# Patient Record
Sex: Female | Born: 1960 | Race: Black or African American | Hispanic: No | Marital: Married | Smoking: Never smoker
Health system: Southern US, Community
[De-identification: ages and names within clinical notes are randomized; demographics above are authoritative.]

## PROBLEM LIST (undated history)

## (undated) DIAGNOSIS — J309 Allergic rhinitis, unspecified: Secondary | ICD-10-CM

## (undated) DIAGNOSIS — Z8632 Personal history of gestational diabetes: Secondary | ICD-10-CM

## (undated) DIAGNOSIS — N841 Polyp of cervix uteri: Secondary | ICD-10-CM

## (undated) DIAGNOSIS — Z973 Presence of spectacles and contact lenses: Secondary | ICD-10-CM

## (undated) DIAGNOSIS — E119 Type 2 diabetes mellitus without complications: Secondary | ICD-10-CM

## (undated) DIAGNOSIS — D649 Anemia, unspecified: Secondary | ICD-10-CM

## (undated) DIAGNOSIS — D259 Leiomyoma of uterus, unspecified: Secondary | ICD-10-CM

## (undated) DIAGNOSIS — I10 Essential (primary) hypertension: Secondary | ICD-10-CM

## (undated) DIAGNOSIS — T7840XA Allergy, unspecified, initial encounter: Secondary | ICD-10-CM

## (undated) DIAGNOSIS — E785 Hyperlipidemia, unspecified: Secondary | ICD-10-CM

## (undated) DIAGNOSIS — O24419 Gestational diabetes mellitus in pregnancy, unspecified control: Secondary | ICD-10-CM

## (undated) HISTORY — DX: Hyperlipidemia, unspecified: E78.5

## (undated) HISTORY — DX: Leiomyoma of uterus, unspecified: D25.9

## (undated) HISTORY — DX: Allergy, unspecified, initial encounter: T78.40XA

## (undated) HISTORY — DX: Essential (primary) hypertension: I10

## (undated) HISTORY — DX: Type 2 diabetes mellitus without complications: E11.9

## (undated) HISTORY — DX: Anemia, unspecified: D64.9

## (undated) HISTORY — PX: LUMBAR SPINE SURGERY: SHX701

## (undated) HISTORY — DX: Gestational diabetes mellitus in pregnancy, unspecified control: O24.419

## (undated) HISTORY — PX: LIPOMA EXCISION: SHX5283

## (undated) HISTORY — PX: BREAST SURGERY: SHX581

## (undated) HISTORY — PX: SPINE SURGERY: SHX786

---
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
## 1988-01-15 HISTORY — PX: LIPOMA EXCISION: SHX5283

## 1990-01-14 HISTORY — PX: LUMBAR SPINE SURGERY: SHX701

## 1998-11-27 ENCOUNTER — Other Ambulatory Visit: Admission: RE | Admit: 1998-11-27 | Discharge: 1998-11-27 | Payer: Self-pay | Admitting: Obstetrics and Gynecology

## 2000-09-19 ENCOUNTER — Encounter (INDEPENDENT_AMBULATORY_CARE_PROVIDER_SITE_OTHER): Payer: Self-pay | Admitting: *Deleted

## 2000-09-19 ENCOUNTER — Ambulatory Visit (HOSPITAL_BASED_OUTPATIENT_CLINIC_OR_DEPARTMENT_OTHER): Admission: RE | Admit: 2000-09-19 | Discharge: 2000-09-19 | Payer: Self-pay | Admitting: General Surgery

## 2001-08-07 ENCOUNTER — Other Ambulatory Visit: Admission: RE | Admit: 2001-08-07 | Discharge: 2001-08-07 | Payer: Self-pay | Admitting: Obstetrics and Gynecology

## 2003-01-18 ENCOUNTER — Other Ambulatory Visit: Admission: RE | Admit: 2003-01-18 | Discharge: 2003-01-18 | Payer: Self-pay | Admitting: Obstetrics and Gynecology

## 2003-05-16 ENCOUNTER — Encounter: Admission: RE | Admit: 2003-05-16 | Discharge: 2003-05-16 | Payer: Self-pay | Admitting: Obstetrics and Gynecology

## 2004-05-17 ENCOUNTER — Other Ambulatory Visit: Admission: RE | Admit: 2004-05-17 | Discharge: 2004-05-17 | Payer: Self-pay | Admitting: Obstetrics and Gynecology

## 2005-01-25 ENCOUNTER — Emergency Department (HOSPITAL_COMMUNITY): Admission: EM | Admit: 2005-01-25 | Discharge: 2005-01-25 | Payer: Self-pay

## 2006-09-19 ENCOUNTER — Encounter: Admission: RE | Admit: 2006-09-19 | Discharge: 2006-09-19 | Payer: Self-pay | Admitting: Internal Medicine

## 2011-02-01 ENCOUNTER — Encounter: Payer: Self-pay | Admitting: Physician Assistant

## 2011-02-01 DIAGNOSIS — D259 Leiomyoma of uterus, unspecified: Secondary | ICD-10-CM | POA: Insufficient documentation

## 2011-02-01 DIAGNOSIS — E669 Obesity, unspecified: Secondary | ICD-10-CM | POA: Insufficient documentation

## 2011-02-01 DIAGNOSIS — E785 Hyperlipidemia, unspecified: Secondary | ICD-10-CM | POA: Insufficient documentation

## 2011-02-01 DIAGNOSIS — E119 Type 2 diabetes mellitus without complications: Secondary | ICD-10-CM | POA: Insufficient documentation

## 2011-02-01 DIAGNOSIS — I10 Essential (primary) hypertension: Secondary | ICD-10-CM

## 2011-02-14 ENCOUNTER — Other Ambulatory Visit: Payer: Self-pay | Admitting: Physician Assistant

## 2011-02-25 ENCOUNTER — Other Ambulatory Visit: Payer: Self-pay | Admitting: Physician Assistant

## 2011-03-19 ENCOUNTER — Other Ambulatory Visit: Payer: Self-pay | Admitting: Physician Assistant

## 2011-04-25 ENCOUNTER — Ambulatory Visit: Payer: Self-pay | Admitting: Physician Assistant

## 2011-04-26 ENCOUNTER — Telehealth: Payer: Self-pay

## 2011-04-26 DIAGNOSIS — N63 Unspecified lump in unspecified breast: Secondary | ICD-10-CM

## 2011-04-26 NOTE — Telephone Encounter (Signed)
Chelle, do you want to change this to a referral for Dx mammogram/US? Pt's chart is in your box

## 2011-04-26 NOTE — Telephone Encounter (Signed)
Revonda Standard from [facility] states that pt is stating that about a month ago she discovered a lump in her breast, they now would like for [place] to change the current order for screening into a order for a diagnostic with ultrasound. [phone]  [phone]

## 2011-04-29 NOTE — Telephone Encounter (Signed)
Yes, ok to change to diagnostic mammo with [place].

## 2011-04-29 NOTE — Telephone Encounter (Signed)
Spoke with Stephanie Nunez-told her it was ok to change the order per Chelle.

## 2011-05-11 ENCOUNTER — Other Ambulatory Visit: Payer: Self-pay | Admitting: Physician Assistant

## 2011-05-18 ENCOUNTER — Other Ambulatory Visit: Payer: Self-pay | Admitting: Physician Assistant

## 2011-05-24 ENCOUNTER — Other Ambulatory Visit: Payer: Self-pay | Admitting: Physician Assistant

## 2011-05-25 ENCOUNTER — Other Ambulatory Visit: Payer: Self-pay | Admitting: Physician Assistant

## 2011-05-30 ENCOUNTER — Ambulatory Visit: Payer: Self-pay | Admitting: Physician Assistant

## 2011-06-13 ENCOUNTER — Ambulatory Visit: Payer: Self-pay | Admitting: Physician Assistant

## 2011-06-29 ENCOUNTER — Other Ambulatory Visit: Payer: Self-pay | Admitting: Physician Assistant

## 2011-07-11 ENCOUNTER — Encounter: Payer: Self-pay | Admitting: Physician Assistant

## 2011-07-11 ENCOUNTER — Ambulatory Visit (INDEPENDENT_AMBULATORY_CARE_PROVIDER_SITE_OTHER): Payer: 59 | Admitting: Physician Assistant

## 2011-07-11 VITALS — BP 133/89 | HR 80 | Temp 97.9°F | Resp 18 | Ht 63.0 in | Wt 186.6 lb

## 2011-07-11 DIAGNOSIS — J309 Allergic rhinitis, unspecified: Secondary | ICD-10-CM

## 2011-07-11 DIAGNOSIS — E785 Hyperlipidemia, unspecified: Secondary | ICD-10-CM

## 2011-07-11 DIAGNOSIS — E669 Obesity, unspecified: Secondary | ICD-10-CM

## 2011-07-11 DIAGNOSIS — I10 Essential (primary) hypertension: Secondary | ICD-10-CM

## 2011-07-11 DIAGNOSIS — Z79899 Other long term (current) drug therapy: Secondary | ICD-10-CM

## 2011-07-11 DIAGNOSIS — E119 Type 2 diabetes mellitus without complications: Secondary | ICD-10-CM

## 2011-07-11 DIAGNOSIS — E782 Mixed hyperlipidemia: Secondary | ICD-10-CM

## 2011-07-11 LAB — LIPID PANEL
Cholesterol: 166 mg/dL (ref 0–200)
LDL Cholesterol: 99 mg/dL (ref 0–99)
VLDL: 20 mg/dL (ref 0–40)

## 2011-07-11 LAB — COMPREHENSIVE METABOLIC PANEL
Chloride: 102 mEq/L (ref 96–112)
Creat: 0.56 mg/dL (ref 0.50–1.10)
Glucose, Bld: 75 mg/dL (ref 70–99)
Sodium: 138 mEq/L (ref 135–145)

## 2011-07-11 MED ORDER — LISINOPRIL-HYDROCHLOROTHIAZIDE 20-12.5 MG PO TABS: 1.0000 | ORAL_TABLET | Freq: Every day | ORAL | Status: DC

## 2011-07-11 MED ORDER — AZELASTINE HCL 0.1 % NA SOLN: 2.0000 | Freq: Two times a day (BID) | NASAL | Status: DC

## 2011-07-11 MED ORDER — FLUTICASONE PROPIONATE 50 MCG/ACT NA SUSP: 2.0000 | Freq: Every day | NASAL | Status: DC

## 2011-07-11 NOTE — Assessment & Plan Note (Signed)
Controlled. Continue current regimen. Reassess in 6 months.

## 2011-07-11 NOTE — Patient Instructions (Addendum)
I will contact you with your lab results as soon as they are available.  If you have not heard from me in 2 weeks, please contact me. Keep up the great work, and return in the next 6 months for a complete physical and fasting labs.  Keeping You Healthy  Get These Tests  Blood Pressure- Have your blood pressure checked by your healthcare provider at least once a year.  Normal blood pressure is 120/80.  Weight- Have your body mass index (BMI) calculated to screen for obesity.  BMI is a measure of body fat based on height and weight.  You can calculate your own BMI at https://www.west-esparza.com/  Cholesterol- Have your cholesterol checked every year.  Diabetes- Have your blood sugar checked every year if you have high blood pressure, high cholesterol, a family history of diabetes or if you are overweight.  Pap Smear- Have a pap smear every 1 to 3 years if you have been sexually active.  If you are older than 65 and recent pap smears have been normal you may not need additional pap smears.  In addition, if you have had a hysterectomy  For benign disease additional pap smears are not necessary.  Mammogram-Yearly mammograms are essential for early detection of breast cancer  Screening for Colon Cancer- Colonoscopy starting at age 44. Screening may begin sooner depending on your family history and other health conditions.  Follow up colonoscopy as directed by your Gastroenterologist.  Screening for Osteoporosis- Screening begins at age 48 with bone density scanning, sooner if you are at higher risk for developing Osteoporosis.  Get these medicines  Calcium with Vitamin D- Your body requires 1200-1500 mg of Calcium a day and [phone] IU of Vitamin D a day.  You can only absorb 500 mg of Calcium at a time therefore Calcium must be taken in 2 or 3 separate doses throughout the day.  Hormones- Hormone therapy has been associated with increased risk for certain cancers and heart disease.  Talk to your  healthcare provider about if you need relief from menopausal symptoms.  Aspirin- Ask your healthcare provider about taking Aspirin to prevent Heart Disease and Stroke.  Get these Immuniztions  Flu shot- Every fall  Pneumonia shot- Once after the age of 76; if you are younger ask your healthcare provider if you need a pneumonia shot.  Tetanus- Every ten years.  Zostavax- Once after the age of 40 to prevent shingles.  Take these steps  Don't smoke- Your healthcare provider can help you quit. For tips on how to quit, ask your healthcare provider or go to www.smokefree.gov or call 1-800 QUIT-NOW.  Be physically active- Exercise 5 days a week for a minimum of 30 minutes.  If you are not already physically active, start slow and gradually work up to 30 minutes of moderate physical activity.  Try walking, dancing, bike riding, swimming, etc.  Eat a healthy diet- Eat a variety of healthy foods such as fruits, vegetables, whole grains, low fat milk, low fat cheeses, yogurt, lean meats, chicken, fish, eggs, dried beans, tofu, etc.  For more information go to www.thenutritionsource.org  Dental visit- Brush and floss teeth twice daily; visit your dentist twice a year.  Eye exam- Visit your Optometrist or Ophthalmologist yearly.  Drink alcohol in moderation- Limit alcohol intake to one drink or less a day.  Never drink and drive.  Depression- Your emotional health is as important as your physical health.  If you're feeling down or losing interest in things  you normally enjoy, please talk to your healthcare provider.  Seat Belts- can save your life; always wear one  Smoke/Carbon Monoxide detectors- These detectors need to be installed on the appropriate level of your home.  Replace batteries at least once a year.  Violence- If anyone is threatening or hurting you, please tell your healthcare provider.  Living Will/ Health care power of attorney- Discuss with your healthcare provider and  family.

## 2011-07-11 NOTE — Assessment & Plan Note (Signed)
Non-fasting today. Continue current regimen.  Reassess fasting labs in 6 months.

## 2011-07-11 NOTE — Assessment & Plan Note (Signed)
Continue efforts for weight loss through healthy lifestyle modification.

## 2011-07-11 NOTE — Assessment & Plan Note (Signed)
Controlled. Continue current treatment and lifestyle modification.

## 2011-07-11 NOTE — Progress Notes (Signed)
  Subjective:    Patient ID: Stephanie Nunez, female    DOB: [birth year], 51 y.o.   MRN: [id number]  HPI Presents for re-evaluation of HTN, hyperlipidemia and DM type 2.  Allergies are stable.  We'd planned a CPE today, but she had to reschedule, and in doing so the purpose of the visit was lost.  Doesn't routinely check her blood sugar.  Last eye exam was in March.  Dental exam coming up.   Review of Systems No chest pain, SOB, HA, dizziness, vision change, N/V, diarrhea, constipation, dysuria, urinary urgency or frequency, myalgias, arthralgias or rash.     Objective:   Physical Exam Vital signs noted. Well-developed, well nourished BF who is awake, alert and oriented, in NAD. HEENT: Tripp/AT, sclera and conjunctiva are clear.   Neck: supple, non-tender, no lymphadenopathy, thyromegaly. Heart: RRR, no murmur Lungs: CTA Extremities: no cyanosis, clubbing or edema. Skin: warm and dry without rash.        Assessment & Plan:

## 2011-07-13 ENCOUNTER — Encounter: Payer: Self-pay | Admitting: Physician Assistant

## 2011-08-12 ENCOUNTER — Other Ambulatory Visit: Payer: Self-pay | Admitting: Physician Assistant

## 2011-08-14 ENCOUNTER — Other Ambulatory Visit: Payer: Self-pay | Admitting: Physician Assistant

## 2011-10-31 ENCOUNTER — Ambulatory Visit (INDEPENDENT_AMBULATORY_CARE_PROVIDER_SITE_OTHER): Payer: 59

## 2011-10-31 DIAGNOSIS — Z23 Encounter for immunization: Secondary | ICD-10-CM

## 2011-11-06 ENCOUNTER — Telehealth: Payer: Self-pay

## 2011-11-06 MED ORDER — NIACIN 250 MG PO TABS: 500.0000 mg | ORAL_TABLET | Freq: Two times a day (BID) | ORAL | Status: DC

## 2011-11-06 NOTE — Telephone Encounter (Signed)
Generic niacin 250mg  has been sent to her pharmacy.  She will need to take 2 pills twice per day because of the dosing of the generic.  Per her last OV 07/11/11, she is due for labs in December, can discuss medication with her provider at that time.

## 2011-11-06 NOTE — Telephone Encounter (Signed)
I have advised patient 

## 2011-11-06 NOTE — Telephone Encounter (Signed)
The patient called to request that her rx for SLO-Niacin be changed at her pharmacy to the generic Niacin that is now available.  Please call the patient at [phone].

## 2011-11-16 ENCOUNTER — Other Ambulatory Visit: Payer: Self-pay | Admitting: Physician Assistant

## 2011-12-30 ENCOUNTER — Other Ambulatory Visit: Payer: Self-pay | Admitting: Physician Assistant

## 2011-12-30 NOTE — Telephone Encounter (Signed)
Needs ov/labs 

## 2012-01-05 ENCOUNTER — Telehealth: Payer: Self-pay | Admitting: *Deleted

## 2012-01-05 NOTE — Telephone Encounter (Signed)
Pharmacy requesting #180 generic form Niaspan 750mg  2 tabs at bedtime.  She was previously taking over the counter Slo-Niacin 750mg .  Niaspan is now available generically and is covered by her ins.  They would like a new rx.

## 2012-01-06 MED ORDER — NIACIN ER (ANTIHYPERLIPIDEMIC) 750 MG PO TBCR: 1500.0000 mg | EXTENDED_RELEASE_TABLET | Freq: Every day | ORAL | Status: DC

## 2012-01-06 NOTE — Telephone Encounter (Signed)
Rx sent 

## 2012-01-08 ENCOUNTER — Other Ambulatory Visit: Payer: Self-pay | Admitting: Physician Assistant

## 2012-01-16 ENCOUNTER — Encounter: Payer: 59 | Admitting: Physician Assistant

## 2012-02-09 ENCOUNTER — Other Ambulatory Visit: Payer: Self-pay | Admitting: Physician Assistant

## 2012-02-14 ENCOUNTER — Other Ambulatory Visit: Payer: Self-pay | Admitting: Physician Assistant

## 2012-02-27 ENCOUNTER — Encounter: Payer: 59 | Admitting: Physician Assistant

## 2012-04-05 ENCOUNTER — Other Ambulatory Visit: Payer: Self-pay | Admitting: Physician Assistant

## 2012-04-15 ENCOUNTER — Telehealth: Payer: Self-pay

## 2012-04-15 MED ORDER — LEVONORGESTREL 0.75 MG PO TABS: 0.7500 mg | ORAL_TABLET | Freq: Two times a day (BID) | ORAL | Status: DC

## 2012-04-15 NOTE — Telephone Encounter (Signed)
Patient advised.

## 2012-04-15 NOTE — Telephone Encounter (Signed)
This does not need to be called in, advised her to walk in to the pharmacy . She states she wants her insurance to pay for this so she wants it sent to pharmacy, please advise. Pended

## 2012-04-15 NOTE — Telephone Encounter (Signed)
Sent!

## 2012-04-15 NOTE — Telephone Encounter (Signed)
Pt would like for plan b to be called into [facility] in [place]. Best# [phone]

## 2012-04-23 ENCOUNTER — Encounter: Payer: Self-pay | Admitting: Physician Assistant

## 2012-04-23 ENCOUNTER — Ambulatory Visit (INDEPENDENT_AMBULATORY_CARE_PROVIDER_SITE_OTHER): Payer: BC Managed Care – PPO | Admitting: Physician Assistant

## 2012-04-23 VITALS — BP 110/72 | HR 81 | Temp 99.2°F | Resp 16 | Ht 62.5 in | Wt 184.8 lb

## 2012-04-23 DIAGNOSIS — I10 Essential (primary) hypertension: Secondary | ICD-10-CM

## 2012-04-23 DIAGNOSIS — Z23 Encounter for immunization: Secondary | ICD-10-CM

## 2012-04-23 DIAGNOSIS — Z0000 Encounter for general adult medical examination without abnormal findings: Secondary | ICD-10-CM

## 2012-04-23 DIAGNOSIS — E669 Obesity, unspecified: Secondary | ICD-10-CM

## 2012-04-23 DIAGNOSIS — Z1239 Encounter for other screening for malignant neoplasm of breast: Secondary | ICD-10-CM

## 2012-04-23 DIAGNOSIS — E119 Type 2 diabetes mellitus without complications: Secondary | ICD-10-CM

## 2012-04-23 DIAGNOSIS — Z1159 Encounter for screening for other viral diseases: Secondary | ICD-10-CM

## 2012-04-23 DIAGNOSIS — E785 Hyperlipidemia, unspecified: Secondary | ICD-10-CM

## 2012-04-23 DIAGNOSIS — Z1211 Encounter for screening for malignant neoplasm of colon: Secondary | ICD-10-CM

## 2012-04-23 LAB — CBC WITH DIFFERENTIAL/PLATELET
Eosinophils Absolute: 0.1 10*3/uL (ref 0.0–0.7)
Eosinophils Relative: 3 % (ref 0–5)
HCT: 34.4 % — ABNORMAL LOW (ref 36.0–46.0)
Hemoglobin: 11.6 g/dL — ABNORMAL LOW (ref 12.0–15.0)
Lymphocytes Relative: 47 % — ABNORMAL HIGH (ref 12–46)
Lymphs Abs: 2.3 10*3/uL (ref 0.7–4.0)
MCH: 28 pg (ref 26.0–34.0)
MCV: 82.9 fL (ref 78.0–100.0)
Monocytes Absolute: 0.5 10*3/uL (ref 0.1–1.0)
Monocytes Relative: 10 % (ref 3–12)
RBC: 4.15 MIL/uL (ref 3.87–5.11)
WBC: 4.9 10*3/uL (ref 4.0–10.5)

## 2012-04-23 LAB — POCT URINALYSIS DIPSTICK
Bilirubin, UA: NEGATIVE
Blood, UA: NEGATIVE
Glucose, UA: NEGATIVE
Ketones, UA: NEGATIVE
Spec Grav, UA: 1.015
Urobilinogen, UA: 0.2

## 2012-04-23 LAB — TSH: TSH: 0.858 u[IU]/mL (ref 0.350–4.500)

## 2012-04-23 LAB — COMPREHENSIVE METABOLIC PANEL
ALT: 21 U/L (ref 0–35)
CO2: 26 mEq/L (ref 19–32)
Calcium: 9.1 mg/dL (ref 8.4–10.5)
Chloride: 95 mEq/L — ABNORMAL LOW (ref 96–112)
Creat: 0.57 mg/dL (ref 0.50–1.10)
Glucose, Bld: 87 mg/dL (ref 70–99)
Total Bilirubin: 0.5 mg/dL (ref 0.3–1.2)

## 2012-04-23 LAB — LIPID PANEL
Cholesterol: 180 mg/dL (ref 0–200)
HDL: 37 mg/dL — ABNORMAL LOW (ref >39)
Total CHOL/HDL Ratio: 4.9 Ratio

## 2012-04-23 LAB — HEPATITIS C ANTIBODY: HCV Ab: NEGATIVE

## 2012-04-23 LAB — POCT UA - MICROSCOPIC ONLY
Bacteria, U Microscopic: NEGATIVE
Casts, Ur, LPF, POC: NEGATIVE
Mucus, UA: NEGATIVE

## 2012-04-23 LAB — GLUCOSE, POCT (MANUAL RESULT ENTRY): POC Glucose: 103 mg/dl — AB (ref 70–99)

## 2012-04-23 MED ORDER — ZOSTER VACCINE LIVE 19400 UNT/0.65ML ~~LOC~~ SOLR: 0.6500 mL | Freq: Once | SUBCUTANEOUS | Status: DC

## 2012-04-23 NOTE — Patient Instructions (Addendum)

## 2012-04-23 NOTE — Progress Notes (Signed)
Subjective:    Patient ID: Stephanie Nunez, female    DOB: [birth year], 52 y.o.   MRN: [id number]  HPI This 52 y.o. female presents for CPE.  Patient Active Problem List  Diagnosis  . HTN (hypertension)  . Hyperlipemia  . Obesity (BMI 30-39.9)  . DM type 2 (diabetes mellitus, type 2)  . DM, gestational, diet controlled   Normal pap and negative HPV in 2010.  Last mammogram 2012.  Has not yet had a colonoscopy, shingles vaccine.  Past Medical History  Diagnosis Date  . Gestational diabetes mellitus   . Uterine fibroid   . Diabetes type 2, controlled   . Hyperlipidemia   . Hypertension   . Allergy     Past Surgical History  Procedure Laterality Date  . Lumbar spine surgery      L4-5  . Lipoma excision      Left chest wall    Prior to Admission medications   Medication Sig Start Date End Date Taking? Authorizing Provider  aspirin 81 MG tablet Take 81 mg by mouth daily.   Yes Historical Provider, MD  azelastine (ASTELIN) 137 MCG/SPRAY nasal spray Place 2 sprays into the nose 2 (two) times daily. Use in each nostril as directed 07/11/11  Yes Lexis Potenza S Enma Maeda, PA-C  fluticasone (FLONASE) 50 MCG/ACT nasal spray Place 2 sprays into the nose daily. 07/11/11  Yes Lawanna Cecere S Veralyn Lopp, PA-C  lisinopril-hydrochlorothiazide (PRINZIDE,ZESTORETIC) 20-12.5 MG per tablet Take 1 tablet by mouth daily. DUE FOR OFFICE VISIT, LABS 12/30/11  Yes Godfrey Pick, PA-C  lisinopril-hydrochlorothiazide (PRINZIDE,ZESTORETIC) 20-12.5 MG per tablet TAKE 1 TABLET BY MOUTH EVERY DAY 04/05/12  Yes Morrell Riddle, PA-C  metFORMIN (GLUCOPHAGE) 500 MG tablet Take 1 tablet (500 mg total) by mouth 2 (two) times daily with a meal. NEEDS OFFICE VISIT/LABS FOR MORE 05/24/11  Yes Anum Palecek S Beatriz Settles, PA-C  metFORMIN (GLUCOPHAGE) 500 MG tablet TAKE 1 TABLET BY MOUTH TWICE DAILY 02/09/12  Yes Heather M Marte, PA-C  niacin (NIASPAN) 750 MG CR tablet Take 2 tablets (1,500 mg total) by mouth at bedtime. 01/06/12  Yes Maili Shutters Tessa Lerner, PA-C    Allergies  Allergen Reactions  . Simvastatin     myalgia    History   Social History  . Marital Status: Married    Spouse Name: Thayer Ohm    Number of Children: 1  . Years of Education: 16   Occupational History  . homemaker    Social History Main Topics  . Smoking status: Never Smoker   . Smokeless tobacco: Never Used  . Alcohol Use: No  . Drug Use: No  . Sexually Active: Yes -- Female partner(s)    Birth Control/ Protection: Condom   Other Topics Concern  . Not on file   Social History Narrative   Lives with her husband and their daughter.  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    Family History  Problem Relation Age of Onset  . Stroke Maternal Grandmother   . Stroke Paternal Grandmother   . Cancer Father     pancreatic  . Diabetes Sister   . Hypertension Sister   . Hypertension Sister   . Hypertension Brother       Review of Systems  Constitutional: Negative.   HENT: Negative.   Eyes: Negative.   Respiratory: Negative.   Cardiovascular: Negative.   Gastrointestinal: Negative.   Genitourinary: Negative.   Musculoskeletal: Negative.   Skin: Negative.   Neurological: Negative.   Psychiatric/Behavioral:  Negative.        Objective:   Physical Exam  Vitals reviewed. Constitutional: She is oriented to person, place, and time. Vital signs are normal. She appears well-developed and well-nourished. She is active and cooperative. No distress.  HENT:  Head: Normocephalic and atraumatic.  Right Ear: Hearing, tympanic membrane, external ear and ear canal normal. No foreign bodies.  Left Ear: Hearing, tympanic membrane, external ear and ear canal normal. No foreign bodies.  Nose: Nose normal.  Mouth/Throat: Uvula is midline, oropharynx is clear and moist and mucous membranes are normal. No oral lesions. Normal dentition. No dental abscesses or edematous. No oropharyngeal exudate.  Eyes: Conjunctivae, EOM and lids are normal. Pupils  are equal, round, and reactive to light. Right eye exhibits no discharge. Left eye exhibits no discharge. No scleral icterus.  Fundoscopic exam:      The right eye shows no arteriolar narrowing, no AV nicking, no exudate, no hemorrhage and no papilledema.       The left eye shows no arteriolar narrowing, no AV nicking, no exudate, no hemorrhage and no papilledema.  Neck: Trachea normal, normal range of motion and full passive range of motion without pain. Neck supple. No spinous process tenderness and no muscular tenderness present. No mass and no thyromegaly present.  Cardiovascular: Normal rate, regular rhythm, normal heart sounds, intact distal pulses and normal pulses.   Pulmonary/Chest: Effort normal and breath sounds normal. She exhibits no tenderness and no retraction. Right breast exhibits no inverted nipple, no mass, no nipple discharge, no skin change and no tenderness. Left breast exhibits no inverted nipple, no mass, no nipple discharge, no skin change and no tenderness. Breasts are symmetrical.  Abdominal: Soft. Normal appearance and bowel sounds are normal. She exhibits no distension and no mass. There is no hepatosplenomegaly. There is no tenderness. There is no rigidity, no rebound, no guarding, no CVA tenderness, no tenderness at McBurney's point and negative Murphy's sign. No hernia.  Genitourinary: Vagina normal. No breast swelling, tenderness, discharge or bleeding.  Musculoskeletal: She exhibits no edema and no tenderness.       Cervical back: Normal.       Thoracic back: Normal.       Lumbar back: Normal.  Lymphadenopathy:       Head (right side): No tonsillar, no preauricular, no posterior auricular and no occipital adenopathy present.       Head (left side): No tonsillar, no preauricular, no posterior auricular and no occipital adenopathy present.    She has no cervical adenopathy.    She has no axillary adenopathy.       Right: No supraclavicular adenopathy present.        Left: No supraclavicular adenopathy present.  Neurological: She is alert and oriented to person, place, and time. She has normal strength and normal reflexes. No cranial nerve deficit. She exhibits normal muscle tone. Coordination and gait normal.  Skin: Skin is warm, dry and intact. No rash noted. She is not diaphoretic. No cyanosis or erythema. Nails show no clubbing.  Psychiatric: She has a normal mood and affect. Her speech is normal and behavior is normal. Judgment and thought content normal.      Results for orders placed in visit on 04/23/12  GLUCOSE, POCT (MANUAL RESULT ENTRY)      Result Value Range   POC Glucose 103 (*) 70 - 99 mg/dl  POCT GLYCOSYLATED HEMOGLOBIN (HGB A1C)      Result Value Range   Hemoglobin A1C 6.4  POCT UA - MICROSCOPIC ONLY      Result Value Range   WBC, Ur, HPF, POC neg     RBC, urine, microscopic 0-1     Bacteria, U Microscopic neg     Mucus, UA neg     Epithelial cells, urine per micros 0-3     Crystals, Ur, HPF, POC neg     Casts, Ur, LPF, POC neg     Yeast, UA neg    POCT URINALYSIS DIPSTICK      Result Value Range   Color, UA yellow     Clarity, UA clear     Glucose, UA neg     Bilirubin, UA neg     Ketones, UA neg     Spec Grav, UA 1.015     Blood, UA neg     pH, UA 8.0     Protein, UA neg     Urobilinogen, UA 0.2     Nitrite, UA neg     Leukocytes, UA Negative         Assessment & Plan:  Routine general medical examination at a health care facility - Plan: POCT UA - Microscopic Only, POCT urinalysis dipstick, TSH; Age appropriate anticipatory guidance provided.  DM type 2 (diabetes mellitus, type 2) - controlled. Plan: POCT glucose (manual entry), POCT glycosylated hemoglobin (Hb A1C); continue current treatment.  HTN (hypertension) - controlled. Plan: CBC with Differential, Comprehensive metabolic panel; continue current treatment.  Hyperlipemia - Plan: Lipid panel  Obesity (BMI 30-39.9) - Plan: continue efforts for weight  loss through healthy eating and regular exercise.  Need for hepatitis C screening test - Plan: Hepatitis C antibody  Screening for colon cancer - Plan: Ambulatory referral to Gastroenterology  Screening for breast cancer - Plan: MM Digital Screening  Need for shingles vaccine - Plan: zoster vaccine live, PF, (ZOSTAVAX) 45409 UNT/0.65ML injection  Fernande Bras, PA-C Physician Assistant-Certified [facility]

## 2012-04-27 ENCOUNTER — Encounter: Payer: Self-pay | Admitting: Physician Assistant

## 2012-05-08 ENCOUNTER — Telehealth: Payer: Self-pay

## 2012-05-08 MED ORDER — METFORMIN HCL 500 MG PO TABS: 500.0000 mg | ORAL_TABLET | Freq: Two times a day (BID) | ORAL | Status: DC

## 2012-05-08 NOTE — Telephone Encounter (Signed)
Sent in

## 2012-05-08 NOTE — Telephone Encounter (Signed)
Patient would like a refill on her metphormin but needs it to go to [facility] [place]

## 2012-05-11 ENCOUNTER — Other Ambulatory Visit: Payer: Self-pay | Admitting: Physician Assistant

## 2012-05-18 ENCOUNTER — Encounter: Payer: Self-pay | Admitting: Physician Assistant

## 2012-07-06 ENCOUNTER — Other Ambulatory Visit: Payer: Self-pay | Admitting: Physician Assistant

## 2012-08-12 ENCOUNTER — Other Ambulatory Visit: Payer: Self-pay | Admitting: Physician Assistant

## 2012-09-04 ENCOUNTER — Other Ambulatory Visit: Payer: Self-pay | Admitting: Physician Assistant

## 2012-10-04 ENCOUNTER — Other Ambulatory Visit: Payer: Self-pay | Admitting: Physician Assistant

## 2012-10-18 ENCOUNTER — Ambulatory Visit (INDEPENDENT_AMBULATORY_CARE_PROVIDER_SITE_OTHER): Payer: BC Managed Care – PPO | Admitting: *Deleted

## 2012-10-18 DIAGNOSIS — Z23 Encounter for immunization: Secondary | ICD-10-CM

## 2012-12-21 ENCOUNTER — Encounter: Payer: Self-pay | Admitting: Physician Assistant

## 2012-12-31 ENCOUNTER — Other Ambulatory Visit: Payer: Self-pay | Admitting: Physician Assistant

## 2013-01-09 ENCOUNTER — Other Ambulatory Visit: Payer: Self-pay | Admitting: Physician Assistant

## 2013-01-12 ENCOUNTER — Other Ambulatory Visit: Payer: Self-pay | Admitting: Physician Assistant

## 2013-01-13 ENCOUNTER — Other Ambulatory Visit: Payer: Self-pay | Admitting: Physician Assistant

## 2013-01-28 ENCOUNTER — Ambulatory Visit: Payer: BC Managed Care – PPO | Admitting: Physician Assistant

## 2013-02-09 ENCOUNTER — Other Ambulatory Visit: Payer: Self-pay | Admitting: Physician Assistant

## 2013-02-11 ENCOUNTER — Ambulatory Visit (INDEPENDENT_AMBULATORY_CARE_PROVIDER_SITE_OTHER): Payer: BC Managed Care – PPO | Admitting: Physician Assistant

## 2013-02-11 ENCOUNTER — Other Ambulatory Visit: Payer: Self-pay | Admitting: Physician Assistant

## 2013-02-11 VITALS — BP 116/74 | HR 83 | Temp 98.7°F | Resp 16 | Ht 63.0 in | Wt 192.0 lb

## 2013-02-11 DIAGNOSIS — E669 Obesity, unspecified: Secondary | ICD-10-CM

## 2013-02-11 DIAGNOSIS — E119 Type 2 diabetes mellitus without complications: Secondary | ICD-10-CM

## 2013-02-11 DIAGNOSIS — I10 Essential (primary) hypertension: Secondary | ICD-10-CM

## 2013-02-11 DIAGNOSIS — E785 Hyperlipidemia, unspecified: Secondary | ICD-10-CM

## 2013-02-11 LAB — POCT GLYCOSYLATED HEMOGLOBIN (HGB A1C): Hemoglobin A1C: 7.1

## 2013-02-11 LAB — GLUCOSE, POCT (MANUAL RESULT ENTRY): POC Glucose: 119 mg/dl — AB (ref 70–99)

## 2013-02-11 MED ORDER — LISINOPRIL-HYDROCHLOROTHIAZIDE 20-12.5 MG PO TABS: 1.0000 | ORAL_TABLET | Freq: Every day | ORAL | Status: DC

## 2013-02-11 NOTE — Progress Notes (Signed)
   Subjective:    Patient ID: Stephanie Nunez, female    DOB: [birth year], 53 y.o.   MRN: [id number]  HPI  Patient is here for follow up of DM, HTN and hyperlipidemia. She has no complaints today. 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   Pt reports BP is well controlled and is tolerating medication well. Does not check BP at home. Denies visual changes, HA and lightheadedness.   Pt reports DM is well controlled and is still tolerating medication well. Does not check glucose at home. No episodes of hypoglycemia. Is trying to get diet back on track. Walks a lot during the day at school but has not been regularly exercising like she had been previously.   Continues medication for hyperlipidemia and is tolerating it well.    Review of Systems  Constitutional: Negative for fever and chills.  Respiratory: Negative for shortness of breath.   Cardiovascular: Negative for chest pain and palpitations.  Gastrointestinal: Negative for nausea, vomiting, diarrhea, constipation and blood in stool.  Genitourinary: Negative for dysuria and hematuria.  Musculoskeletal: Negative for arthralgias, back pain and myalgias.       Objective:   Physical Exam  Constitutional: She is oriented to person, place, and time. She appears well-developed and well-nourished.  HENT:  Head: Normocephalic and atraumatic.  Eyes: Conjunctivae are normal. Pupils are equal, round, and reactive to light.  Neck: Normal range of motion. Neck supple.  Cardiovascular: Normal rate, regular rhythm, normal heart sounds and intact distal pulses.   Pulmonary/Chest: Effort normal and breath sounds normal.  Lymphadenopathy:    She has no cervical adenopathy.       Right: No supraclavicular adenopathy present.       Left: No supraclavicular adenopathy present.  Neurological: She is alert and oriented to person, place, and time.  Skin: Skin is warm and dry.  Psychiatric: She has a normal mood and  affect. Her behavior is normal. Judgment and thought content normal.     See diabetic foot exam.      Assessment & Plan:     1. DM type 2 (diabetes mellitus, type 2) Continue medications as prescribed. Encouraged healthy diet and exercise.  - HM Diabetes Eye Exam - POCT glucose (manual entry) - POCT glycosylated hemoglobin (Hb A1C) - Comprehensive metabolic panel - Microalbumin, urine  2. HTN (hypertension) Continue medications as prescribed. Encouraged diet and exercise.  - lisinopril-hydrochlorothiazide (PRINZIDE,ZESTORETIC) 20-12.5 MG per tablet; Take 1 tablet by mouth daily.  Dispense: 30 tablet; Refill: 3  3. Hyperlipemia Continue medications as prescribed. Encouraged diet and exercise.  - Lipid panel  4. Obesity (BMI 30-39.9)

## 2013-02-11 NOTE — Patient Instructions (Signed)
Keep up the good work-make healthy eating choices and get regular exercise.

## 2013-02-12 ENCOUNTER — Encounter: Payer: Self-pay | Admitting: Physician Assistant

## 2013-02-12 LAB — LIPID PANEL
Cholesterol: 243 mg/dL — ABNORMAL HIGH (ref 0–200)
HDL: 65 mg/dL (ref >39)
LDL CALC: 153 mg/dL — AB (ref 0–99)
Total CHOL/HDL Ratio: 3.7 Ratio
Triglycerides: 124 mg/dL (ref <150)
VLDL: 25 mg/dL (ref 0–40)

## 2013-02-12 LAB — COMPREHENSIVE METABOLIC PANEL
ALBUMIN: 4.5 g/dL (ref 3.5–5.2)
ALK PHOS: 69 U/L (ref 39–117)
ALT: 13 U/L (ref 0–35)
AST: 20 U/L (ref 0–37)
BUN: 8 mg/dL (ref 6–23)
CO2: 28 mEq/L (ref 19–32)
Calcium: 9.3 mg/dL (ref 8.4–10.5)
Chloride: 95 mEq/L — ABNORMAL LOW (ref 96–112)
Creat: 0.67 mg/dL (ref 0.50–1.10)
Glucose, Bld: 116 mg/dL — ABNORMAL HIGH (ref 70–99)
POTASSIUM: 3.6 meq/L (ref 3.5–5.3)
SODIUM: 132 meq/L — AB (ref 135–145)
TOTAL PROTEIN: 7.5 g/dL (ref 6.0–8.3)
Total Bilirubin: 0.5 mg/dL (ref 0.2–1.2)

## 2013-02-12 LAB — MICROALBUMIN, URINE: Microalb, Ur: 0.5 mg/dL (ref 0.00–1.89)

## 2013-02-12 NOTE — Progress Notes (Signed)
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  

## 2013-03-08 ENCOUNTER — Other Ambulatory Visit: Payer: Self-pay | Admitting: Physician Assistant

## 2013-03-10 NOTE — Telephone Encounter (Signed)
Note on lipid results states that [place] wants to know if pt is taking her Chol med every day? If so, she wanted to increase dose since chol has worsened. LMOM for pt to CB.

## 2013-03-11 NOTE — Telephone Encounter (Signed)
LMOM for CB

## 2013-03-12 NOTE — Telephone Encounter (Signed)
Finally reached pt and she reported that she didn't think she had missed that many days, but she did have a period of time that she missed some doses. Pt would like to try one more time to take the med consistently and see if chol is controlled at current dose. I advised her to RTC in 3 mos to recheck chol. Pt agreed. Chelle, FYI and review. Pended Rx for 3 mos.

## 2013-03-13 ENCOUNTER — Other Ambulatory Visit: Payer: Self-pay | Admitting: Physician Assistant

## 2013-03-27 ENCOUNTER — Other Ambulatory Visit: Payer: Self-pay | Admitting: Physician Assistant

## 2013-03-29 NOTE — Telephone Encounter (Signed)
Chelle saw pt for chronic conds and med RFs in Jan but don't see allergies addressed. Can we RF for pt?

## 2013-03-31 ENCOUNTER — Other Ambulatory Visit: Payer: Self-pay | Admitting: Physician Assistant

## 2013-06-11 ENCOUNTER — Other Ambulatory Visit: Payer: Self-pay | Admitting: Physician Assistant

## 2013-06-12 ENCOUNTER — Other Ambulatory Visit: Payer: Self-pay | Admitting: Physician Assistant

## 2013-06-13 ENCOUNTER — Other Ambulatory Visit: Payer: Self-pay | Admitting: Physician Assistant

## 2013-07-12 ENCOUNTER — Other Ambulatory Visit: Payer: Self-pay | Admitting: Physician Assistant

## 2013-07-12 NOTE — Telephone Encounter (Signed)
Spoke to pt- she was transferred to make an appt for follow up labs. Sent in a 30 day supply.

## 2013-07-28 ENCOUNTER — Ambulatory Visit: Payer: BC Managed Care – PPO | Admitting: Family Medicine

## 2013-08-06 ENCOUNTER — Encounter: Payer: Self-pay | Admitting: Family Medicine

## 2013-08-06 ENCOUNTER — Ambulatory Visit (INDEPENDENT_AMBULATORY_CARE_PROVIDER_SITE_OTHER): Payer: BC Managed Care – PPO | Admitting: Family Medicine

## 2013-08-06 VITALS — BP 120/80 | HR 99 | Temp 99.3°F | Resp 18 | Ht 63.0 in | Wt 192.0 lb

## 2013-08-06 DIAGNOSIS — E119 Type 2 diabetes mellitus without complications: Secondary | ICD-10-CM

## 2013-08-06 DIAGNOSIS — I10 Essential (primary) hypertension: Secondary | ICD-10-CM

## 2013-08-06 DIAGNOSIS — E1165 Type 2 diabetes mellitus with hyperglycemia: Principal | ICD-10-CM

## 2013-08-06 DIAGNOSIS — IMO0001 Reserved for inherently not codable concepts without codable children: Secondary | ICD-10-CM

## 2013-08-06 LAB — POCT GLYCOSYLATED HEMOGLOBIN (HGB A1C): Hemoglobin A1C: 7.6

## 2013-08-06 MED ORDER — AZELASTINE HCL 0.1 % NA SOLN: NASAL | Status: DC

## 2013-08-06 MED ORDER — FLUTICASONE PROPIONATE 50 MCG/ACT NA SUSP: NASAL | Status: DC

## 2013-08-06 MED ORDER — METFORMIN HCL 500 MG PO TABS: ORAL_TABLET | ORAL | Status: DC

## 2013-08-06 MED ORDER — LISINOPRIL-HYDROCHLOROTHIAZIDE 20-12.5 MG PO TABS: ORAL_TABLET | ORAL | Status: DC

## 2013-08-06 NOTE — Patient Instructions (Addendum)
Lipid disorder- because you cannot tolerate statins for lowering cholesterol, I recommend you try Fish Oil supplement. Read the label and look for DHA + EPA content of at least 1000 mg. (Not all fish oil supplements are create equal!)  I have also given you some information about nutrition modifications for overall improved health.   Complementary and Alternative Medical Therapies for Diabetes Complementary and alternative medicines are health care practices or products that are not always accepted as part of routine medicine. Complementary medicine is used along with routine medicine (medical therapy). Alternative medicine can sometimes be used instead of routine medicine. Some people use these methods to treat diabetes. While some of these therapies may be effective, others may not be. Some may even be harmful. Patients using these methods need to tell their caregiver. It is important to let your caregivers know what you are doing. Some of these therapies are discussed below. For more information, talk with your caregiver.   THERAPIES  Biofeedback Biofeedback helps a person become more aware of the body's response to pain. It also helps you learn to deal with the pain. This alternative therapy focuses on relaxation and stress-reduction techniques. Thinking of peaceful mental images (guided imagery) is one technique. Some people believe these images can ease their condition. MEDICATIONS Magnesium Experts have studied the relationship between magnesium and diabetes for many years. But it is not yet fully understood. Studies suggest that a low amount of magnesium may make blood glucose control worse in type 2 diabetes. Research also shows that a low amount may contribute to certain diabetes complications. One study showed that people who consume more magnesium had less risk of type 2 diabetes. Eating whole grains, nuts, and green leafy vegetables raises the magnesium level. Cinnamon There have been  a couple of studies that seem to indicate cinnamon decreases insulin resistance and increases insulin production. By doing so, it may lower blood glucose. Exact doses are unknown, but it may work best when used in combination with other diabetes medicines. Document Released: 10/28/2006 Document Revised: 03/25/2011 Document Reviewed: 11/10/2008 [facility] Patient Information 2015 [place], [place]. This information is not intended to replace advice given to you by your health care provider. Make sure you discuss any questions you have with your health care provider.

## 2013-08-06 NOTE — Progress Notes (Signed)
S:  This 53 y.o. AA female is here for DM follow-up.  Last A1c = 7.1% in Jan 2015. She has recently started a fitness plan with her teenage daughter in addition to modifying nutrition for whole family. Change in fitness habits not solidified yet. Pt compliant w/ medications but stopped statin due to severe musculoskeletal aches and pains. No FSBS to report.  Vision evaluation w/ Dr. Roderic Palau earlier this year.   Patient Active Problem List   Diagnosis Date Noted  . HTN (hypertension) 02/01/2011  . Hyperlipemia 02/01/2011  . Obesity (BMI 30-39.9) 02/01/2011  . DM type 2 (diabetes mellitus, type 2) 02/01/2011    Outpatient Encounter Prescriptions as of 08/06/2013  Medication Sig  . aspirin 81 MG tablet Take 81 mg by mouth daily.  Marland Kitchen azelastine (ASTELIN) 0.1 % nasal spray PLACE 2 SPRAYS IN EACH NOSTRIL TWICE DAILY AS DIRECTED  . fluticasone (FLONASE) 50 MCG/ACT nasal spray PLACE 2 SPRAYS INTO THE NOSE DAILY  . lisinopril-hydrochlorothiazide (PRINZIDE,ZESTORETIC) 20-12.5 MG per tablet TAKE 1 TABLET BY MOUTH DAILY  . metFORMIN (GLUCOPHAGE) 500 MG tablet TAKE 1 TABLET BY MOUTH TWICE DAILY  . niacin (NIASPAN) 750 MG CR tablet TAKE 2 TABLETS BY MOUTH EVERY NIGHT AT BEDTIME    Allergies  Allergen Reactions  . Simvastatin     myalgia  . Statins     Severe musculoskeletal pains.    PMHx, Surg Hx, Soc and Fam Hx reviewed.   ROS: As per HPI; negative for abnormal weight change, fatigue, diaphoresis, vision disturbances, CP or tightness, palpitations, edema, SOB or DOE, cough, GI problems, HA, dizziness, numbness, weakness or syncope. No psych problems.   O: Filed Vitals:   08/06/13 1549  BP: 120/80  Pulse: 99  Temp: 99.3 F (37.4 C)  Resp: 18   GEN: In NAD; WN,WD.  Weight is stable. HENT: [place]/AT; EOMI w/ clear conj/sclerae. Ext ears/nose/oroph unremarkable. COR: RRR. No edema. LUNGS: Normal resp rate and effort. SKIN: W&D; intact w/o diaphoresis, erythema or jaundice. MS: MAEs;  no deformities or weakness. Gait normal. NEURO: A&O x 3; CNs intact. Nonfocal.   Results for orders placed in visit on 08/06/13  POCT GLYCOSYLATED HEMOGLOBIN (HGB A1C)      Result Value Ref Range   Hemoglobin A1C 7.6      A/P: Type II or unspecified type diabetes mellitus without mention of complication, uncontrolled - Increased A1c; pt prefers to remain on current medication with intent to commit to regular fitness program at least 3 days/week. She will be consistent about mindful nutrition and healthier lifestyle.  Continue same medications. Plan: POCT glycosylated hemoglobin (Hb A1C)  Essential hypertension- Stable and well controlled; no medication change at this time.   Meds ordered this encounter  Medications  . lisinopril-hydrochlorothiazide (PRINZIDE,ZESTORETIC) 20-12.5 MG per tablet    Sig: TAKE 1 TABLET BY MOUTH DAILY    Dispense:  90 tablet    Refill:  3  . metFORMIN (GLUCOPHAGE) 500 MG tablet    Sig: TAKE 1 TABLET BY MOUTH TWICE DAILY    Dispense:  60 tablet    Refill:  5  . azelastine (ASTELIN) 0.1 % nasal spray    Sig: PLACE 2 SPRAYS IN EACH NOSTRIL TWICE DAILY AS DIRECTED    Dispense:  90 mL    Refill:  5  . fluticasone (FLONASE) 50 MCG/ACT nasal spray    Sig: PLACE 2 SPRAYS INTO THE NOSE DAILY    Dispense:  48 g    Refill:  5  Fasting labs at next visit.

## 2013-08-21 ENCOUNTER — Other Ambulatory Visit: Payer: Self-pay | Admitting: Physician Assistant

## 2013-11-30 ENCOUNTER — Ambulatory Visit: Payer: BC Managed Care – PPO | Admitting: Family Medicine

## 2013-12-08 ENCOUNTER — Encounter: Payer: Self-pay | Admitting: Family Medicine

## 2013-12-08 ENCOUNTER — Ambulatory Visit (INDEPENDENT_AMBULATORY_CARE_PROVIDER_SITE_OTHER): Payer: BC Managed Care – PPO | Admitting: Family Medicine

## 2013-12-08 VITALS — BP 118/77 | HR 78 | Temp 98.2°F | Resp 16 | Ht 63.5 in | Wt 186.6 lb

## 2013-12-08 DIAGNOSIS — R0789 Other chest pain: Secondary | ICD-10-CM

## 2013-12-08 DIAGNOSIS — E785 Hyperlipidemia, unspecified: Secondary | ICD-10-CM

## 2013-12-08 DIAGNOSIS — I10 Essential (primary) hypertension: Secondary | ICD-10-CM

## 2013-12-08 DIAGNOSIS — E1165 Type 2 diabetes mellitus with hyperglycemia: Secondary | ICD-10-CM

## 2013-12-08 DIAGNOSIS — E669 Obesity, unspecified: Secondary | ICD-10-CM

## 2013-12-08 LAB — COMPREHENSIVE METABOLIC PANEL
ALT: 11 U/L (ref 0–35)
AST: 18 U/L (ref 0–37)
Albumin: 4.4 g/dL (ref 3.5–5.2)
Alkaline Phosphatase: 67 U/L (ref 39–117)
BILIRUBIN TOTAL: 0.8 mg/dL (ref 0.2–1.2)
BUN: 8 mg/dL (ref 6–23)
CO2: 27 meq/L (ref 19–32)
CREATININE: 0.73 mg/dL (ref 0.50–1.10)
Calcium: 9.1 mg/dL (ref 8.4–10.5)
Chloride: 99 mEq/L (ref 96–112)
GLUCOSE: 99 mg/dL (ref 70–99)
Potassium: 3.7 mEq/L (ref 3.5–5.3)
Sodium: 137 mEq/L (ref 135–145)
TOTAL PROTEIN: 7.4 g/dL (ref 6.0–8.3)

## 2013-12-08 LAB — CBC
HEMATOCRIT: 35.1 % — AB (ref 36.0–46.0)
HEMOGLOBIN: 12.3 g/dL (ref 12.0–15.0)
MCH: 27.9 pg (ref 26.0–34.0)
MCHC: 35 g/dL (ref 30.0–36.0)
MCV: 79.6 fL (ref 78.0–100.0)
MPV: 8.4 fL — ABNORMAL LOW (ref 9.4–12.4)
Platelets: 488 10*3/uL — ABNORMAL HIGH (ref 150–400)
RBC: 4.41 MIL/uL (ref 3.87–5.11)
RDW: 14.7 % (ref 11.5–15.5)
WBC: 5.8 10*3/uL (ref 4.0–10.5)

## 2013-12-08 LAB — LIPID PANEL
CHOL/HDL RATIO: 5.3 ratio
CHOLESTEROL: 261 mg/dL — AB (ref 0–200)
HDL: 49 mg/dL (ref >39)
LDL Cholesterol: 192 mg/dL — ABNORMAL HIGH (ref 0–99)
TRIGLYCERIDES: 101 mg/dL (ref <150)
VLDL: 20 mg/dL (ref 0–40)

## 2013-12-08 LAB — POCT GLYCOSYLATED HEMOGLOBIN (HGB A1C): Hemoglobin A1C: 7.3

## 2013-12-08 NOTE — Patient Instructions (Signed)
Increase metformin to 1000 mg twice a day Keep up the good work with diet and exercise! Follow up in 3 months for recheck of HgbA1c  If your chest pain get worse, is accompanied by nausea, vomiting, sweating, shortness of breath or occurs with exertion.      Why follow it? Research shows. . Those who follow the Mediterranean diet have a reduced risk of heart disease  . The diet is associated with a reduced incidence of Parkinson's and Alzheimer's diseases . People following the diet may have longer life expectancies and lower rates of chronic diseases  . The Dietary Guidelines for Americans recommends the Mediterranean diet as an eating plan to promote health and prevent disease  What Is the Mediterranean Diet?  . Healthy eating plan based on typical foods and recipes of Mediterranean-style cooking . The diet is primarily a plant based diet; these foods should make up a majority of meals   Starches - Plant based foods should make up a majority of meals - They are an important sources of vitamins, minerals, energy, antioxidants, and fiber - Choose whole grains, foods high in fiber and minimally processed items  - Typical grain sources include wheat, oats, barley, corn, brown rice, bulgar, farro, millet, polenta, couscous  - Various types of beans include chickpeas, lentils, fava beans, black beans, white beans   Fruits  Veggies - Large quantities of antioxidant rich fruits & veggies; 6 or more servings  - Vegetables can be eaten raw or lightly drizzled with oil and cooked  - Vegetables common to the traditional Mediterranean Diet include: artichokes, arugula, beets, broccoli, brussel sprouts, cabbage, carrots, celery, collard greens, cucumbers, eggplant, kale, leeks, lemons, lettuce, mushrooms, okra, onions, peas, peppers, potatoes, pumpkin, radishes, rutabaga, shallots, spinach, sweet potatoes, turnips, zucchini - Fruits common to the Mediterranean Diet include: apples, apricots, avocados,  cherries, clementines, dates, figs, grapefruits, grapes, melons, nectarines, oranges, peaches, pears, pomegranates, strawberries, tangerines  Fats - Replace butter and margarine with healthy oils, such as olive oil, canola oil, and tahini  - Limit nuts to no more than a handful a day  - Nuts include walnuts, almonds, pecans, pistachios, pine nuts  - Limit or avoid candied, honey roasted or heavily salted nuts - Olives are central to the [employer] - can be eaten whole or used in a variety of dishes   Meats Protein - Limiting red meat: no more than a few times a month - When eating red meat: choose lean cuts and keep the portion to the size of deck of cards - Eggs: approx. 0 to 4 times a week  - Fish and lean poultry: at least 2 a week  - Healthy protein sources include, chicken, [place], lean beef, lamb - Increase intake of seafood such as tuna, salmon, trout, mackerel, shrimp, scallops - Avoid or limit high fat processed meats such as sausage and bacon  Dairy - Include moderate amounts of low fat dairy products  - Focus on healthy dairy such as fat free yogurt, skim milk, low or reduced fat cheese - Limit dairy products higher in fat such as whole or 2% milk, cheese, ice cream  Alcohol - Moderate amounts of red wine is ok  - No more than 5 oz daily for women (all ages) and men older than age 67  - No more than 10 oz of wine daily for men younger than 13  Other - Limit sweets and other desserts  - Use herbs and spices instead of salt  to flavor foods  - Herbs and spices common to the traditional Mediterranean Diet include: basil, bay leaves, chives, cloves, cumin, fennel, garlic, lavender, marjoram, mint, oregano, parsley, pepper, rosemary, sage, savory, sumac, tarragon, thyme   It's not just a diet, it's a lifestyle:  . The Mediterranean diet includes lifestyle factors typical of those in the region  . Foods, drinks and meals are best eaten with others and savored . Daily physical  activity is important for overall good health . This could be strenuous exercise like running and aerobics . This could also be more leisurely activities such as walking, housework, yard-work, or taking the stairs . Moderation is the key; a balanced and healthy diet accommodates most foods and drinks . Consider portion sizes and frequency of consumption of certain foods   Meal Ideas & Options:  . Breakfast:  o Whole wheat toast or whole wheat English muffins with peanut butter & hard boiled egg o Steel cut oats topped with apples & cinnamon and skim milk  o Fresh fruit: banana, strawberries, melon, berries, peaches  o Smoothies: strawberries, bananas, greek yogurt, peanut butter o Low fat greek yogurt with blueberries and granola  o Egg white omelet with spinach and mushrooms o Breakfast couscous: whole wheat couscous, apricots, skim milk, cranberries  . Sandwiches:  o Hummus and grilled vegetables (peppers, zucchini, squash) on whole wheat bread   o Grilled chicken on whole wheat pita with lettuce, tomatoes, cucumbers or tzatziki  o Tuna salad on whole wheat bread: tuna salad made with greek yogurt, olives, red peppers, capers, green onions o Garlic rosemary lamb pita: lamb sauted with garlic, rosemary, salt & pepper; add lettuce, cucumber, greek yogurt to pita - flavor with lemon juice and black pepper  . Seafood:  o Mediterranean grilled salmon, seasoned with garlic, basil, parsley, lemon juice and black pepper o Shrimp, lemon, and spinach whole-grain pasta salad made with low fat greek yogurt  o Seared scallops with lemon orzo  o Seared tuna steaks seasoned salt, pepper, coriander topped with tomato mixture of olives, tomatoes, olive oil, minced garlic, parsley, green onions and cappers  . Meats:  o Herbed greek chicken salad with kalamata olives, cucumber, feta  o Red bell peppers stuffed with spinach, bulgur, lean ground beef (or lentils) & topped with feta   o Kebabs: skewers of  chicken, tomatoes, onions, zucchini, squash  o [place] burgers: made with red onions, mint, dill, lemon juice, feta cheese topped with roasted red peppers . Vegetarian o Cucumber salad: cucumbers, artichoke hearts, celery, red onion, feta cheese, tossed in olive oil & lemon juice  o Hummus and whole grain pita points with a greek salad (lettuce, tomato, feta, olives, cucumbers, red onion) o Lentil soup with celery, carrots made with vegetable broth, garlic, salt and pepper  o Tabouli salad: parsley, bulgur, mint, scallions, cucumbers, tomato, radishes, lemon juice, olive oil, salt and pepper.

## 2013-12-08 NOTE — Progress Notes (Signed)
Subjective:    Patient ID: Stephanie Nunez, female    DOB: 04/26/1960, 53 y.o.   MRN: [id number]  HPI Patient presents today for follow up of DM type 2. She has lost 6 pounds and has been watching her food intake and exercising more. She does not check her blood sugar at home. She had a glucometer and checked her blood sugars when she had gestational diabetes, but no longer has a glucometer. She does not think she would check it at home if she had a machine.   For the last 7-10 days the patient has had intermittent tightness in her upper left chest. It sometimes radiates to her back and down her left arm. She has some numbness down her left arm. She is never diaphoretic nor nauseous. No jaw pain. She gets relief with counter pressure to area. It always occurs when she is sitting still, never with aerobic exercise, never when lying down. She has gotten relief with ibuprofen 400 mg. She carries a heavy shoulder bag for work. She has noticed decreased episodes since switching bag to right shoulder. No family history of CAD- mother died from multiple myeloma, father from pancreatic cancer. Grandmothers both died from strokes- not sure of their ages. Siblings without heart disease.  Review of Systems  Constitutional: Negative for fever and fatigue.  Respiratory: Positive for chest tightness. Negative for apnea, cough and shortness of breath.   Cardiovascular: Negative for palpitations and leg swelling.  Gastrointestinal: Negative for nausea, vomiting and abdominal pain.  Neurological: Positive for numbness. Negative for dizziness and headaches.       Objective:   Physical Exam  Constitutional: She appears well-developed and well-nourished.  HENT:  Head: Atraumatic.  Nose: Nose normal.  Mouth/Throat: Oropharynx is clear and moist.  Eyes: Conjunctivae are normal.  Neck: Normal range of motion. Neck supple.  Cardiovascular: Normal rate, regular rhythm and normal heart sounds.   Pulmonary/Chest:  Effort normal and breath sounds normal.  Musculoskeletal: Normal range of motion. She exhibits tenderness. She exhibits no edema.       Cervical back: She exhibits tenderness (some tenderness over cervical neck and left trapezius.).  Lymphadenopathy:    She has no cervical adenopathy.  Vitals reviewed. BP 118/77 mmHg  Pulse 78  Temp(Src) 98.2 F (36.8 C) (Oral)  Resp 16  Ht 5' 3.5" (1.613 m)  Wt 186 lb 9.6 oz (84.641 kg)  BMI 32.53 kg/m2  SpO2 97%  LMP 12/04/2013 Wt Readings from Last 3 Encounters:  12/08/13 186 lb 9.6 oz (84.641 kg)  08/06/13 192 lb (87.091 kg)  02/11/13 192 lb (87.091 kg)   HgbA1c- 7/24- 7.6     11/24- 7.3  EKG- reviewed by Dr. Laney Pastor- normal sinus rhythm    Assessment & Plan:  1. Other chest pain - This does not seem cardiac in nature, patient will try a couple of days of OTC nsaids, discussed need to RTC/ go to ER if she has worsening pain, SOB, diaphoresis/nausea - EKG 12-Lead - CBC - Comprehensive metabolic panel - Lipid panel  2. Essential hypertension - well controlled - CBC - Comprehensive metabolic panel - Lipid panel  3. Obesity (BMI 30-39.9) - Encouraged continued weight loss with healthy food choices and increased exercise - CBC - Comprehensive metabolic panel - Lipid panel - POCT glycosylated hemoglobin (Hb A1C)  4. Hyperlipemia - CBC - Comprehensive metabolic panel - Lipid panel  5. Type 2 diabetes mellitus with hyperglycemia - Hgb A1c slightly improved, but will increase  metformin to 1000 mg bid - CBC - Comprehensive metabolic panel - Lipid panel - POCT glycosylated hemoglobin (Hb A1C) - follow up in 3 months  Elby Beck, FNP-BC  [facility], [facility]  12/10/2013 7:04 AM

## 2013-12-10 ENCOUNTER — Other Ambulatory Visit: Payer: Self-pay | Admitting: Family Medicine

## 2013-12-10 DIAGNOSIS — E669 Obesity, unspecified: Secondary | ICD-10-CM

## 2013-12-10 MED ORDER — METFORMIN HCL 1000 MG PO TABS: 1000.0000 mg | ORAL_TABLET | Freq: Two times a day (BID) | ORAL | Status: DC

## 2013-12-10 NOTE — Addendum Note (Signed)
Addended by: Clarene Reamer B on: 12/10/2013 02:37 PM   Modules accepted: Orders

## 2014-01-04 LAB — HM MAMMOGRAPHY: HM Mammogram: NEGATIVE

## 2014-01-05 ENCOUNTER — Encounter: Payer: Self-pay | Admitting: Physician Assistant

## 2014-02-06 ENCOUNTER — Other Ambulatory Visit: Payer: Self-pay | Admitting: Family Medicine

## 2014-03-15 ENCOUNTER — Ambulatory Visit: Payer: BC Managed Care – PPO | Admitting: Family Medicine

## 2014-04-12 ENCOUNTER — Ambulatory Visit (INDEPENDENT_AMBULATORY_CARE_PROVIDER_SITE_OTHER): Payer: BC Managed Care – PPO | Admitting: Family Medicine

## 2014-04-12 ENCOUNTER — Encounter: Payer: Self-pay | Admitting: Family Medicine

## 2014-04-12 VITALS — BP 123/84 | HR 90 | Temp 98.0°F | Resp 16 | Ht 63.0 in | Wt 185.2 lb

## 2014-04-12 DIAGNOSIS — E119 Type 2 diabetes mellitus without complications: Secondary | ICD-10-CM | POA: Diagnosis not present

## 2014-04-12 DIAGNOSIS — E785 Hyperlipidemia, unspecified: Secondary | ICD-10-CM

## 2014-04-12 DIAGNOSIS — E669 Obesity, unspecified: Secondary | ICD-10-CM

## 2014-04-12 DIAGNOSIS — Z1211 Encounter for screening for malignant neoplasm of colon: Secondary | ICD-10-CM | POA: Diagnosis not present

## 2014-04-12 DIAGNOSIS — I10 Essential (primary) hypertension: Secondary | ICD-10-CM | POA: Diagnosis not present

## 2014-04-12 LAB — HEMOGLOBIN A1C
HEMOGLOBIN A1C: 7.1 % — AB (ref <5.7)
Mean Plasma Glucose: 157 mg/dL — ABNORMAL HIGH (ref <117)

## 2014-04-12 NOTE — Patient Instructions (Signed)
Keep up the good work with your diet and add more exercise

## 2014-04-12 NOTE — Progress Notes (Signed)
   Subjective:    Patient ID: Stephanie Nunez, female    DOB: [birth year], 54 y.o.   MRN: [id number]  HPI Patient presents today for follow up of diabetes/HTN/hyperlipidemia/obesity. She has been eliminating meat from her diet and is feeling better with less bloating and more energy. She is down 2 pounds. She has not been exercising as much as she normally does due to a change in her work duties. She anticipates walking more and doing more outside work with the warmer weather.   Her husband works out of town and the patient spend a great deal of time with her 61 yo daughter and her activities. She will be sad to see her daughter go to college in the fall [facility]), but is looking forward to working out more frequently and reading more.   Her left shoulder pain is considerably improved. She will occasionally have a brief pain at her upper back or left upper chest that is relieved with counter pressure with in a few minutes.   Has never had screening colonoscopy and is interested.   Review of Systems No SOB, no PND, no nausea/vomiting/abdominal pain/diarrhea, no pedal edema    Objective:   Physical Exam  Constitutional: She is oriented to person, place, and time. She appears well-developed and well-nourished.  Obese   HENT:  Head: Normocephalic and atraumatic.  Eyes: Conjunctivae are normal.  Neck: Normal range of motion. Neck supple.  Cardiovascular: Normal rate, regular rhythm and normal heart sounds.   Pulmonary/Chest: Effort normal and breath sounds normal.  Musculoskeletal: Normal range of motion.  Neurological: She is alert and oriented to person, place, and time.  Skin: Skin is warm and dry.  Psychiatric: She has a normal mood and affect. Her behavior is normal. Judgment and thought content normal.  Vitals reviewed.  BP 123/84 mmHg  Pulse 90  Temp(Src) 98 F (36.7 C) (Oral)  Resp 16  Ht 5\' 3"  (1.6 m)  Wt 185 lb 3.2 oz (84.006 kg)  BMI 32.81 kg/m2  SpO2 98%  LMP 03/15/2014     Assessment & Plan:  1. Type 2 diabetes mellitus without complication - HM Diabetes Foot Exam - Hemoglobin A1c - discussed importance of continued, gradual weight loss and regular exercise to decrease glucose  2. Essential hypertension - bp stable on prinzide  3. Obesity (BMI 30-39.9) - see #1  4. Hyperlipemia - continue niacin and recheck with next visit  5. Screening for colon cancer - Ambulatory referral to Gastroenterology  - follow up in 6 months for [place], FNP-BC  [facility], [place] [facility]  04/12/2014 9:24 PM

## 2014-05-16 ENCOUNTER — Other Ambulatory Visit: Payer: Self-pay | Admitting: Family Medicine

## 2014-06-18 ENCOUNTER — Other Ambulatory Visit: Payer: Self-pay | Admitting: Physician Assistant

## 2014-08-02 ENCOUNTER — Other Ambulatory Visit: Payer: Self-pay | Admitting: Physician Assistant

## 2014-08-09 ENCOUNTER — Other Ambulatory Visit: Payer: Self-pay | Admitting: Family Medicine

## 2014-09-01 ENCOUNTER — Other Ambulatory Visit: Payer: Self-pay | Admitting: Physician Assistant

## 2014-09-01 ENCOUNTER — Other Ambulatory Visit: Payer: Self-pay | Admitting: Family Medicine

## 2014-10-11 ENCOUNTER — Encounter: Payer: Self-pay | Admitting: Family Medicine

## 2014-10-11 ENCOUNTER — Ambulatory Visit (INDEPENDENT_AMBULATORY_CARE_PROVIDER_SITE_OTHER): Payer: BC Managed Care – PPO | Admitting: Family Medicine

## 2014-10-11 VITALS — BP 118/80 | HR 97 | Temp 98.7°F | Resp 16 | Ht 62.75 in | Wt 185.6 lb

## 2014-10-11 DIAGNOSIS — E785 Hyperlipidemia, unspecified: Secondary | ICD-10-CM | POA: Diagnosis not present

## 2014-10-11 DIAGNOSIS — Z124 Encounter for screening for malignant neoplasm of cervix: Secondary | ICD-10-CM

## 2014-10-11 DIAGNOSIS — J302 Other seasonal allergic rhinitis: Secondary | ICD-10-CM

## 2014-10-11 DIAGNOSIS — E119 Type 2 diabetes mellitus without complications: Secondary | ICD-10-CM | POA: Diagnosis not present

## 2014-10-11 DIAGNOSIS — Z0000 Encounter for general adult medical examination without abnormal findings: Secondary | ICD-10-CM | POA: Diagnosis not present

## 2014-10-11 DIAGNOSIS — E669 Obesity, unspecified: Secondary | ICD-10-CM | POA: Diagnosis not present

## 2014-10-11 DIAGNOSIS — I10 Essential (primary) hypertension: Secondary | ICD-10-CM

## 2014-10-11 LAB — CBC
HEMATOCRIT: 33.6 % — AB (ref 36.0–46.0)
HEMOGLOBIN: 11.1 g/dL — AB (ref 12.0–15.0)
MCH: 26.6 pg (ref 26.0–34.0)
MCHC: 33 g/dL (ref 30.0–36.0)
MCV: 80.4 fL (ref 78.0–100.0)
MPV: 8.5 fL — ABNORMAL LOW (ref 8.6–12.4)
Platelets: 492 10*3/uL — ABNORMAL HIGH (ref 150–400)
RBC: 4.18 MIL/uL (ref 3.87–5.11)
RDW: 15.3 % (ref 11.5–15.5)
WBC: 3.8 10*3/uL — ABNORMAL LOW (ref 4.0–10.5)

## 2014-10-11 LAB — COMPREHENSIVE METABOLIC PANEL
ALT: 10 U/L (ref 6–29)
AST: 16 U/L (ref 10–35)
Albumin: 4.3 g/dL (ref 3.6–5.1)
Alkaline Phosphatase: 58 U/L (ref 33–130)
BUN: 6 mg/dL — ABNORMAL LOW (ref 7–25)
CALCIUM: 9.2 mg/dL (ref 8.6–10.4)
CHLORIDE: 99 mmol/L (ref 98–110)
CO2: 29 mmol/L (ref 20–31)
Creat: 0.58 mg/dL (ref 0.50–1.05)
Glucose, Bld: 105 mg/dL — ABNORMAL HIGH (ref 65–99)
Potassium: 4.1 mmol/L (ref 3.5–5.3)
SODIUM: 141 mmol/L (ref 135–146)
Total Bilirubin: 0.9 mg/dL (ref 0.2–1.2)
Total Protein: 7 g/dL (ref 6.1–8.1)

## 2014-10-11 LAB — LIPID PANEL
Cholesterol: 219 mg/dL — ABNORMAL HIGH (ref 125–200)
HDL: 46 mg/dL (ref >=46)
LDL CALC: 157 mg/dL — AB (ref <130)
Total CHOL/HDL Ratio: 4.8 Ratio (ref <=5.0)
Triglycerides: 82 mg/dL (ref <150)
VLDL: 16 mg/dL (ref <30)

## 2014-10-11 LAB — POCT GLYCOSYLATED HEMOGLOBIN (HGB A1C): Hemoglobin A1C: 6.6

## 2014-10-11 LAB — TSH: TSH: 1.079 u[IU]/mL (ref 0.350–4.500)

## 2014-10-11 MED ORDER — LISINOPRIL-HYDROCHLOROTHIAZIDE 20-12.5 MG PO TABS: 1.0000 | ORAL_TABLET | Freq: Every day | ORAL | Status: DC

## 2014-10-11 MED ORDER — FLUTICASONE PROPIONATE 50 MCG/ACT NA SUSP: NASAL | Status: DC

## 2014-10-11 MED ORDER — NIACIN ER (ANTIHYPERLIPIDEMIC) 750 MG PO TBCR: 1500.0000 mg | EXTENDED_RELEASE_TABLET | Freq: Every day | ORAL | Status: DC

## 2014-10-11 MED ORDER — METFORMIN HCL 1000 MG PO TABS: 1000.0000 mg | ORAL_TABLET | Freq: Two times a day (BID) | ORAL | Status: DC

## 2014-10-11 MED ORDER — AZELASTINE HCL 0.1 % NA SOLN: NASAL | Status: DC

## 2014-10-11 NOTE — Progress Notes (Signed)
Subjective:    Patient ID: Stephanie Nunez, female    DOB: 01/11/1961, 54 y.o.   MRN: [id number]  HPI This is a pleasant 54 yo female who presents today for CPE. She has been doing well, managing stress. Her youngest went away to college [employer] [facility]). 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 She actively works to keep her stress level low.   Last CPE- 2014 Mammo- 12/15 Pap-2010, she continues to have regular menses Colonoscopy- has not had, is considering Tdap- 10/16 Flu- 9/16 Eye- regular Dental- regular Exercise- walking regularly  Past Medical History  Diagnosis Date  . Gestational diabetes mellitus   . Uterine fibroid   . Diabetes type 2, controlled   . Hyperlipidemia   . Hypertension   . Allergy    Past Surgical History  Procedure Laterality Date  . Lumbar spine surgery      L4-5  . Lipoma excision      Left chest wall   Family History  Problem Relation Age of Onset  . Stroke Maternal Grandmother   . Stroke Paternal Grandmother   . Cancer Father     pancreatic  . Diabetes Sister   . Hypertension Sister   . Hypertension Sister   . Hypertension Brother    Social History  Substance Use Topics  . Smoking status: Never Smoker   . Smokeless tobacco: Never Used  . Alcohol Use: 2.4 oz/week    4 Glasses of wine per week    Review of Systems  Constitutional: Negative.   HENT: Negative.   Eyes: Negative.   Respiratory: Negative.   Cardiovascular: Negative.   Gastrointestinal: Negative.   Endocrine: Negative.   Genitourinary: Negative.   Musculoskeletal: Negative.   Skin: Negative.   Allergic/Immunologic: Negative.   Neurological: Negative.   Hematological: Negative.   Psychiatric/Behavioral: Negative.       Objective:   Physical Exam Physical Exam  Constitutional: She is oriented to person, place, and time. She appears well-developed and well-nourished. No distress.  HENT:  Head: Normocephalic and atraumatic.  Right Ear: External ear normal.   Left Ear: External ear normal.  Nose: Nose normal.  Mouth/Throat: Oropharynx is clear and moist. No oropharyngeal exudate.  Eyes: Conjunctivae are normal. Pupils are equal, round, and reactive to light.  Neck: Normal range of motion. Neck supple. No JVD present. No thyromegaly present.  Cardiovascular: Normal rate, regular rhythm, normal heart sounds and intact distal pulses.   Pulmonary/Chest: Effort normal and breath sounds normal. Right breast exhibits no inverted nipple, no mass, no nipple discharge, no skin change and no tenderness. Left breast exhibits no inverted nipple, no mass, no nipple discharge, no skin change and no tenderness. Breasts are symmetrical.  Abdominal: Soft. Bowel sounds are normal. She exhibits no distension and no mass. There is no tenderness. There is no rebound and no guarding.  Genitourinary: Vagina normal. Pelvic exam was performed with patient supine. There is no rash, tenderness, lesion or injury on the right labia. There is no rash, tenderness, lesion or injury on the left labia. Cervix exhibits no motion tenderness and no discharge. No vaginal discharge found.  Musculoskeletal: Normal range of motion. She exhibits no edema or tenderness.  Lymphadenopathy:    She has no cervical adenopathy.  Neurological: She is alert and oriented to person, place, and time. She has normal reflexes.  Skin: Skin is warm and dry. She is not diaphoretic.  Psychiatric: She has a normal mood and affect. Her behavior is normal. Judgment  and thought content normal.  Vitals reviewed.  BP 151/104 mmHg  Pulse 97  Temp(Src) 98.7 F (37.1 C) (Oral)  Resp 16  Ht 5' 2.75" (1.594 m)  Wt 185 lb 9.6 oz (84.188 kg)  BMI 33.13 kg/m2  LMP 09/29/2014 Wt Readings from Last 3 Encounters:  10/11/14 185 lb 9.6 oz (84.188 kg)  04/12/14 185 lb 3.2 oz (84.006 kg)  12/08/13 186 lb 9.6 oz (84.641 kg)   BP recheck- 118/80 Results for orders placed or performed in visit on 10/11/14  POCT  glycosylated hemoglobin (Hb A1C)  Result Value Ref Range   Hemoglobin A1C 6.6        Assessment & Plan:  1. Annual physical exam - discussed health maintenance- patient considering colonoscopy and we will revisit at next appointment  2. Type 2 diabetes mellitus without complication - Improved [id number] 7.1 to 6.6- discussed great progress with patient and encouraged continued dietary modifications and exercise - CBC - Comprehensive metabolic panel - Lipid panel - TSH - POCT glycosylated hemoglobin (Hb A1C) - metFORMIN (GLUCOPHAGE) 1000 MG tablet; Take 1 tablet (1,000 mg total) by mouth 2 (two) times daily with a meal.  Dispense: 180 tablet; Refill: 1  3. Essential hypertension - CBC - Comprehensive metabolic panel - Lipid panel - TSH - POCT glycosylated hemoglobin (Hb A1C) - lisinopril-hydrochlorothiazide (PRINZIDE,ZESTORETIC) 20-12.5 MG per tablet; Take 1 tablet by mouth daily.  Dispense: 90 tablet; Refill: 1  4. Obesity (BMI 30-39.9) - CBC - Comprehensive metabolic panel - Lipid panel - TSH - POCT glycosylated hemoglobin (Hb A1C)  5. Hyperlipemia - CBC - Comprehensive metabolic panel - Lipid panel - TSH - POCT glycosylated hemoglobin (Hb A1C) - niacin (NIASPAN) 750 MG CR tablet; Take 2 tablets (1,500 mg total) by mouth at bedtime.  Dispense: 180 tablet; Refill: 1  6. Screening for cervical cancer - Pap IG, CT/NG w/ reflex HPV when ASC-U  7. Other seasonal allergic rhinitis - azelastine (ASTELIN) 0.1 % nasal spray; PLACE 2 SPRAYS IN EACH NOSTRIL TWICE DAILY AS DIRECTED  Dispense: 90 mL; Refill: 5  - follow up in 6 months Clarene Reamer, FNP-BC  [facility] [place] [facility]  10/11/2014 9:19 AM

## 2014-10-11 NOTE — Patient Instructions (Addendum)
For your allergies try [place] nasal spray (for dry nasal passages) and OTC antihistamine eye drops (for itching and watery eyes).      Why follow it? Research shows. . Those who follow the Mediterranean diet have a reduced risk of heart disease  . The diet is associated with a reduced incidence of Parkinson's and Alzheimer's diseases . People following the diet may have longer life expectancies and lower rates of chronic diseases  . The Dietary Guidelines for Americans recommends the Mediterranean diet as an eating plan to promote health and prevent disease  What Is the Mediterranean Diet?  . Healthy eating plan based on typical foods and recipes of Mediterranean-style cooking . The diet is primarily a plant based diet; these foods should make up a majority of meals   Starches - Plant based foods should make up a majority of meals - They are an important sources of vitamins, minerals, energy, antioxidants, and fiber - Choose whole grains, foods high in fiber and minimally processed items  - Typical grain sources include wheat, oats, barley, corn, brown rice, bulgar, farro, millet, polenta, couscous  - Various types of beans include chickpeas, lentils, fava beans, black beans, white beans   Fruits  Veggies - Large quantities of antioxidant rich fruits & veggies; 6 or more servings  - Vegetables can be eaten raw or lightly drizzled with oil and cooked  - Vegetables common to the traditional Mediterranean Diet include: artichokes, arugula, beets, broccoli, brussel sprouts, cabbage, carrots, celery, collard greens, cucumbers, eggplant, kale, leeks, lemons, lettuce, mushrooms, okra, onions, peas, peppers, potatoes, pumpkin, radishes, rutabaga, shallots, spinach, sweet potatoes, turnips, zucchini - Fruits common to the Mediterranean Diet include: apples, apricots, avocados, cherries, clementines, dates, figs, grapefruits, grapes, melons, nectarines, oranges, peaches, pears, pomegranates, strawberries,  tangerines  Fats - Replace butter and margarine with healthy oils, such as olive oil, canola oil, and tahini  - Limit nuts to no more than a handful a day  - Nuts include walnuts, almonds, pecans, pistachios, pine nuts  - Limit or avoid candied, honey roasted or heavily salted nuts - Olives are central to the [employer] - can be eaten whole or used in a variety of dishes   Meats Protein - Limiting red meat: no more than a few times a month - When eating red meat: choose lean cuts and keep the portion to the size of deck of cards - Eggs: approx. 0 to 4 times a week  - Fish and lean poultry: at least 2 a week  - Healthy protein sources include, chicken, [place], lean beef, lamb - Increase intake of seafood such as tuna, salmon, trout, mackerel, shrimp, scallops - Avoid or limit high fat processed meats such as sausage and bacon  Dairy - Include moderate amounts of low fat dairy products  - Focus on healthy dairy such as fat free yogurt, skim milk, low or reduced fat cheese - Limit dairy products higher in fat such as whole or 2% milk, cheese, ice cream  Alcohol - Moderate amounts of red wine is ok  - No more than 5 oz daily for women (all ages) and men older than age 33  - No more than 10 oz of wine daily for men younger than 86  Other - Limit sweets and other desserts  - Use herbs and spices instead of salt to flavor foods  - Herbs and spices common to the traditional Mediterranean Diet include: basil, bay leaves, chives, cloves, cumin, fennel, garlic, lavender, marjoram,  mint, oregano, parsley, pepper, rosemary, sage, savory, sumac, tarragon, thyme   It's not just a diet, it's a lifestyle:  . The Mediterranean diet includes lifestyle factors typical of those in the region  . Foods, drinks and meals are best eaten with others and savored . Daily physical activity is important for overall good health . This could be strenuous exercise like running and aerobics . This could also be  more leisurely activities such as walking, housework, yard-work, or taking the stairs . Moderation is the key; a balanced and healthy diet accommodates most foods and drinks . Consider portion sizes and frequency of consumption of certain foods   Meal Ideas & Options:  . Breakfast:  o Whole wheat toast or whole wheat English muffins with peanut butter & hard boiled egg o Steel cut oats topped with apples & cinnamon and skim milk  o Fresh fruit: banana, strawberries, melon, berries, peaches  o Smoothies: strawberries, bananas, greek yogurt, peanut butter o Low fat greek yogurt with blueberries and granola  o Egg white omelet with spinach and mushrooms o Breakfast couscous: whole wheat couscous, apricots, skim milk, cranberries  . Sandwiches:  o Hummus and grilled vegetables (peppers, zucchini, squash) on whole wheat bread   o Grilled chicken on whole wheat pita with lettuce, tomatoes, cucumbers or tzatziki  o Tuna salad on whole wheat bread: tuna salad made with greek yogurt, olives, red peppers, capers, green onions o Garlic rosemary lamb pita: lamb sauted with garlic, rosemary, salt & pepper; add lettuce, cucumber, greek yogurt to pita - flavor with lemon juice and black pepper  . Seafood:  o Mediterranean grilled salmon, seasoned with garlic, basil, parsley, lemon juice and black pepper o Shrimp, lemon, and spinach whole-grain pasta salad made with low fat greek yogurt  o Seared scallops with lemon orzo  o Seared tuna steaks seasoned salt, pepper, coriander topped with tomato mixture of olives, tomatoes, olive oil, minced garlic, parsley, green onions and cappers  . Meats:  o Herbed greek chicken salad with kalamata olives, cucumber, feta  o Red bell peppers stuffed with spinach, bulgur, lean ground beef (or lentils) & topped with feta   o Kebabs: skewers of chicken, tomatoes, onions, zucchini, squash  o [place] burgers: made with red onions, mint, dill, lemon juice, feta cheese topped  with roasted red peppers . Vegetarian o Cucumber salad: cucumbers, artichoke hearts, celery, red onion, feta cheese, tossed in olive oil & lemon juice  o Hummus and whole grain pita points with a greek salad (lettuce, tomato, feta, olives, cucumbers, red onion) o Lentil soup with celery, carrots made with vegetable broth, garlic, salt and pepper  o Tabouli salad: parsley, bulgur, mint, scallions, cucumbers, tomato, radishes, lemon juice, olive oil, salt and pepper.

## 2014-10-12 LAB — PAP IG, CT-NG, RFX HPV ASCU
CHLAMYDIA PROBE AMP: NEGATIVE
GC PROBE AMP: NEGATIVE

## 2014-11-07 ENCOUNTER — Other Ambulatory Visit: Payer: Self-pay | Admitting: Physician Assistant

## 2014-11-20 ENCOUNTER — Ambulatory Visit (INDEPENDENT_AMBULATORY_CARE_PROVIDER_SITE_OTHER): Payer: BC Managed Care – PPO

## 2014-11-20 ENCOUNTER — Ambulatory Visit (INDEPENDENT_AMBULATORY_CARE_PROVIDER_SITE_OTHER): Payer: BC Managed Care – PPO | Admitting: Family Medicine

## 2014-11-20 VITALS — BP 138/90 | HR 80 | Temp 98.6°F | Resp 18 | Ht 63.0 in | Wt 186.0 lb

## 2014-11-20 DIAGNOSIS — R140 Abdominal distension (gaseous): Secondary | ICD-10-CM

## 2014-11-20 DIAGNOSIS — E119 Type 2 diabetes mellitus without complications: Secondary | ICD-10-CM | POA: Diagnosis not present

## 2014-11-20 DIAGNOSIS — R1011 Right upper quadrant pain: Secondary | ICD-10-CM

## 2014-11-20 DIAGNOSIS — E876 Hypokalemia: Secondary | ICD-10-CM

## 2014-11-20 DIAGNOSIS — K920 Hematemesis: Secondary | ICD-10-CM | POA: Diagnosis not present

## 2014-11-20 DIAGNOSIS — R110 Nausea: Secondary | ICD-10-CM | POA: Diagnosis not present

## 2014-11-20 DIAGNOSIS — R12 Heartburn: Secondary | ICD-10-CM

## 2014-11-20 DIAGNOSIS — E871 Hypo-osmolality and hyponatremia: Secondary | ICD-10-CM

## 2014-11-20 LAB — COMPLETE METABOLIC PANEL WITH GFR
ALBUMIN: 4 g/dL (ref 3.6–5.1)
ALK PHOS: 51 U/L (ref 33–130)
ALT: 10 U/L (ref 6–29)
AST: 16 U/L (ref 10–35)
BILIRUBIN TOTAL: 0.8 mg/dL (ref 0.2–1.2)
BUN: 6 mg/dL — AB (ref 7–25)
CO2: 29 mmol/L (ref 20–31)
CREATININE: 0.55 mg/dL (ref 0.50–1.05)
Calcium: 8.6 mg/dL (ref 8.6–10.4)
Chloride: 96 mmol/L — ABNORMAL LOW (ref 98–110)
GFR, Est African American: >89 mL/min (ref >=60)
GLUCOSE: 129 mg/dL — AB (ref 65–99)
Potassium: 3.2 mmol/L — ABNORMAL LOW (ref 3.5–5.3)
SODIUM: 130 mmol/L — AB (ref 135–146)
Total Protein: 6.7 g/dL (ref 6.1–8.1)

## 2014-11-20 LAB — POCT CBC
Granulocyte percent: 73.5 %G (ref 37–80)
HCT, POC: 32.6 % — AB (ref 37.7–47.9)
HEMOGLOBIN: 10.7 g/dL — AB (ref 12.2–16.2)
Lymph, poc: 1.7 (ref 0.6–3.4)
MCH: 26.2 pg — AB (ref 27–31.2)
MCHC: 32.9 g/dL (ref 31.8–35.4)
MCV: 79.6 fL — AB (ref 80–97)
MID (cbc): 0.2 (ref 0–0.9)
MPV: 6.5 fL (ref 0–99.8)
PLATELET COUNT, POC: 433 10*3/uL — AB (ref 142–424)
POC Granulocyte: 5.2 (ref 2–6.9)
POC LYMPH PERCENT: 23.4 %L (ref 10–50)
POC MID %: 3.1 %M (ref 0–12)
RBC: 4.1 M/uL (ref 4.04–5.48)
RDW, POC: 15.4 %
WBC: 7.1 10*3/uL (ref 4.6–10.2)

## 2014-11-20 LAB — LIPASE: Lipase: 34 U/L (ref 7–60)

## 2014-11-20 LAB — GLUCOSE, POCT (MANUAL RESULT ENTRY): POC GLUCOSE: 130 mg/dL — AB (ref 70–99)

## 2014-11-20 MED ORDER — OMEPRAZOLE 40 MG PO CPDR: 40.0000 mg | DELAYED_RELEASE_CAPSULE | Freq: Every day | ORAL | Status: DC

## 2014-11-20 MED ORDER — SUCRALFATE 1 G PO TABS: 1.0000 g | ORAL_TABLET | Freq: Three times a day (TID) | ORAL | Status: DC

## 2014-11-20 NOTE — Progress Notes (Signed)
Subjective:  This chart was scribed for Merri Ray, MD by Moises Blood, Medical Scribe. This patient was seen in room 12 and the patient's care was started 2:35 PM.   Patient ID: Stephanie Nunez, female    DOB: April 20, 1960, 54 y.o.   MRN: [id number]  HPI Stephanie Nunez is a 54 y.o. female H/o DM and HTN.  Pt complains of upper abd pain with distension that started 7:00PM yesterday. She had 2 pieces of toast with raspberry jam to eat last night. The pain started while she was eating. She had 4 episodes of violent vomiting. She drank some ginger ale and 5-10 ritz crackers this morning. She thought she was going to throw up. About 1:00AM, she threw up with a little blood in it. She denies any abd surgery. She denies colonoscopy done. She had indigestion heart burn for about a year now. She denies intestinal problems in the past. She denies fever, diarrhea, dysuria, hematuria, urinary symptoms, vaginal bleeding.   Attempted treatments with mild relief: total famotidine 60 mg, meclizine 25 mg, mylanta.   She takes ibuprofen occasionally, once a week. She had a sip of mimosa yesterday. She has glass of red wine 3-4 times a week.   Her last a1c was checked during her physical in Sept.  Lab Results  Component Value Date   HGBA1C 6.6 10/11/2014    Patient Active Problem List   Diagnosis Date Noted  . HTN (hypertension) 02/01/2011  . Hyperlipemia 02/01/2011  . Obesity (BMI 30-39.9) 02/01/2011  . DM type 2 (diabetes mellitus, type 2) ([place]) 02/01/2011   Past Medical History  Diagnosis Date  . Gestational diabetes mellitus   . Uterine fibroid   . Diabetes type 2, controlled ([place])   . Hyperlipidemia   . Hypertension   . Allergy    Past Surgical History  Procedure Laterality Date  . Lumbar spine surgery      L4-5  . Lipoma excision      Left chest wall   Allergies  Allergen Reactions  . Simvastatin     myalgia  . Statins     Severe musculoskeletal pains.   Prior to  Admission medications   Medication Sig Start Date End Date Taking? Authorizing Provider  aspirin 81 MG tablet Take 81 mg by mouth daily.   Yes Historical Provider, MD  azelastine (ASTELIN) 0.1 % nasal spray PLACE 2 SPRAYS IN EACH NOSTRIL TWICE DAILY AS DIRECTED 10/11/14  Yes Elby Beck, FNP  fluticasone [facility]) 50 MCG/ACT nasal spray PLACE 2 SPRAYS INTO THE NOSE DAILY 10/11/14  Yes Elby Beck, FNP  lisinopril-hydrochlorothiazide (PRINZIDE,ZESTORETIC) 20-12.5 MG per tablet Take 1 tablet by mouth daily. 10/11/14  Yes Elby Beck, FNP  metFORMIN (GLUCOPHAGE) 1000 MG tablet Take 1 tablet (1,000 mg total) by mouth 2 (two) times daily with a meal. 10/11/14  Yes Elby Beck, FNP  niacin (NIASPAN) 750 MG CR tablet Take 2 tablets (1,500 mg total) by mouth at bedtime. 10/11/14  Yes Elby Beck, FNP   Social History   Social History  . Marital Status: Married    Spouse Name: Gerald Stabs  . Number of Children: 1  . Years of Education: 16   Occupational History  . homemaker    Social History Main Topics  . Smoking status: Never Smoker   . Smokeless tobacco: Never Used  . Alcohol Use: 2.4 oz/week    4 Glasses of wine per week  . Drug Use: No  .  Sexual Activity:    Partners: Male    Birth Control/ Protection: Condom   Other Topics Concern  . Not on file   Social History Narrative   Lives with her husband and their daughter.  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    Review of Systems  Constitutional: Negative for fever.  Gastrointestinal: Positive for nausea, vomiting, abdominal pain and abdominal distention. Negative for diarrhea, constipation and blood in stool.  Genitourinary: Negative for dysuria, urgency, frequency, hematuria and vaginal bleeding.       Objective:   Physical Exam  Constitutional: She is oriented to person, place, and time. She appears well-developed and well-nourished. No distress.  HENT:  Head: Normocephalic and atraumatic.    Eyes: EOM are normal. Pupils are equal, round, and reactive to light.  Neck: Neck supple.  Cardiovascular: Normal rate, regular rhythm and normal heart sounds.  Exam reveals no gallop and no friction rub.   No murmur heard. Pulmonary/Chest: Effort normal and breath sounds normal. No respiratory distress. She has no wheezes.  Abdominal: She exhibits distension. There is tenderness in the right upper quadrant. There is negative Murphy's sign.  Most tender in RUQ more so than epigastric, slight distension, negative heel-jar  Musculoskeletal: Normal range of motion.  Neurological: She is alert and oriented to person, place, and time.  Skin: Skin is warm and dry.  Psychiatric: She has a normal mood and affect. Her behavior is normal.  Nursing note and vitals reviewed.   Filed Vitals:   11/20/14 1401  BP: 138/90  Pulse: 80  Temp: 98.6 F (37 C)  TempSrc: Oral  Resp: 18  Height: 5\' 3"  (1.6 m)  Weight: 186 lb (84.369 kg)  SpO2: 98%   Results for orders placed or performed in visit on 11/20/14  POCT CBC  Result Value Ref Range   WBC 7.1 4.6 - 10.2 K/uL   Lymph, poc 1.7 0.6 - 3.4   POC LYMPH PERCENT 23.4 10 - 50 %L   MID (cbc) 0.2 0 - 0.9   POC MID % 3.1 0 - 12 %M   POC Granulocyte 5.2 2 - 6.9   Granulocyte percent 73.5 37 - 80 %G   RBC 4.10 4.04 - 5.48 M/uL   Hemoglobin 10.7 (A) 12.2 - 16.2 g/dL   HCT, POC 32.6 (A) 37.7 - 47.9 %   MCV 79.6 (A) 80 - 97 fL   MCH, POC 26.2 (A) 27 - 31.2 pg   MCHC 32.9 31.8 - 35.4 g/dL   RDW, POC 15.4 %   Platelet Count, POC 433 (A) 142 - 424 K/uL   MPV 6.5 0 - 99.8 fL  POCT glucose (manual entry)  Result Value Ref Range   POC Glucose 130 (A) 70 - 99 mg/dl   [facility] reading (PRIMARY) by Dr. Carlota Raspberry : abdomen xray: no apparent air-fluid levels, non specific bowel gas findings     Assessment & Plan:   Stephanie Nunez is a 54 y.o. female RUQ abdominal pain - Plan: POCT CBC, COMPLETE METABOLIC PANEL WITH GFR, Lipase, US Abdomen Complete, CANCELED:  US Abdomen Complete  Hematemesis with nausea - Plan: COMPLETE METABOLIC PANEL WITH GFR, Lipase, US Abdomen Complete  Abdominal distension - Plan: DG Abd Acute W/Chest  Controlled type 2 diabetes mellitus without complication, without long-term current use of insulin (HCC) - Plan: POCT glucose (manual entry)  Heartburn - Plan: sucralfate (CARAFATE) 1 G tablet, omeprazole (PRILOSEC) 40 MG capsule  Based on history has had long-term  heartburn/GERD recurrent. Now with worsening of heartburn and epigastric pain along with right upper quadrant pain since last night, multiple episodes of emesis with one episode of hematemesis last night only. No recurrence of hematemesis today. No known dark or tarry stools, has not had bowel movement today. Hemoglobin notable for 0.4 g difference tonight versus most recent reading.  Hemodynamically stable. Differential diagnosis includes gastritis with esophagitis, versus cholelithiasis/cholecystitis with right upper quadrant pain. She is afebrile and normal white blood count today.   -Start Carafate 4 times a day, fluids only for by mouth at this time. Prilosec 40 mg daily. Avoid any NSAIDs for now.  -Check CMP, lipase. Abdominal ultrasound tomorrow morning.  -RTC/ER precautions discussed as below. Otherwise will follow-up with her Tuesday morning for repeat exam and CBC.   Meds ordered this encounter  Medications  . sucralfate (CARAFATE) 1 G tablet    Sig: Take 1 tablet (1 g total) by mouth 4 (four) times daily -  with meals and at bedtime.    Dispense:  40 tablet    Refill:  0  . omeprazole (PRILOSEC) 40 MG capsule    Sig: Take 1 capsule (40 mg total) by mouth daily.    Dispense:  30 capsule    Refill:  0   Patient Instructions  Start omeprazole once per day, carafate 4 times per day, fluids only for now. I sent off a CMP and lipase to look at your liver tests and pancreas. We will have those results back in the next few days.  Will check ultrasound  tomorrow and we'll call you with that time, then follow up with me Tuesday morning to repeat your blood count and repeat exam. You will likely need to follow-up with the gastroenterologist, but can discuss this further at follow-up.  If you have any increased abdominal pain overnight, return of any vomiting with blood in it, dark or tarry stools, lightheadedness, dizziness, or otherwise worsening, go to the emergency room.  Abdominal Pain, Adult Many things can cause abdominal pain. Usually, abdominal pain is not caused by a disease and will improve without treatment. It can often be observed and treated at home. Your health care provider will do a physical exam and possibly order blood tests and X-rays to help determine the seriousness of your pain. However, in many cases, more time must pass before a clear cause of the pain can be found. Before that point, your health care provider may not know if you need more testing or further treatment. HOME CARE INSTRUCTIONS Monitor your abdominal pain for any changes. The following actions may help to alleviate any discomfort you are experiencing:  Only take over-the-counter or prescription medicines as directed by your health care provider.  Do not take laxatives unless directed to do so by your health care provider.  Try a clear liquid diet (broth, tea, or water) as directed by your health care provider. Slowly move to a bland diet as tolerated. SEEK MEDICAL CARE IF:  You have unexplained abdominal pain.  You have abdominal pain associated with nausea or diarrhea.  You have pain when you urinate or have a bowel movement.  You experience abdominal pain that wakes you in the night.  You have abdominal pain that is worsened or improved by eating food.  You have abdominal pain that is worsened with eating fatty foods.  You have a fever. SEEK IMMEDIATE MEDICAL CARE IF:  Your pain does not go away within 2 hours.  You keep throwing  up  (vomiting).  Your pain is felt only in portions of the abdomen, such as the right side or the left lower portion of the abdomen.  You pass bloody or black tarry stools. MAKE SURE YOU:  Understand these instructions.  Will watch your condition.  Will get help right away if you are not doing well or get worse.   This information is not intended to replace advice given to you by your health care provider. Make sure you discuss any questions you have with your health care provider.   Document Released: 10/10/2004 Document Revised: 09/21/2014 Document Reviewed: 09/09/2012 Elsevier Interactive Patient Education 2016 [employer]  Nausea and Vomiting Nausea is a sick feeling that often comes before throwing up (vomiting). Vomiting is a reflex where stomach contents come out of your mouth. Vomiting can cause severe loss of body fluids (dehydration). Children and elderly adults can become dehydrated quickly, especially if they also have diarrhea. Nausea and vomiting are symptoms of a condition or disease. It is important to find the cause of your symptoms. CAUSES  Direct irritation of the stomach lining. This irritation can result from increased acid production (gastroesophageal reflux disease), infection, food poisoning, taking certain medicines (such as nonsteroidal anti-inflammatory drugs), alcohol use, or tobacco use. Signals from the brain.These signals could be caused by a headache, heat exposure, an inner ear disturbance, increased pressure in the brain from injury, infection, a tumor, or a concussion, pain, emotional stimulus, or metabolic problems. An obstruction in the gastrointestinal tract (bowel obstruction). Illnesses such as diabetes, hepatitis, gallbladder problems, appendicitis, kidney problems, cancer, sepsis, atypical symptoms of a heart attack, or eating disorders. Medical treatments such as chemotherapy and radiation. Receiving medicine that makes you sleep (general  anesthetic) during surgery. DIAGNOSIS Your caregiver may ask for tests to be done if the problems do not improve after a few days. Tests may also be done if symptoms are severe or if the reason for the nausea and vomiting is not clear. Tests may include: Urine tests. Blood tests. Stool tests. Cultures (to look for evidence of infection). X-rays or other imaging studies. Test results can help your caregiver make decisions about treatment or the need for additional tests. TREATMENT You need to stay well hydrated. Drink frequently but in small amounts.You may wish to drink water, sports drinks, clear broth, or eat frozen ice pops or gelatin dessert to help stay hydrated.When you eat, eating slowly may help prevent nausea.There are also some antinausea medicines that may help prevent nausea. HOME CARE INSTRUCTIONS  Take all medicine as directed by your caregiver. If you do not have an appetite, do not force yourself to eat. However, you must continue to drink fluids. If you have an appetite, eat a normal diet unless your caregiver tells you differently. Eat a variety of complex carbohydrates (rice, wheat, potatoes, bread), lean meats, yogurt, fruits, and vegetables. Avoid high-fat foods because they are more difficult to digest. Drink enough water and fluids to keep your urine clear or pale yellow. If you are dehydrated, ask your caregiver for specific rehydration instructions. Signs of dehydration may include: Severe thirst. Dry lips and mouth. Dizziness. Dark urine. Decreasing urine frequency and amount. Confusion. Rapid breathing or pulse. SEEK IMMEDIATE MEDICAL CARE IF:  You have blood or brown flecks (like coffee grounds) in your vomit. You have black or bloody stools. You have a severe headache or stiff neck. You are confused. You have severe abdominal pain. You have chest pain or trouble breathing. You do  not urinate at least once every 8 hours. You develop cold or clammy  skin. You continue to vomit for longer than 24 to 48 hours. You have a fever. MAKE SURE YOU:  Understand these instructions. Will watch your condition. Will get help right away if you are not doing well or get worse.   This information is not intended to replace advice given to you by your health care provider. Make sure you discuss any questions you have with your health care provider.   Document Released: 12/31/2004 Document Revised: 03/25/2011 Document Reviewed: 05/30/2010 Elsevier Interactive Patient Education [employer].         By signing my name below, I, Moises Blood, attest that this documentation has been prepared under the direction and in the presence of Merri Ray, MD. Electronically Signed: Moises Blood, [place]. 11/20/2014 , 2:35 PM .

## 2014-11-20 NOTE — Patient Instructions (Addendum)
Start omeprazole once per day, carafate 4 times per day, fluids only for now. I sent off a CMP and lipase to look at your liver tests and pancreas. We will have those results back in the next few days.  Will check ultrasound tomorrow and we'll call you with that time, then follow up with me Tuesday morning to repeat your blood count and repeat exam. You will likely need to follow-up with the gastroenterologist, but can discuss this further at follow-up.  If you have any increased abdominal pain overnight, return of any vomiting with blood in it, dark or tarry stools, lightheadedness, dizziness, or otherwise worsening, go to the emergency room.  Abdominal Pain, Adult Many things can cause abdominal pain. Usually, abdominal pain is not caused by a disease and will improve without treatment. It can often be observed and treated at home. Your health care provider will do a physical exam and possibly order blood tests and X-rays to help determine the seriousness of your pain. However, in many cases, more time must pass before a clear cause of the pain can be found. Before that point, your health care provider may not know if you need more testing or further treatment. HOME CARE INSTRUCTIONS Monitor your abdominal pain for any changes. The following actions may help to alleviate any discomfort you are experiencing:  Only take over-the-counter or prescription medicines as directed by your health care provider.  Do not take laxatives unless directed to do so by your health care provider.  Try a clear liquid diet (broth, tea, or water) as directed by your health care provider. Slowly move to a bland diet as tolerated. SEEK MEDICAL CARE IF:  You have unexplained abdominal pain.  You have abdominal pain associated with nausea or diarrhea.  You have pain when you urinate or have a bowel movement.  You experience abdominal pain that wakes you in the night.  You have abdominal pain that is worsened or  improved by eating food.  You have abdominal pain that is worsened with eating fatty foods.  You have a fever. SEEK IMMEDIATE MEDICAL CARE IF:  Your pain does not go away within 2 hours.  You keep throwing up (vomiting).  Your pain is felt only in portions of the abdomen, such as the right side or the left lower portion of the abdomen.  You pass bloody or black tarry stools. MAKE SURE YOU:  Understand these instructions.  Will watch your condition.  Will get help right away if you are not doing well or get worse.   This information is not intended to replace advice given to you by your health care provider. Make sure you discuss any questions you have with your health care provider.   Document Released: 10/10/2004 Document Revised: 09/21/2014 Document Reviewed: 09/09/2012 Elsevier Interactive Patient Education 2016 Elsevier Inc.  Nausea and Vomiting Nausea is a sick feeling that often comes before throwing up (vomiting). Vomiting is a reflex where stomach contents come out of your mouth. Vomiting can cause severe loss of body fluids (dehydration). Children and elderly adults can become dehydrated quickly, especially if they also have diarrhea. Nausea and vomiting are symptoms of a condition or disease. It is important to find the cause of your symptoms. CAUSES  Direct irritation of the stomach lining. This irritation can result from increased acid production (gastroesophageal reflux disease), infection, food poisoning, taking certain medicines (such as nonsteroidal anti-inflammatory drugs), alcohol use, or tobacco use. Signals from the brain.These signals could be caused  by a headache, heat exposure, an inner ear disturbance, increased pressure in the brain from injury, infection, a tumor, or a concussion, pain, emotional stimulus, or metabolic problems. An obstruction in the gastrointestinal tract (bowel obstruction). Illnesses such as diabetes, hepatitis, gallbladder problems,  appendicitis, kidney problems, cancer, sepsis, atypical symptoms of a heart attack, or eating disorders. Medical treatments such as chemotherapy and radiation. Receiving medicine that makes you sleep (general anesthetic) during surgery. DIAGNOSIS Your caregiver may ask for tests to be done if the problems do not improve after a few days. Tests may also be done if symptoms are severe or if the reason for the nausea and vomiting is not clear. Tests may include: Urine tests. Blood tests. Stool tests. Cultures (to look for evidence of infection). X-rays or other imaging studies. Test results can help your caregiver make decisions about treatment or the need for additional tests. TREATMENT You need to stay well hydrated. Drink frequently but in small amounts.You may wish to drink water, sports drinks, clear broth, or eat frozen ice pops or gelatin dessert to help stay hydrated.When you eat, eating slowly may help prevent nausea.There are also some antinausea medicines that may help prevent nausea. HOME CARE INSTRUCTIONS  Take all medicine as directed by your caregiver. If you do not have an appetite, do not force yourself to eat. However, you must continue to drink fluids. If you have an appetite, eat a normal diet unless your caregiver tells you differently. Eat a variety of complex carbohydrates (rice, wheat, potatoes, bread), lean meats, yogurt, fruits, and vegetables. Avoid high-fat foods because they are more difficult to digest. Drink enough water and fluids to keep your urine clear or pale yellow. If you are dehydrated, ask your caregiver for specific rehydration instructions. Signs of dehydration may include: Severe thirst. Dry lips and mouth. Dizziness. Dark urine. Decreasing urine frequency and amount. Confusion. Rapid breathing or pulse. SEEK IMMEDIATE MEDICAL CARE IF:  You have blood or brown flecks (like coffee grounds) in your vomit. You have black or bloody stools. You  have a severe headache or stiff neck. You are confused. You have severe abdominal pain. You have chest pain or trouble breathing. You do not urinate at least once every 8 hours. You develop cold or clammy skin. You continue to vomit for longer than 24 to 48 hours. You have a fever. MAKE SURE YOU:  Understand these instructions. Will watch your condition. Will get help right away if you are not doing well or get worse.   This information is not intended to replace advice given to you by your health care provider. Make sure you discuss any questions you have with your health care provider.   Document Released: 12/31/2004 Document Revised: 03/25/2011 Document Reviewed: 05/30/2010 Elsevier Interactive Patient Education [employer].

## 2014-11-21 ENCOUNTER — Other Ambulatory Visit: Payer: Self-pay

## 2014-11-21 NOTE — Addendum Note (Signed)
Addended by: Orion Crook on: 11/21/2014 10:24 AM   Modules accepted: Orders

## 2014-11-22 ENCOUNTER — Ambulatory Visit (INDEPENDENT_AMBULATORY_CARE_PROVIDER_SITE_OTHER): Payer: BC Managed Care – PPO

## 2014-11-22 ENCOUNTER — Telehealth: Payer: Self-pay

## 2014-11-22 ENCOUNTER — Ambulatory Visit
Admission: RE | Admit: 2014-11-22 | Discharge: 2014-11-22 | Disposition: A | Discharge status: Home / Self Care | Payer: BC Managed Care – PPO | Source: Ambulatory Visit | Attending: Family Medicine | Admitting: Family Medicine

## 2014-11-22 DIAGNOSIS — R140 Abdominal distension (gaseous): Secondary | ICD-10-CM

## 2014-11-22 DIAGNOSIS — K920 Hematemesis: Secondary | ICD-10-CM

## 2014-11-22 DIAGNOSIS — R1011 Right upper quadrant pain: Secondary | ICD-10-CM

## 2014-11-22 NOTE — Telephone Encounter (Signed)
PATIENT STATES SHE CAME INTO THE OFFICE AND SAW DR. Carlota Raspberry TODAY. SHE WAS SENT FOR AN ULTRASOUND AT [facility]. SHE WOULD LIKE TO GET THE RESULTS AND FIND OUT WHAT HER NEXT STEP WILL BE. BEST PHONE [phone] (CELL)  [facility] [address].  [place]

## 2014-11-23 NOTE — Telephone Encounter (Signed)
See cma/rad staff note.

## 2014-11-24 ENCOUNTER — Telehealth: Payer: Self-pay

## 2014-11-24 ENCOUNTER — Other Ambulatory Visit: Payer: Self-pay

## 2014-11-24 NOTE — Telephone Encounter (Signed)
Per instructions from Dr. Carlota Raspberry, pt was to recheck and have labs 11/22/14. Pt came in , requested labs be drawn and she would return for visit with Dr. Carlota Raspberry later in the day. Pt did not return for visit, labs were not run. Pt aware visit was needed. Dewitt Hoes

## 2014-11-25 ENCOUNTER — Ambulatory Visit (INDEPENDENT_AMBULATORY_CARE_PROVIDER_SITE_OTHER): Payer: BC Managed Care – PPO | Admitting: Family Medicine

## 2014-11-25 VITALS — BP 118/76 | HR 79 | Temp 98.8°F | Resp 16 | Ht 63.0 in | Wt 179.2 lb

## 2014-11-25 DIAGNOSIS — K8020 Calculus of gallbladder without cholecystitis without obstruction: Secondary | ICD-10-CM

## 2014-11-25 DIAGNOSIS — D649 Anemia, unspecified: Secondary | ICD-10-CM

## 2014-11-25 NOTE — Progress Notes (Signed)
@UMFCLOGO @  This chart was scribed for Robyn Haber, MD by Thea Alken, ED Scribe. This patient was seen in room 10 and the patient's care was started at 9:33 AM.  Patient ID: Stephanie Nunez MRN: [id number], DOB: [birth year], 54 y.o. Date of Encounter: 11/25/2014, 9:33 AM  Primary Physician: No PCP Per Patient  Chief Complaint:  Chief Complaint  Patient presents with   Follow-up    pt. here about lab results from 11/22/14    HPI: 54 y.o. year old female with history below presents for a follow up. Pt was seen here 3 days ago for abdominal pain, vomiting and constipation that started 1 day prior to that visit. XR at that visit was normal.  She's had improved symptoms since last visit and feels as if she is heading in the right direction. Reports having her first BM yesterday since Sunday and another BM today. She has improved abdominal pain but still has some areas of tenderness. She has been eating chicken noodle soup with only the noodles and broth. Has not had meat this week. She has not had colonoscopy. No measured fever.   Pt is a Pharmacist, hospital.  Past Medical History  Diagnosis Date   Gestational diabetes mellitus    Uterine fibroid    Diabetes type 2, controlled ([facility])    Hyperlipidemia    Hypertension    Allergy      Home Meds: Prior to Admission medications   Medication Sig Start Date End Date Taking? Authorizing Provider  aspirin 81 MG tablet Take 81 mg by mouth daily.   Yes Historical Provider, MD  azelastine (ASTELIN) 0.1 % nasal spray PLACE 2 SPRAYS IN EACH NOSTRIL TWICE DAILY AS DIRECTED 10/11/14  Yes Elby Beck, FNP  fluticasone [facility]) 50 MCG/ACT nasal spray PLACE 2 SPRAYS INTO THE NOSE DAILY 10/11/14  Yes Elby Beck, FNP  lisinopril-hydrochlorothiazide (PRINZIDE,ZESTORETIC) 20-12.5 MG per tablet Take 1 tablet by mouth daily. 10/11/14  Yes Elby Beck, FNP  metFORMIN (GLUCOPHAGE) 1000 MG tablet Take 1 tablet (1,000 mg total) by mouth 2 (two)  times daily with a meal. 10/11/14  Yes Elby Beck, FNP  niacin (NIASPAN) 750 MG CR tablet Take 2 tablets (1,500 mg total) by mouth at bedtime. 10/11/14  Yes Elby Beck, FNP  omeprazole (PRILOSEC) 40 MG capsule Take 1 capsule (40 mg total) by mouth daily. 11/20/14  Yes [name], MD  sucralfate (CARAFATE) 1 G tablet Take 1 tablet (1 g total) by mouth 4 (four) times daily -  with meals and at bedtime. 11/20/14  Yes [name], MD    Allergies:  Allergies  Allergen Reactions   Simvastatin     myalgia   Statins     Severe musculoskeletal pains.    Social History   Social History   Marital Status: Married    Spouse Name: Gerald Stabs   Number of Children: 1   Years of Education: 38   Occupational History   homemaker    Social History Main Topics   Smoking status: Never Smoker    Smokeless tobacco: Never Used   Alcohol Use: 2.4 oz/week    4 Glasses of wine per week   Drug Use: No   Sexual Activity:    Partners: Male    Patent examiner Protection: Condom   Other Topics Concern   Not on file   Social History Narrative   Lives with her husband and their daughter.  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     Review of Systems: Constitutional: negative for chills, fever, night sweats, weight changes, or fatigue  HEENT: negative for vision changes, hearing loss, congestion, rhinorrhea, ST, epistaxis, or sinus pressure Cardiovascular: negative for chest pain or palpitations Respiratory: negative for hemoptysis, wheezing, shortness of breath, or cough Abdominal: negative for abdominal pain, nausea, vomiting, diarrhea, or constipation Dermatological: negative for rash Neurologic: negative for headache, dizziness, or syncope All other systems reviewed and are otherwise negative with the exception to those above and in the HPI.   Physical Exam: Blood pressure 118/76, pulse 79, temperature 98.8 F (37.1 C), temperature source Oral, resp. rate  16, height 5\' 3"  (1.6 m), weight 179 lb 3.2 oz (81.285 kg), last menstrual period 11/06/2014, SpO2 95 %., Body mass index is 31.75 kg/(m^2). General: Well developed, well nourished, in no acute distress. Head: Normocephalic, atraumatic, eyes without discharge, sclera non-icteric, nares are without discharge. Bilateral auditory canals clear, TM's are without perforation, pearly grey and translucent with reflective cone of light bilaterally. Oral cavity moist, posterior pharynx without exudate, erythema, peritonsillar abscess, or post nasal drip.  Neck: Supple. No thyromegaly. Full ROM. No lymphadenopathy. Lungs: Clear bilaterally to auscultation without wheezes, rales, or rhonchi. Breathing is unlabored. Heart: RRR with S1 S2. No murmurs, rubs, or gallops appreciated. Abdomen: Soft, mild RUQ pain., non-distended with normoactive bowel sounds. No hepatomegaly. No rebound/guarding. No obvious abdominal masses. Msk:  Strength and tone normal for age. Extremities/Skin: Warm and dry. No clubbing or cyanosis. No edema. No rashes or suspicious lesions. Neuro: Alert and oriented X 3. Moves all extremities spontaneously. Gait is normal. CNII-XII grossly in tact. Psych:  Responds to questions appropriately with a normal affect.    ASSESSMENT AND PLAN:  54 y.o. year old female with     ICD-9-CM ICD-10-CM   1. Anemia, unspecified anemia type 285.9 D64.9   2. Gallstones 574.20 K80.20    -colonoscopy This chart was scribed in my presence and reviewed by me personally. Signed, Robyn Haber, MD 11/25/2014 9:33 AM

## 2014-11-25 NOTE — Patient Instructions (Signed)
Results for orders placed or performed in visit on 11/20/14  COMPLETE METABOLIC PANEL WITH GFR  Result Value Ref Range   Sodium 130 (L) 135 - 146 mmol/L   Potassium 3.2 (L) 3.5 - 5.3 mmol/L   Chloride 96 (L) 98 - 110 mmol/L   CO2 29 20 - 31 mmol/L   Glucose, Bld 129 (H) 65 - 99 mg/dL   BUN 6 (L) 7 - 25 mg/dL   Creat 0.55 0.50 - 1.05 mg/dL   Total Bilirubin 0.8 0.2 - 1.2 mg/dL   Alkaline Phosphatase 51 33 - 130 U/L   AST 16 10 - 35 U/L   ALT 10 6 - 29 U/L   Total Protein 6.7 6.1 - 8.1 g/dL   Albumin 4.0 3.6 - 5.1 g/dL   Calcium 8.6 8.6 - 10.4 mg/dL   GFR, Est African American >89 >=60 mL/min   GFR, Est Non African American >89 >=60 mL/min  Lipase  Result Value Ref Range   Lipase 34 7 - 60 U/L  POCT CBC  Result Value Ref Range   WBC 7.1 4.6 - 10.2 K/uL   Lymph, poc 1.7 0.6 - 3.4   POC LYMPH PERCENT 23.4 10 - 50 %L   MID (cbc) 0.2 0 - 0.9   POC MID % 3.1 0 - 12 %M   POC Granulocyte 5.2 2 - 6.9   Granulocyte percent 73.5 37 - 80 %G   RBC 4.10 4.04 - 5.48 M/uL   Hemoglobin 10.7 (A) 12.2 - 16.2 g/dL   HCT, POC 32.6 (A) 37.7 - 47.9 %   MCV 79.6 (A) 80 - 97 fL   MCH, POC 26.2 (A) 27 - 31.2 pg   MCHC 32.9 31.8 - 35.4 g/dL   RDW, POC 15.4 %   Platelet Count, POC 433 (A) 142 - 424 K/uL   MPV 6.5 0 - 99.8 fL  POCT glucose (manual entry)  Result Value Ref Range   POC Glucose 130 (A) 70 - 99 mg/dl   Study Result     CLINICAL DATA: Right upper quadrant epigastric pain with nausea and vomiting for the past 4 days, history of diabetes.  EXAM: ULTRASOUND ABDOMEN COMPLETE  COMPARISON: None in PACs  FINDINGS: Gallbladder: The gallbladder is adequately distended. There are multiple echogenic mobile shadowing stones. There is no gallbladder wall thickening, pericholecystic fluid, or positive sonographic Murphy's sign.  Common bile duct: Diameter: 4.1 mm  Liver: No focal lesion identified. Within normal limits in parenchymal echogenicity.  IVC: No abnormality  visualized.  Pancreas: Visualized portion unremarkable.  Spleen: Size and appearance within normal limits.  Right Kidney: Length: 12.4 cm. Echogenicity within normal limits. No mass or hydronephrosis visualized.  Left Kidney: Length: 11.6 cm. Echogenicity within normal limits. No mass or hydronephrosis visualized.  Abdominal aorta: No aneurysm visualized.  Other findings: There is no ascites.  IMPRESSION: 1. Gallstones without sonographic evidence of acute cholecystitis. 2. No acute abnormality is observed elsewhere within the abdomen.   Electronically Signed  By: David Martinique M.D.  On: 11/22/2014 10:28

## 2014-11-28 ENCOUNTER — Encounter: Payer: Self-pay | Admitting: Gastroenterology

## 2014-12-07 NOTE — Addendum Note (Signed)
Addended by: Carlota Raspberry, Harvis Mabus R on: 12/07/2014 01:00 AM   Modules accepted: Orders

## 2014-12-10 ENCOUNTER — Other Ambulatory Visit (INDEPENDENT_AMBULATORY_CARE_PROVIDER_SITE_OTHER): Payer: BC Managed Care – PPO | Admitting: *Deleted

## 2014-12-10 DIAGNOSIS — E876 Hypokalemia: Secondary | ICD-10-CM

## 2014-12-10 DIAGNOSIS — E871 Hypo-osmolality and hyponatremia: Secondary | ICD-10-CM | POA: Diagnosis not present

## 2014-12-10 LAB — BASIC METABOLIC PANEL
BUN: 6 mg/dL — AB (ref 7–25)
CALCIUM: 9.1 mg/dL (ref 8.6–10.4)
CO2: 30 mmol/L (ref 20–31)
CREATININE: 0.58 mg/dL (ref 0.50–1.05)
Chloride: 99 mmol/L (ref 98–110)
Glucose, Bld: 114 mg/dL — ABNORMAL HIGH (ref 65–99)
POTASSIUM: 4.3 mmol/L (ref 3.5–5.3)
Sodium: 136 mmol/L (ref 135–146)

## 2014-12-10 NOTE — Progress Notes (Signed)
Patient here today for lab only. BMP drawn from left forearm patient tolerated well.

## 2014-12-22 ENCOUNTER — Ambulatory Visit (AMBULATORY_SURGERY_CENTER): Payer: Self-pay | Admitting: *Deleted

## 2014-12-22 VITALS — Ht 62.75 in | Wt 186.0 lb

## 2014-12-22 DIAGNOSIS — Z1211 Encounter for screening for malignant neoplasm of colon: Secondary | ICD-10-CM

## 2014-12-22 MED ORDER — NA SULFATE-K SULFATE-MG SULF 17.5-3.13-1.6 GM/177ML PO SOLN: 1.0000 | Freq: Once | ORAL | Status: DC

## 2014-12-22 NOTE — Progress Notes (Signed)
No egg or soy allergy known to patient  No issues with past sedation with any surgeries  or procedures, no intubation problems  No diet pills No home 02 use per patient   emmi declined   

## 2014-12-23 ENCOUNTER — Encounter: Payer: Self-pay | Admitting: Gastroenterology

## 2015-01-06 ENCOUNTER — Encounter: Payer: BC Managed Care – PPO | Admitting: Gastroenterology

## 2015-04-11 ENCOUNTER — Ambulatory Visit: Payer: BC Managed Care – PPO | Admitting: Family Medicine

## 2015-05-17 ENCOUNTER — Other Ambulatory Visit: Payer: Self-pay | Admitting: Family Medicine

## 2015-07-30 ENCOUNTER — Other Ambulatory Visit: Payer: Self-pay | Admitting: Family Medicine

## 2015-08-31 ENCOUNTER — Other Ambulatory Visit: Payer: Self-pay | Admitting: Physician Assistant

## 2015-09-03 ENCOUNTER — Other Ambulatory Visit: Payer: Self-pay | Admitting: Physician Assistant

## 2015-09-09 ENCOUNTER — Ambulatory Visit (INDEPENDENT_AMBULATORY_CARE_PROVIDER_SITE_OTHER): Payer: BC Managed Care – PPO | Admitting: Physician Assistant

## 2015-09-09 VITALS — BP 132/80 | HR 86 | Temp 98.5°F | Resp 16 | Ht 62.75 in | Wt 191.4 lb

## 2015-09-09 DIAGNOSIS — Z23 Encounter for immunization: Secondary | ICD-10-CM

## 2015-09-09 DIAGNOSIS — E119 Type 2 diabetes mellitus without complications: Secondary | ICD-10-CM

## 2015-09-09 DIAGNOSIS — R609 Edema, unspecified: Secondary | ICD-10-CM

## 2015-09-09 DIAGNOSIS — Z114 Encounter for screening for human immunodeficiency virus [HIV]: Secondary | ICD-10-CM

## 2015-09-09 DIAGNOSIS — E785 Hyperlipidemia, unspecified: Secondary | ICD-10-CM

## 2015-09-09 DIAGNOSIS — Z1211 Encounter for screening for malignant neoplasm of colon: Secondary | ICD-10-CM

## 2015-09-09 DIAGNOSIS — I10 Essential (primary) hypertension: Secondary | ICD-10-CM | POA: Diagnosis not present

## 2015-09-09 DIAGNOSIS — J302 Other seasonal allergic rhinitis: Secondary | ICD-10-CM

## 2015-09-09 LAB — COMPLETE METABOLIC PANEL WITH GFR
ALT: 11 U/L (ref 6–29)
AST: 17 U/L (ref 10–35)
Albumin: 4.4 g/dL (ref 3.6–5.1)
Alkaline Phosphatase: 46 U/L (ref 33–130)
BUN: 6 mg/dL — AB (ref 7–25)
CALCIUM: 8.7 mg/dL (ref 8.6–10.4)
CHLORIDE: 102 mmol/L (ref 98–110)
CO2: 26 mmol/L (ref 20–31)
Creat: 0.64 mg/dL (ref 0.50–1.05)
GFR, Est Non African American: >89 mL/min (ref >=60)
Glucose, Bld: 104 mg/dL — ABNORMAL HIGH (ref 65–99)
POTASSIUM: 3.9 mmol/L (ref 3.5–5.3)
Sodium: 139 mmol/L (ref 135–146)
Total Bilirubin: 0.8 mg/dL (ref 0.2–1.2)
Total Protein: 7 g/dL (ref 6.1–8.1)

## 2015-09-09 LAB — CBC WITH DIFFERENTIAL/PLATELET
BASOS ABS: 49 {cells}/uL (ref 0–200)
Basophils Relative: 1 %
EOS ABS: 98 {cells}/uL (ref 15–500)
Eosinophils Relative: 2 %
HCT: 33.3 % — ABNORMAL LOW (ref 35.0–45.0)
Hemoglobin: 10.8 g/dL — ABNORMAL LOW (ref 11.7–15.5)
LYMPHS PCT: 42 %
Lymphs Abs: 2058 cells/uL (ref 850–3900)
MCH: 26 pg — AB (ref 27.0–33.0)
MCHC: 32.4 g/dL (ref 32.0–36.0)
MCV: 80 fL (ref 80.0–100.0)
MONOS PCT: 8 %
MPV: 9 fL (ref 7.5–12.5)
Monocytes Absolute: 392 cells/uL (ref 200–950)
Neutro Abs: 2303 cells/uL (ref 1500–7800)
Neutrophils Relative %: 47 %
PLATELETS: 521 10*3/uL — AB (ref 140–400)
RBC: 4.16 MIL/uL (ref 3.80–5.10)
RDW: 16.2 % — ABNORMAL HIGH (ref 11.0–15.0)
WBC: 4.9 10*3/uL (ref 3.8–10.8)

## 2015-09-09 LAB — LIPID PANEL
CHOL/HDL RATIO: 3.6 ratio (ref <=5.0)
CHOLESTEROL: 237 mg/dL — AB (ref 125–200)
HDL: 66 mg/dL (ref >=46)
LDL Cholesterol: 161 mg/dL — ABNORMAL HIGH (ref <130)
TRIGLYCERIDES: 49 mg/dL (ref <150)
VLDL: 10 mg/dL (ref <30)

## 2015-09-09 LAB — MICROALBUMIN, URINE: Microalb, Ur: 1 mg/dL

## 2015-09-09 LAB — HIV ANTIBODY (ROUTINE TESTING W REFLEX): HIV 1&2 Ab, 4th Generation: NONREACTIVE

## 2015-09-09 MED ORDER — HYDROCHLOROTHIAZIDE 25 MG PO TABS: 25.0000 mg | ORAL_TABLET | Freq: Every day | ORAL | 1 refills | Status: DC

## 2015-09-09 MED ORDER — AZELASTINE HCL 0.1 % NA SOLN: NASAL | 3 refills | Status: DC

## 2015-09-09 MED ORDER — NIACIN ER (ANTIHYPERLIPIDEMIC) 750 MG PO TBCR: 1500.0000 mg | EXTENDED_RELEASE_TABLET | Freq: Every day | ORAL | 1 refills | Status: DC

## 2015-09-09 MED ORDER — METFORMIN HCL 1000 MG PO TABS: 1000.0000 mg | ORAL_TABLET | Freq: Two times a day (BID) | ORAL | 1 refills | Status: DC

## 2015-09-09 MED ORDER — FLUTICASONE PROPIONATE 50 MCG/ACT NA SUSP: NASAL | 3 refills | Status: DC

## 2015-09-09 NOTE — Progress Notes (Signed)
AILED HANAS  MRN: [id number] DOB: 08/24/1960  Subjective:  Pt presents to clinic for medication refill.  She has been out of her BP medications for a week and she has felt fine expect she feels like her feet or swelling more towards the end of the day and her face is a little puffy.    She has been doing really good.  She exercises several times during the week.  She tries to eat healthy and drinks only water and coffee.  She does not check her BS or her BP at home.  Left foot burning intermittent has not changed.  Review of Systems  Constitutional: Negative for chills and fever.  HENT: Negative.   Respiratory: Negative for shortness of breath.   Cardiovascular: Negative for chest pain, palpitations and leg swelling.  Skin: Negative for wound.  Neurological: Negative for numbness and headaches.    Patient Active Problem List   Diagnosis Date Noted  . HTN (hypertension) 02/01/2011  . Hyperlipemia 02/01/2011  . Obesity (BMI 30-39.9) 02/01/2011  . DM type 2 (diabetes mellitus, type 2) ([place]) 02/01/2011    Current Outpatient Prescriptions on File Prior to Visit  Medication Sig Dispense Refill  . aspirin 81 MG tablet Take 81 mg by mouth daily.     No current facility-administered medications on file prior to visit.     Allergies  Allergen Reactions  . Simvastatin     myalgia  . Statins     Severe musculoskeletal pains.    Pt patients past, family and social history were reviewed and updated.  Objective:  BP 132/80 (BP Location: Left Arm, Patient Position: Sitting, Cuff Size: Normal)   Pulse 86   Temp 98.5 F (36.9 C) (Oral)   Resp 16   Ht 5' 2.75" (1.594 m)   Wt 191 lb 6.4 oz (86.8 kg)   SpO2 98%   BMI 34.18 kg/m   Physical Exam  Constitutional: She is oriented to person, place, and time and well-developed, well-nourished, and in no distress.  HENT:  Head: Normocephalic and atraumatic.  Right Ear: Hearing and external ear normal.  Left Ear: Hearing and  external ear normal.  Eyes: Conjunctivae are normal.  Neck: Normal range of motion.  Cardiovascular: Normal rate, regular rhythm and normal heart sounds.   No murmur heard. Pulmonary/Chest: Effort normal and breath sounds normal.  Musculoskeletal:       Right lower leg: She exhibits no edema.       Left lower leg: She exhibits no edema.  Neurological: She is alert and oriented to person, place, and time. Gait normal.  Skin: Skin is warm and dry.  Diabetic Foot Exam - Simple   Simple Foot Form Diabetic Foot exam was performed with the following findings:  Yes  09/09/2015  9:22 AM  Visual Inspection No deformities, no ulcerations, no other skin breakdown bilaterally:  Yes Sensation Testing Intact to touch and monofilament testing bilaterally:  Yes Pulse Check Posterior Tibialis and Dorsalis pulse intact bilaterally:  Yes Comments  Psychiatric: Mood, memory, affect and judgment normal.  Vitals reviewed.   Assessment and Plan :  Type 2 diabetes mellitus without complication, without long-term current use of insulin (HCC) - Plan: Hemoglobin A1c, COMPLETE METABOLIC PANEL WITH GFR, metFORMIN (GLUCOPHAGE) 1000 MG tablet, Microalbumin, urine  Hyperlipemia - Plan: Lipid panel, niacin (NIASPAN) 750 MG CR tablet  Other seasonal allergic rhinitis - Plan: fluticasone (FLONASE) 50 MCG/ACT nasal spray, azelastine (ASTELIN) 0.1 % nasal spray  Essential hypertension -  Plan: hydrochlorothiazide (HYDRODIURIL) 25 MG tablet  Swelling - Plan: CBC with Differential/Platelet  Encounter for screening for HIV - Plan: HIV antibody  Need for prophylactic vaccination against Streptococcus pneumoniae (pneumococcus) - Plan: Pneumococcal polysaccharide vaccine 23-valent greater than or equal to 2yo subcutaneous/IM  Need for Tdap vaccination - Plan: Tdap vaccine greater than or equal to 7yo IM  Need for influenza vaccination - Plan: Flu Vaccine QUAD 36+ mos IM  Screen for colon cancer - Plan: Ambulatory  referral to Gastroenterology   Health maintenance completed.  Medications refilled.  Continue healthy eating and exercising.  Windell Hummingbird PA-C  [facility] 09/09/2015 9:22 AM

## 2015-09-09 NOTE — Patient Instructions (Addendum)
  Monitor your BP in the am and at night because the HCTZ does not last a full 24 hours and we are using it for your swelling - if you BP seems to be running high (>135/85) we will need to place you back on the lisinopril portion of your BP medication that we stopped today.   IF you received an x-ray today, you will receive an invoice from [facility]. Please contact [facility] at [phone] with questions or concerns regarding your invoice.   IF you received labwork today, you will receive an invoice from [employer]. Please contact [facility] at [phone] with questions or concerns regarding your invoice.   Our billing staff will not be able to assist you with questions regarding bills from these companies.  You will be contacted with the lab results as soon as they are available. The fastest way to get your results is to activate your My Chart account. Instructions are located on the last page of this paperwork. If you have not heard from [place] regarding the results in 2 weeks, please contact this office.

## 2015-09-10 LAB — HEMOGLOBIN A1C
HEMOGLOBIN A1C: 6.4 % — AB (ref <5.7)
Mean Plasma Glucose: 137 mg/dL

## 2015-09-26 ENCOUNTER — Encounter: Payer: Self-pay | Admitting: Gastroenterology

## 2015-10-14 ENCOUNTER — Other Ambulatory Visit: Payer: Self-pay | Admitting: Family Medicine

## 2015-10-14 DIAGNOSIS — E785 Hyperlipidemia, unspecified: Secondary | ICD-10-CM

## 2015-11-06 ENCOUNTER — Other Ambulatory Visit: Payer: Self-pay | Admitting: Family Medicine

## 2015-11-22 ENCOUNTER — Ambulatory Visit: Payer: BC Managed Care – PPO | Admitting: *Deleted

## 2015-11-22 VITALS — Ht 62.0 in | Wt 186.0 lb

## 2015-11-22 DIAGNOSIS — Z1211 Encounter for screening for malignant neoplasm of colon: Secondary | ICD-10-CM

## 2015-11-22 MED ORDER — SUPREP BOWEL PREP KIT 17.5-3.13-1.6 GM/177ML PO SOLN: 1.0000 | Freq: Once | ORAL | 0 refills | Status: AC

## 2015-11-22 NOTE — Progress Notes (Signed)
Patient denies any allergies to egg or soy products. Patient denies complications with anesthesia/sedation.  Patient denies oxygen use at home and denies diet medications. Emmi instructions for colonoscopy explained and pamphlet given.

## 2015-11-24 ENCOUNTER — Encounter: Payer: Self-pay | Admitting: Gastroenterology

## 2015-12-05 ENCOUNTER — Other Ambulatory Visit: Payer: Self-pay | Admitting: Physician Assistant

## 2015-12-06 ENCOUNTER — Encounter: Payer: Self-pay | Admitting: Gastroenterology

## 2015-12-06 ENCOUNTER — Ambulatory Visit (AMBULATORY_SURGERY_CENTER): Payer: BC Managed Care – PPO | Admitting: Gastroenterology

## 2015-12-06 VITALS — BP 99/65 | HR 78 | Temp 98.4°F | Resp 23 | Ht 62.0 in | Wt 186.0 lb

## 2015-12-06 DIAGNOSIS — Z1212 Encounter for screening for malignant neoplasm of rectum: Secondary | ICD-10-CM | POA: Diagnosis not present

## 2015-12-06 DIAGNOSIS — Z1211 Encounter for screening for malignant neoplasm of colon: Secondary | ICD-10-CM

## 2015-12-06 HISTORY — PX: COLONOSCOPY: SHX174

## 2015-12-06 LAB — GLUCOSE, CAPILLARY
Glucose-Capillary: 142 mg/dL — ABNORMAL HIGH (ref 65–99)
Glucose-Capillary: 113 mg/dL — ABNORMAL HIGH (ref 65–99)

## 2015-12-06 MED ORDER — SODIUM CHLORIDE 0.9 % IV SOLN: 500.0000 mL | INTRAVENOUS | Status: DC

## 2015-12-06 NOTE — Progress Notes (Signed)
Report to PACU, RN, vss, BBS= Clear.  

## 2015-12-06 NOTE — Op Note (Signed)
[facility] Patient Name: Stephanie Nunez Procedure Date: 12/06/2015 9:10 AM MRN: [id number] Endoscopist: Ladene Artist , MD Age: 55 Referring MD:  Date of Birth: 03-31-1960 Gender: Female Account #: [id number] Procedure:                Colonoscopy Indications:              Screening for colorectal malignant neoplasm Medicines:                Monitored Anesthesia Care Procedure:                Pre-Anesthesia Assessment:                           - Prior to the procedure, a History and Physical                            was performed, and patient medications and                            allergies were reviewed. The patient's tolerance of                            previous anesthesia was also reviewed. The risks                            and benefits of the procedure and the sedation                            options and risks were discussed with the patient.                            All questions were answered, and informed consent                            was obtained. Prior Anticoagulants: The patient has                            taken no previous anticoagulant or antiplatelet                            agents. ASA Grade Assessment: II - A patient with                            mild systemic disease. After reviewing the risks                            and benefits, the patient was deemed in                            satisfactory condition to undergo the procedure.                           After obtaining informed consent, the colonoscope  was passed under direct vision. Throughout the                            procedure, the patient's blood pressure, pulse, and                            oxygen saturations were monitored continuously. The                            Model PCF-H190L [id number]) scope was introduced                            through the anus and advanced to the the cecum,                            identified by  appendiceal orifice and ileocecal                            valve. The ileocecal valve, appendiceal orifice,                            and rectum were photographed. The quality of the                            bowel preparation was excellent. The patient                            tolerated the procedure well. The colonoscopy was                            somewhat difficult due to a tortuous colon.                            Successful completion of the procedure was aided by                            withdrawing and reinserting the scope. Scope In: 9:14:46 AM Scope Out: 9:30:08 AM Scope Withdrawal Time: 0 hours 11 minutes 18 seconds  Total Procedure Duration: 0 hours 15 minutes 22 seconds  Findings:                 The perianal and digital rectal examinations were                            normal.                           Internal hemorrhoids were found during                            retroflexion. The hemorrhoids were small and Grade                            I (internal hemorrhoids that do not prolapse).  Scattered medium-mouthed diverticula were found in                            the ascending colon.                           The exam was otherwise without abnormality on                            direct and retroflexion views. Complications:            No immediate complications. Estimated blood loss:                            None. Estimated Blood Loss:     Estimated blood loss: none. Impression:               - Internal hemorrhoids.                           - Diverticulosis in the ascending colon.                           - The examination was otherwise normal on direct                            and retroflexion views.                           - No specimens collected. Recommendation:           - Repeat colonoscopy in 10 years for screening                            purposes.                           - Patient has a contact number  available for                            emergencies. The signs and symptoms of potential                            delayed complications were discussed with the                            patient. Return to normal activities tomorrow.                            Written discharge instructions were provided to the                            patient.                           - Resume previous diet.                           -  Continue present medications. Ladene Artist, MD 12/06/2015 9:33:11 AM This report has been signed electronically.

## 2015-12-06 NOTE — Patient Instructions (Signed)
  HANDOUTS GIVEN:DIVERTICULOSIS, HIGH FIBER AND HEMMORROIDS.  YOU HAD AN ENDOSCOPIC PROCEDURE TODAY AT [facility]:   Refer to the procedure report that was given to you for any specific questions about what was found during the examination.  If the procedure report does not answer your questions, please call your gastroenterologist to clarify.  If you requested that your care partner not be given the details of your procedure findings, then the procedure report has been included in a sealed envelope for you to review at your convenience later.  YOU SHOULD EXPECT: Some feelings of bloating in the abdomen. Passage of more gas than usual.  Walking can help get rid of the air that was put into your GI tract during the procedure and reduce the bloating. If you had a lower endoscopy (such as a colonoscopy or flexible sigmoidoscopy) you may notice spotting of blood in your stool or on the toilet paper. If you underwent a bowel prep for your procedure, you may not have a normal bowel movement for a few days.  Please Note:  You might notice some irritation and congestion in your nose or some drainage.  This is from the oxygen used during your procedure.  There is no need for concern and it should clear up in a day or so.  SYMPTOMS TO REPORT IMMEDIATELY:   Following lower endoscopy (colonoscopy or flexible sigmoidoscopy):  Excessive amounts of blood in the stool  Significant tenderness or worsening of abdominal pains  Swelling of the abdomen that is new, acute  Fever of 100F or higher   For urgent or emergent issues, a gastroenterologist can be reached at any hour by calling [phone].   DIET:  We do recommend a small meal at first, but then you may proceed to your regular diet.  Drink plenty of fluids but you should avoid alcoholic beverages for 24 hours.  ACTIVITY:  You should plan to take it easy for the rest of today and you should NOT DRIVE or use heavy machinery until  tomorrow (because of the sedation medicines used during the test).    FOLLOW UP: Our staff will call the number listed on your records the next business day following your procedure to check on you and address any questions or concerns that you may have regarding the information given to you following your procedure. If we do not reach you, we will leave a message.  However, if you are feeling well and you are not experiencing any problems, there is no need to return our call.  We will assume that you have returned to your regular daily activities without incident.  If any biopsies were taken you will be contacted by phone or by letter within the next 1-3 weeks.  Please call us at [phone] if you have not heard about the biopsies in 3 weeks.    SIGNATURES/CONFIDENTIALITY: You and/or your care partner have signed paperwork which will be entered into your electronic medical record.  These signatures attest to the fact that that the information above on your After Visit Summary has been reviewed and is understood.  Full responsibility of the confidentiality of this discharge information lies with you and/or your care-partner.

## 2015-12-09 NOTE — Telephone Encounter (Signed)
09/09/2015 last ov and labs

## 2015-12-11 ENCOUNTER — Telehealth: Payer: Self-pay

## 2015-12-11 NOTE — Telephone Encounter (Signed)
  Follow up Call-  Call back number 12/06/2015  Post procedure Call Back phone  # [phone]  Permission to leave phone message Yes  Some recent data might be hidden    Patient was called for follow up after her procedure on 12/06/2015. No answer at the number given for follow up phone call. A message was left on the answering machine.

## 2015-12-11 NOTE — Telephone Encounter (Signed)
  Follow up Call-  Call back number 12/06/2015  Post procedure Call Back phone  # [phone]  Permission to leave phone message Yes  Some recent data might be hidden    Patient was called for follow up after her procedure on 12/06/2015. No answer at the number given for follow up phone call. A message was left on the answering machine.

## 2016-01-05 ENCOUNTER — Encounter: Payer: Self-pay | Admitting: Physician Assistant

## 2016-01-05 DIAGNOSIS — H04209 Unspecified epiphora, unspecified lacrimal gland: Secondary | ICD-10-CM | POA: Insufficient documentation

## 2016-02-02 ENCOUNTER — Encounter: Payer: Self-pay | Admitting: Physician Assistant

## 2016-03-08 ENCOUNTER — Other Ambulatory Visit: Payer: Self-pay | Admitting: Physician Assistant

## 2016-03-08 DIAGNOSIS — I10 Essential (primary) hypertension: Secondary | ICD-10-CM

## 2016-03-29 LAB — HM DIABETES EYE EXAM

## 2016-09-15 ENCOUNTER — Other Ambulatory Visit: Payer: Self-pay | Admitting: Physician Assistant

## 2016-09-15 DIAGNOSIS — E785 Hyperlipidemia, unspecified: Secondary | ICD-10-CM

## 2016-09-21 ENCOUNTER — Ambulatory Visit (INDEPENDENT_AMBULATORY_CARE_PROVIDER_SITE_OTHER): Payer: BC Managed Care – PPO | Admitting: Family Medicine

## 2016-09-21 ENCOUNTER — Encounter: Payer: Self-pay | Admitting: Family Medicine

## 2016-09-21 VITALS — BP 146/91 | HR 70 | Temp 98.0°F | Resp 18 | Ht 62.6 in | Wt 183.0 lb

## 2016-09-21 DIAGNOSIS — J302 Other seasonal allergic rhinitis: Secondary | ICD-10-CM

## 2016-09-21 DIAGNOSIS — E669 Obesity, unspecified: Secondary | ICD-10-CM | POA: Diagnosis not present

## 2016-09-21 DIAGNOSIS — E7800 Pure hypercholesterolemia, unspecified: Secondary | ICD-10-CM

## 2016-09-21 DIAGNOSIS — Z0000 Encounter for general adult medical examination without abnormal findings: Secondary | ICD-10-CM

## 2016-09-21 DIAGNOSIS — D649 Anemia, unspecified: Secondary | ICD-10-CM

## 2016-09-21 DIAGNOSIS — E119 Type 2 diabetes mellitus without complications: Secondary | ICD-10-CM | POA: Diagnosis not present

## 2016-09-21 DIAGNOSIS — R208 Other disturbances of skin sensation: Secondary | ICD-10-CM | POA: Diagnosis not present

## 2016-09-21 DIAGNOSIS — Z1329 Encounter for screening for other suspected endocrine disorder: Secondary | ICD-10-CM

## 2016-09-21 DIAGNOSIS — I10 Essential (primary) hypertension: Secondary | ICD-10-CM | POA: Diagnosis not present

## 2016-09-21 DIAGNOSIS — Z6832 Body mass index (BMI) 32.0-32.9, adult: Secondary | ICD-10-CM | POA: Diagnosis not present

## 2016-09-21 DIAGNOSIS — Z23 Encounter for immunization: Secondary | ICD-10-CM

## 2016-09-21 DIAGNOSIS — E785 Hyperlipidemia, unspecified: Secondary | ICD-10-CM

## 2016-09-21 MED ORDER — AZELASTINE HCL 0.1 % NA SOLN: NASAL | 3 refills | Status: DC

## 2016-09-21 MED ORDER — FLUTICASONE PROPIONATE 50 MCG/ACT NA SUSP: NASAL | 3 refills | Status: DC

## 2016-09-21 MED ORDER — HYDROCHLOROTHIAZIDE 25 MG PO TABS: ORAL_TABLET | ORAL | 1 refills | Status: DC

## 2016-09-21 MED ORDER — NIACIN ER (ANTIHYPERLIPIDEMIC) 750 MG PO TBCR: 1500.0000 mg | EXTENDED_RELEASE_TABLET | Freq: Every day | ORAL | 1 refills | Status: DC

## 2016-09-21 MED ORDER — METFORMIN HCL 1000 MG PO TABS: 1000.0000 mg | ORAL_TABLET | Freq: Two times a day (BID) | ORAL | 1 refills | Status: DC

## 2016-09-21 NOTE — Patient Instructions (Addendum)
If tingling in legs continues - please return to discuss these symptoms further. We will start with labs first, including diabetes testing and thyroid to see if that may be a cause.   Will repeat blood counts - if still anemic, then return to discuss other cause/workup.  Recheck for diabetes at least every 6 months if controlled. See recommendations below.   Thanks for coming in today!   Diabetes Mellitus and Standards of Medical Care Managing diabetes (diabetes mellitus) can be complicated. Your diabetes treatment may be managed by a team of health care providers, including:  A diet and nutrition specialist (registered dietitian).  A nurse.  A certified diabetes educator (CDE).  A diabetes specialist (endocrinologist).  An eye doctor.  A primary care provider.  A dentist.  Your health care providers follow a schedule in order to help you get the best quality of care. The following schedule is a general guideline for your diabetes management plan. Your health care providers may also give you more specific instructions. HbA1c ( hemoglobin A1c) test This test provides information about blood sugar (glucose) control over the previous 2-3 months. It is used to check whether your diabetes management plan needs to be adjusted.  If you are meeting your treatment goals, this test is done at least 2 times a year.  If you are not meeting treatment goals or if your treatment goals have changed, this test is done 4 times a year.  Blood pressure test  This test is done at every routine medical visit. For most people, the goal is less than 130/80. Ask your health care provider what your goal blood pressure should be. Dental and eye exams  Visit your dentist two times a year.  If you have type 1 diabetes, get an eye exam 3-5 years after you are diagnosed, and then once a year after your first exam. ? If you were diagnosed with type 1 diabetes as a child, get an eye exam when you are age  47 or older and have had diabetes for 3-5 years. After the first exam, you should get an eye exam once a year.  If you have type 2 diabetes, have an eye exam as soon as you are diagnosed, and then once a year after your first exam. Foot care exam  Visual foot exams are done at every routine medical visit. The exams check for cuts, bruises, redness, blisters, sores, or other problems with the feet.  A complete foot exam is done by your health care provider once a year. This exam includes an inspection of the structure and skin of your feet, and a check of the pulses and sensation in your feet. ? Type 1 diabetes: Get your first exam 3-5 years after diagnosis. ? Type 2 diabetes: Get your first exam as soon as you are diagnosed.  Check your feet every day for cuts, bruises, redness, blisters, or sores. If you have any of these or other problems that are not healing, contact your health care provider. Kidney function test ( urine microalbumin)  This test is done once a year. ? Type 1 diabetes: Get your first test 5 years after diagnosis. ? Type 2 diabetes: Get your first test as soon as you are diagnosed.  If you have chronic kidney disease (CKD), get a serum creatinine and estimated glomerular filtration rate (eGFR) test once a year. Lipid profile (cholesterol, HDL, LDL, triglycerides)  This test should be done when you are diagnosed with diabetes, and every 5  years after the first test. If you are on medicines to lower your cholesterol, you may need to get this test done every year. ? The goal for LDL is less than 100 mg/dL (5.5 mmol/L). If you are at high risk, the goal is less than 70 mg/dL (3.9 mmol/L). ? The goal for HDL is 40 mg/dL (2.2 mmol/L) for men and 50 mg/dL(2.8 mmol/L) for women. An HDL cholesterol of 60 mg/dL (3.3 mmol/L) or higher gives some protection against heart disease. ? The goal for triglycerides is less than 150 mg/dL (8.3 mmol/L). Immunizations  The yearly flu  (influenza) vaccine is recommended for everyone 6 months or older who has diabetes.  The pneumonia (pneumococcal) vaccine is recommended for everyone 2 years or older who has diabetes. If you are 20 or older, you may get the pneumonia vaccine as a series of two separate shots.  The hepatitis B vaccine is recommended for adults shortly after they have been diagnosed with diabetes.  The Tdap (tetanus, diphtheria, and pertussis) vaccine should be given: ? According to normal childhood vaccination schedules, for children. ? Every 10 years, for adults who have diabetes.  The shingles vaccine is recommended for people who have had chicken pox and are 50 years or older. Mental and emotional health  Screening for symptoms of eating disorders, anxiety, and depression is recommended at the time of diagnosis and afterward as needed. If your screening shows that you have symptoms (you have a positive screening result), you may need further evaluation and be referred to a mental health care provider. Diabetes self-management education  Education about how to manage your diabetes is recommended at diagnosis and ongoing as needed. Treatment plan  Your treatment plan will be reviewed at every medical visit. Summary  Managing diabetes (diabetes mellitus) can be complicated. Your diabetes treatment may be managed by a team of health care providers.  Your health care providers follow a schedule in order to help you get the best quality of care.  Standards of care including having regular physical exams, blood tests, blood pressure monitoring, immunizations, screening tests, and education about how to manage your diabetes.  Your health care providers may also give you more specific instructions based on your individual health. This information is not intended to replace advice given to you by your health care provider. Make sure you discuss any questions you have with your health care provider. Document  Released: 10/28/2008 Document Revised: 09/29/2015 Document Reviewed: 09/29/2015 Elsevier Interactive Patient Education  2018 [place]  Get These Tests  Blood Pressure- Have your blood pressure checked by your healthcare provider at least once a year.  Normal blood pressure is 120/80.  Weight- Have your body mass index (BMI) calculated to screen for obesity.  BMI is a measure of body fat based on height and weight.  You can calculate your own BMI at [employer]  Cholesterol- Have your cholesterol checked every year.  Diabetes- Have your blood sugar checked every year if you have high blood pressure, high cholesterol, a family history of diabetes or if you are overweight.  Pap Test - Have a pap test every 1 to 5 years if you have been sexually active.  If you are older than 65 and recent pap tests have been normal you may not need additional pap tests.  In addition, if you have had a hysterectomy  for benign disease additional pap tests are not necessary.  Mammogram-Yearly mammograms are essential for early detection  of breast cancer  Screening for Colon Cancer- Colonoscopy starting at age 69. Screening may begin sooner depending on your family history and other health conditions.  Follow up colonoscopy as directed by your Gastroenterologist.  Screening for Osteoporosis- Screening begins at age 61 with bone density scanning, sooner if you are at higher risk for developing Osteoporosis.  Get these medicines  Calcium with Vitamin D- Your body requires 1200-1500 mg of Calcium a day and [phone] IU of Vitamin D a day.  You can only absorb 500 mg of Calcium at a time therefore Calcium must be taken in 2 or 3 separate doses throughout the day.  Hormones- Hormone therapy has been associated with increased risk for certain cancers and heart disease.  Talk to your healthcare provider about if you need relief from menopausal symptoms.  Aspirin- Ask your  healthcare provider about taking Aspirin to prevent Heart Disease and Stroke.  Get these Immuniztions  Flu shot- Every fall  Pneumonia shot- Once after the age of 42; if you are younger ask your healthcare provider if you need a pneumonia shot.  Tetanus- Every ten years.  Zostavax- Once after the age of 65 to prevent shingles.  Take these steps  Don't smoke- Your healthcare provider can help you quit. For tips on how to quit, ask your healthcare provider or go to www.smokefree.gov or call 1-800 QUIT-NOW.  Be physically active- Exercise 5 days a week for a minimum of 30 minutes.  If you are not already physically active, start slow and gradually work up to 30 minutes of moderate physical activity.  Try walking, dancing, bike riding, swimming, etc.  Eat a healthy diet- Eat a variety of healthy foods such as fruits, vegetables, whole grains, low fat milk, low fat cheeses, yogurt, lean meats, chicken, fish, eggs, dried beans, tofu, etc.  For more information go to www.thenutritionsource.org  Dental visit- Brush and floss teeth twice daily; visit your dentist twice a year.  Eye exam- Visit your Optometrist or Ophthalmologist yearly.  Drink alcohol in moderation- Limit alcohol intake to one drink or less a day.  Never drink and drive.  Depression- Your emotional health is as important as your physical health.  If you're feeling down or losing interest in things you normally enjoy, please talk to your healthcare provider.  Seat Belts- can save your life; always wear one  Smoke/Carbon Monoxide detectors- These detectors need to be installed on the appropriate level of your home.  Replace batteries at least once a year.  Violence- If anyone is threatening or hurting you, please tell your healthcare provider.  Living Will/ Health care power of attorney- Discuss with your healthcare provider and family.   IF you received an x-ray today, you will receive an invoice from [facility].  Please contact [facility] at [phone] with questions or concerns regarding your invoice.   IF you received labwork today, you will receive an invoice from [facility]. Please contact [facility] at [phone] with questions or concerns regarding your invoice.   Our billing staff will not be able to assist you with questions regarding bills from these companies.  You will be contacted with the lab results as soon as they are available. The fastest way to get your results is to activate your My Chart account. Instructions are located on the last page of this paperwork. If you have not heard from [place] regarding the results in 2 weeks, please contact this office.

## 2016-09-21 NOTE — Progress Notes (Signed)
Subjective:  By signing my name below, I, Moises Blood, attest that this documentation has been prepared under the direction and in the presence of Merri Ray, MD. Electronically Signed: Moises Blood, [place]. 09/21/2016 , 9:08 AM .  Patient was seen in Room 11 .   Patient ID: Stephanie Nunez, female    DOB: 11/26/1960, 57 y.o.   MRN: [id number] Chief Complaint  Patient presents with  . Annual Exam   HPI Stephanie Nunez is a 56 y.o. female Here for annual physical. She has history of HTN, hyperlipidemia, DM and obesity. PCP is Harrison Mons, PA-C.   DM Lab Results  Component Value Date   HGBA1C 6.4 (H) 09/09/2015   Lab Results  Component Value Date   MICROALBUR 1.0 09/09/2015   Last ophtho evaluation: Nov 2017; denies retinopathy.  Last appointment in here for DM was in Aug 2017. She takes Metformin 1052m BID; denies any new side effects. She had a normal foot exam today. She hasn't been checking her sugars at home.   Lower leg tingling- She states having some tingling in the morning from her knees to her ankles. She thought it was attributed to biking at the gym. She denies soreness. After she stretches, the tingling improves. She denies history of thyroid issues.   HTN Lab Results  Component Value Date   CREATININE 0.64 09/09/2015   She takes HCTZ 274mQD and aspirin 8165mShe hasn't been checking her BP at home.   Hyperlipidemia She is allergic to statins. She takes Niaspan 1500m36mS; notes having some flushing. Discussed Zetia in Aug 2017; appears not taking this medication.   Lab Results  Component Value Date   CHOL 237 (H) 09/09/2015   HDL 66 09/09/2015   LDLCALC 161 (H) 09/09/2015   TRIG 49 09/09/2015   CHOLHDL 3.6 09/09/2015   Lab Results  Component Value Date   ALT 11 09/09/2015   AST 17 09/09/2015   ALKPHOS 46 09/09/2015   BILITOT 0.8 09/09/2015   Anemia Lab Results  Component Value Date   WBC 4.9 09/09/2015   HGB 10.8 (L) 09/09/2015   HCT 33.3 (L) 09/09/2015   MCV 80.0 09/09/2015   PLT 521 (H) 09/09/2015   She had a colonoscopy done on Nov 22nd, 2017, done by Dr. StarFuller Planich showed internal hemorrhoids and diverticulosis; otherwise normal.   Allergic rhinitis  She had taken Flonase and Astelin nasal spray previously. She doesn't have been taking them as much as before.   Obesity Wt Readings from Last 3 Encounters:  09/21/16 183 lb (83 kg)  12/06/15 186 lb (84.4 kg)  11/22/15 186 lb (84.4 kg)   Body mass index is 32.83 kg/m.  Cancer Screening Colonoscopy: as above, done in Nov 2017 Mammogram: done in Jan 2018, which was negative Pap testing: last done Dec 2016, which was negative; due next year.   Immunizations Immunization History  Administered Date(s) Administered  . Influenza Split 10/31/2011  . Influenza,inj,Quad PF,6+ Mos 10/18/2012, 09/09/2015  . Influenza-Unspecified 11/05/2013  . Pneumococcal Polysaccharide-23 09/09/2015  . Tdap 11/13/2004, 09/09/2015  . Zoster 05/14/2012   Flu shot- she agrees to update today.   Depression Depression screen PHQ [facility] 09/21/2016 09/09/2015 11/25/2014 10/11/2014 04/12/2014  Decreased Interest 0 0 0 0 0  Down, Depressed, Hopeless 0 0 0 0 0  PHQ - 2 Score 0 0 0 0 0    Vision  Visual Acuity Screening   Right eye Left eye Both eyes  Without correction:  With correction: '20/20 20/20 20/20 '   Last ophtho visit in Nov 2017; plans to schedule appointment later this year.   Dentist She hasn't seen dentist in over a year.   Exercise She exercises 4-5 days a week at the gym; denies chest pain, chest tightness, or shortness of breath.   Patient Active Problem List   Diagnosis Date Noted  . Epiphora 01/05/2016  . HTN (hypertension) 02/01/2011  . Hyperlipemia 02/01/2011  . Obesity (BMI 30-39.9) 02/01/2011  . DM type 2 (diabetes mellitus, type 2) ([place]) 02/01/2011   Past Medical History:  Diagnosis Date  . Allergy   . Anemia   . Diabetes type 2, controlled  ([place])   . Gestational diabetes mellitus   . Hyperlipidemia    no meds  . Hypertension   . Uterine fibroid    Past Surgical History:  Procedure Laterality Date  . BREAST SURGERY     fatty tumor under left breast  . LIPOMA EXCISION     Left chest wall  . LUMBAR SPINE SURGERY     L4-5   Allergies  Allergen Reactions  . Simvastatin     myalgia  . Statins     Severe musculoskeletal pains.   Prior to Admission medications   Medication Sig Start Date End Date Taking? Authorizing Provider  aspirin 81 MG tablet Take 81 mg by mouth daily.    [provider]  azelastine (ASTELIN) 0.1 % nasal spray PLACE 2 SPRAYS IN EACH NOSTRIL TWICE DAILY AS DIRECTED 09/09/15   Weber, Damaris Hippo, PA-C  cholecalciferol (VITAMIN D) 1000 units tablet Take 2,000 Units by mouth daily.    [provider]  fluticasone (FLONASE) 50 MCG/ACT nasal spray PLACE 2 SPRAYS INTO THE NOSE DAILY 09/09/15   Gale Journey, Damaris Hippo, PA-C  hydrochlorothiazide (HYDRODIURIL) 25 MG tablet TAKE 1 TABLET(25 MG) BY MOUTH DAILY 03/08/16   Jaynee Eagles, PA-C  metFORMIN (GLUCOPHAGE) 1000 MG tablet Take 1 tablet (1,000 mg total) by mouth 2 (two) times daily with a meal. 09/09/15   Weber, Damaris Hippo, PA-C  metFORMIN (GLUCOPHAGE) 1000 MG tablet TAKE 1 TABLET BY MOUTH TWICE DAILY WITH MEALS 12/09/15   Jeffery, Chelle, PA-C  Multiple Vitamin (MULTIVITAMIN) tablet Take 1 tablet by mouth daily.    [provider]  niacin (NIASPAN) 750 MG CR tablet Take 2 tablets (1,500 mg total) by mouth at bedtime. 09/09/15   Mancel Bale, PA-C   Social History   Social History  . Marital status: Married    Spouse name: Gerald Stabs  . Number of children: 1  . Years of education: 95   Occupational History  . Control and instrumentation engineer    Social History Main Topics  . Smoking status: Never Smoker  . Smokeless tobacco: Never Used  . Alcohol use 2.4 oz/week    4 Glasses of wine per week     Comment: 4 glasses wine a week   . Drug use: No  . Sexual  activity: Yes    Partners: Male    Birth control/ protection: Condom   Other Topics Concern  . Not on file   Social History Narrative   Lives with her husband (a Software engineer) and their daughter.  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 She appears  well-developed and well-nourished.  HENT:  Head: Normocephalic and atraumatic.  Right Ear: External ear normal.  Left Ear: External ear normal.  Mouth/Throat: Oropharynx is clear and moist.  Eyes: Pupils are equal, round, and reactive to light. Conjunctivae are normal.  Neck: Normal range of motion. Neck supple. No thyromegaly present.  Cardiovascular: Normal rate, regular rhythm, normal heart sounds and intact distal pulses.   No murmur heard. Pulmonary/Chest: Effort normal and breath sounds normal. No respiratory distress. She has no wheezes.  Abdominal: Soft. Bowel sounds are normal. There is no tenderness.  Musculoskeletal: Normal range of motion. She exhibits no edema or tenderness.  Lymphadenopathy:    She has no cervical adenopathy.  Neurological: She is alert and oriented to person, place, and time.  Skin: Skin is warm and dry. No rash noted.  Psychiatric: She has a normal mood and affect. Her behavior is normal. Thought content normal.  Vitals reviewed.    Vitals:   09/21/16 0841  BP: (!) 146/91  Pulse: 70  Resp: 18  Temp: 98 F (36.7 C)  TempSrc: Oral  SpO2: 97%  Weight: 183 lb (83 kg)  Height: 5' 2.6" (1.59 m)       Assessment & Plan:   DANICA CAMARENA is a 56 y.o. female Annual physical exam  --anticipatory guidance as below in AVS, screening labs above. Health maintenance items as above in HPI discussed/recommended as applicable.   Need for prophylactic vaccination and inoculation  against influenza - Plan: Flu Vaccine QUAD 36+ mos IM  Type 2 diabetes mellitus without complication, without long-term current use of insulin ([place]) - Plan: Hemoglobin A1c, metFORMIN (GLUCOPHAGE) 1000 MG tablet  - repeat A1c, urine microalbumin.  continue metformin same dose for now. Recheck 6 months if A1c controlled.   Pure hypercholesterolemia - Plan: Comprehensive metabolic panel, Lipid panel  - Continue Niaspan, consider addition of Zetia depending on LDL. Intolerant of statins.  Essential hypertension - Plan: Comprehensive metabolic panel, hydrochlorothiazide (HYDRODIURIL) 25 MG tablet  -Mildly elevated in office, will have her check home readings and if running over 130/80 (goal with diabetic), return to discuss medication changes - would consider addition of ACE inhibitor with diabetes.  - Check labs, continue HCTZ 25 mg daily  Class 1 obesity with serious comorbidity and body mass index (BMI) of 32.0 to 32.9 in adult, unspecified obesity type  - Commended on exercise. Continue exercise, watching diet.  Dysesthesia - Plan: TSH, Hemoglobin A1c  -Improves with stretching, less likely peripheral neuropathy. Check A1c, check TSH. RTC precautions if persistent  Anemia, unspecified type - Plan: CBC  -Up-to-date on colonoscopy.  Screening for thyroid disorder - Plan: TSH  Other seasonal allergic rhinitis - Plan: azelastine (ASTELIN) 0.1 % nasal spray, fluticasone (FLONASE) 50 MCG/ACT nasal spray  -Stable with intermittent dosing of Astelin, Flonase, refilled.  Hyperlipidemia, unspecified hyperlipidemia type - Plan: niacin (NIASPAN) 750 MG CR tablet  -As above  Meds ordered this encounter  Medications  . metFORMIN (GLUCOPHAGE) 1000 MG tablet    Sig: Take 1 tablet (1,000 mg total) by mouth 2 (two) times daily with a meal.    Dispense:  90 tablet    Refill:  1  . hydrochlorothiazide (HYDRODIURIL) 25 MG tablet    Sig: TAKE 1 TABLET(25 MG) BY MOUTH DAILY    Dispense:  90 tablet     Refill:  1  . azelastine (ASTELIN) 0.1 % nasal spray    Sig: PLACE 2 SPRAYS IN EACH NOSTRIL TWICE DAILY AS DIRECTED  Dispense:  90 mL    Refill:  3  . fluticasone (FLONASE) 50 MCG/ACT nasal spray    Sig: PLACE 2 SPRAYS INTO THE NOSE DAILY    Dispense:  48 g    Refill:  3  . niacin (NIASPAN) 750 MG CR tablet    Sig: Take 2 tablets (1,500 mg total) by mouth at bedtime.    Dispense:  180 tablet    Refill:  1   Patient Instructions    If tingling in legs continues - please return to discuss these symptoms further. We will start with labs first, including diabetes testing and thyroid to see if that may be a cause.   Will repeat blood counts - if still anemic, then return to discuss other cause/workup.  Recheck for diabetes at least every 6 months if controlled. See recommendations below.   Thanks for coming in today!   Diabetes Mellitus and Standards of Medical Care Managing diabetes (diabetes mellitus) can be complicated. Your diabetes treatment may be managed by a team of health care providers, including:  A diet and nutrition specialist (registered dietitian).  A nurse.  A certified diabetes educator (CDE).  A diabetes specialist (endocrinologist).  An eye doctor.  A primary care provider.  A dentist.  Your health care providers follow a schedule in order to help you get the best quality of care. The following schedule is a general guideline for your diabetes management plan. Your health care providers may also give you more specific instructions. HbA1c ( hemoglobin A1c) test This test provides information about blood sugar (glucose) control over the previous 2-3 months. It is used to check whether your diabetes management plan needs to be adjusted.  If you are meeting your treatment goals, this test is done at least 2 times a year.  If you are not meeting treatment goals or if your treatment goals have changed, this test is done 4 times a year.  Blood pressure  test  This test is done at every routine medical visit. For most people, the goal is less than 130/80. Ask your health care provider what your goal blood pressure should be. Dental and eye exams  Visit your dentist two times a year.  If you have type 1 diabetes, get an eye exam 3-5 years after you are diagnosed, and then once a year after your first exam. ? If you were diagnosed with type 1 diabetes as a child, get an eye exam when you are age 95 or older and have had diabetes for 3-5 years. After the first exam, you should get an eye exam once a year.  If you have type 2 diabetes, have an eye exam as soon as you are diagnosed, and then once a year after your first exam. Foot care exam  Visual foot exams are done at every routine medical visit. The exams check for cuts, bruises, redness, blisters, sores, or other problems with the feet.  A complete foot exam is done by your health care provider once a year. This exam includes an inspection of the structure and skin of your feet, and a check of the pulses and sensation in your feet. ? Type 1 diabetes: Get your first exam 3-5 years after diagnosis. ? Type 2 diabetes: Get your first exam as soon as you are diagnosed.  Check your feet every day for cuts, bruises, redness, blisters, or sores. If you have any of these or other problems that are not healing, contact your health care  provider. Kidney function test ( urine microalbumin)  This test is done once a year. ? Type 1 diabetes: Get your first test 5 years after diagnosis. ? Type 2 diabetes: Get your first test as soon as you are diagnosed.  If you have chronic kidney disease (CKD), get a serum creatinine and estimated glomerular filtration rate (eGFR) test once a year. Lipid profile (cholesterol, HDL, LDL, triglycerides)  This test should be done when you are diagnosed with diabetes, and every 5 years after the first test. If you are on medicines to lower your cholesterol, you may need  to get this test done every year. ? The goal for LDL is less than 100 mg/dL (5.5 mmol/L). If you are at high risk, the goal is less than 70 mg/dL (3.9 mmol/L). ? The goal for HDL is 40 mg/dL (2.2 mmol/L) for men and 50 mg/dL(2.8 mmol/L) for women. An HDL cholesterol of 60 mg/dL (3.3 mmol/L) or higher gives some protection against heart disease. ? The goal for triglycerides is less than 150 mg/dL (8.3 mmol/L). Immunizations  The yearly flu (influenza) vaccine is recommended for everyone 6 months or older who has diabetes.  The pneumonia (pneumococcal) vaccine is recommended for everyone 2 years or older who has diabetes. If you are 73 or older, you may get the pneumonia vaccine as a series of two separate shots.  The hepatitis B vaccine is recommended for adults shortly after they have been diagnosed with diabetes.  The Tdap (tetanus, diphtheria, and pertussis) vaccine should be given: ? According to normal childhood vaccination schedules, for children. ? Every 10 years, for adults who have diabetes.  The shingles vaccine is recommended for people who have had chicken pox and are 50 years or older. Mental and emotional health  Screening for symptoms of eating disorders, anxiety, and depression is recommended at the time of diagnosis and afterward as needed. If your screening shows that you have symptoms (you have a positive screening result), you may need further evaluation and be referred to a mental health care provider. Diabetes self-management education  Education about how to manage your diabetes is recommended at diagnosis and ongoing as needed. Treatment plan  Your treatment plan will be reviewed at every medical visit. Summary  Managing diabetes (diabetes mellitus) can be complicated. Your diabetes treatment may be managed by a team of health care providers.  Your health care providers follow a schedule in order to help you get the best quality of care.  Standards of care  including having regular physical exams, blood tests, blood pressure monitoring, immunizations, screening tests, and education about how to manage your diabetes.  Your health care providers may also give you more specific instructions based on your individual health. This information is not intended to replace advice given to you by your health care provider. Make sure you discuss any questions you have with your health care provider. Document Released: 10/28/2008 Document Revised: 09/29/2015 Document Reviewed: 09/29/2015 Elsevier Interactive Patient Education  2018 [place] [employer]  Get These Tests  Blood Pressure- Have your blood pressure checked by your healthcare provider at least once a year.  Normal blood pressure is 120/80.  Weight- Have your body mass index (BMI) calculated to screen for obesity.  BMI is a measure of body fat based on height and weight.  You can calculate your own BMI at [employer].it  Cholesterol- Have your cholesterol checked every year.  Diabetes- Have your blood sugar checked every year if  you have high blood pressure, high cholesterol, a family history of diabetes or if you are overweight.  Pap Test - Have a pap test every 1 to 5 years if you have been sexually active.  If you are older than 65 and recent pap tests have been normal you may not need additional pap tests.  In addition, if you have had a hysterectomy  for benign disease additional pap tests are not necessary.  Mammogram-Yearly mammograms are essential for early detection of breast cancer  Screening for Colon Cancer- Colonoscopy starting at age 1. Screening may begin sooner depending on your family history and other health conditions.  Follow up colonoscopy as directed by your Gastroenterologist.  Screening for Osteoporosis- Screening begins at age 75 with bone density scanning, sooner if you are at higher risk for developing Osteoporosis.  Get these  medicines  Calcium with Vitamin D- Your body requires 1200-1500 mg of Calcium a day and [phone] IU of Vitamin D a day.  You can only absorb 500 mg of Calcium at a time therefore Calcium must be taken in 2 or 3 separate doses throughout the day.  Hormones- Hormone therapy has been associated with increased risk for certain cancers and heart disease.  Talk to your healthcare provider about if you need relief from menopausal symptoms.  Aspirin- Ask your healthcare provider about taking Aspirin to prevent Heart Disease and Stroke.  Get these Immuniztions  Flu shot- Every fall  Pneumonia shot- Once after the age of 37; if you are younger ask your healthcare provider if you need a pneumonia shot.  Tetanus- Every ten years.  Zostavax- Once after the age of 81 to prevent shingles.  Take these steps  Don't smoke- Your healthcare provider can help you quit. For tips on how to quit, ask your healthcare provider or go to www.smokefree.gov or call 1-800 QUIT-NOW.  Be physically active- Exercise 5 days a week for a minimum of 30 minutes.  If you are not already physically active, start slow and gradually work up to 30 minutes of moderate physical activity.  Try walking, dancing, bike riding, swimming, etc.  Eat a healthy diet- Eat a variety of healthy foods such as fruits, vegetables, whole grains, low fat milk, low fat cheeses, yogurt, lean meats, chicken, fish, eggs, dried beans, tofu, etc.  For more information go to www.thenutritionsource.org  Dental visit- Brush and floss teeth twice daily; visit your dentist twice a year.  Eye exam- Visit your Optometrist or Ophthalmologist yearly.  Drink alcohol in moderation- Limit alcohol intake to one drink or less a day.  Never drink and drive.  Depression- Your emotional health is as important as your physical health.  If you're feeling down or losing interest in things you normally enjoy, please talk to your healthcare provider.  Seat Belts- can  save your life; always wear one  Smoke/Carbon Monoxide detectors- These detectors need to be installed on the appropriate level of your home.  Replace batteries at least once a year.  Violence- If anyone is threatening or hurting you, please tell your healthcare provider.  Living Will/ Health care power of attorney- Discuss with your healthcare provider and family.   IF you received an x-ray today, you will receive an invoice from [facility]. Please contact [facility] at [phone] with questions or concerns regarding your invoice.   IF you received labwork today, you will receive an invoice from [facility]. Please contact [facility] at [phone] with questions or concerns regarding your invoice.  Our billing staff will not be able to assist you with questions regarding bills from these companies.  You will be contacted with the lab results as soon as they are available. The fastest way to get your results is to activate your My Chart account. Instructions are located on the last page of this paperwork. If you have not heard from [place] regarding the results in 2 weeks, please contact this office.       I personally performed the services described in this documentation, which was scribed in my presence. The recorded information has been reviewed and considered for accuracy and completeness, addended by me as needed, and agree with information above.  Signed,   Merri Ray, MD [facility].  09/21/16 9:44 AM

## 2016-09-21 NOTE — Progress Notes (Signed)
   Subjective:    Patient ID: Stephanie Nunez, female    DOB: November 16, 1960, 56 y.o.   MRN: [id number]  HPI    Review of Systems  HENT: Positive for sinus pressure.   Allergic/Immunologic: Positive for environmental allergies.  Neurological: Positive for numbness (in the leg in the morningd ).       Objective:   Physical Exam        Assessment & Plan:

## 2016-09-22 LAB — LIPID PANEL
CHOL/HDL RATIO: 4.2 ratio (ref 0.0–4.4)
Cholesterol, Total: 249 mg/dL — ABNORMAL HIGH (ref 100–199)
HDL: 59 mg/dL (ref >39)
LDL CALC: 174 mg/dL — AB (ref 0–99)
TRIGLYCERIDES: 79 mg/dL (ref 0–149)
VLDL CHOLESTEROL CAL: 16 mg/dL (ref 5–40)

## 2016-09-22 LAB — COMPREHENSIVE METABOLIC PANEL
ALBUMIN: 4.6 g/dL (ref 3.5–5.5)
ALT: 17 IU/L (ref 0–32)
AST: 26 IU/L (ref 0–40)
Albumin/Globulin Ratio: 1.6 (ref 1.2–2.2)
Alkaline Phosphatase: 63 IU/L (ref 39–117)
BUN / CREAT RATIO: 14 (ref 9–23)
BUN: 9 mg/dL (ref 6–24)
Bilirubin Total: 0.7 mg/dL (ref 0.0–1.2)
CALCIUM: 9.6 mg/dL (ref 8.7–10.2)
CO2: 26 mmol/L (ref 20–29)
Chloride: 94 mmol/L — ABNORMAL LOW (ref 96–106)
Creatinine, Ser: 0.63 mg/dL (ref 0.57–1.00)
GFR calc Af Amer: 117 mL/min/{1.73_m2} (ref >59)
GFR, EST NON AFRICAN AMERICAN: 101 mL/min/{1.73_m2} (ref >59)
GLOBULIN, TOTAL: 2.8 g/dL (ref 1.5–4.5)
Glucose: 119 mg/dL — ABNORMAL HIGH (ref 65–99)
Potassium: 4.1 mmol/L (ref 3.5–5.2)
SODIUM: 140 mmol/L (ref 134–144)
Total Protein: 7.4 g/dL (ref 6.0–8.5)

## 2016-09-22 LAB — CBC
HEMATOCRIT: 40.2 % (ref 34.0–46.6)
HEMOGLOBIN: 13.8 g/dL (ref 11.1–15.9)
MCH: 31.9 pg (ref 26.6–33.0)
MCHC: 34.3 g/dL (ref 31.5–35.7)
MCV: 93 fL (ref 79–97)
PLATELETS: 376 10*3/uL (ref 150–379)
RBC: 4.32 x10E6/uL (ref 3.77–5.28)
RDW: 13.1 % (ref 12.3–15.4)
WBC: 3.8 10*3/uL (ref 3.4–10.8)

## 2016-09-22 LAB — HEMOGLOBIN A1C
Est. average glucose Bld gHb Est-mCnc: 151 mg/dL
Hgb A1c MFr Bld: 6.9 % — ABNORMAL HIGH (ref 4.8–5.6)

## 2016-09-22 LAB — TSH: TSH: 0.672 u[IU]/mL (ref 0.450–4.500)

## 2016-10-01 ENCOUNTER — Telehealth: Payer: Self-pay | Admitting: Family Medicine

## 2016-10-01 MED ORDER — EZETIMIBE 10 MG PO TABS: 10.0000 mg | ORAL_TABLET | Freq: Every day | ORAL | 1 refills | Status: DC

## 2016-10-01 NOTE — Telephone Encounter (Signed)
Can you please place order if appropriate ?

## 2016-10-01 NOTE — Telephone Encounter (Signed)
See lab notes. Initially Zetia was sent to [facility] on [address]. I will change that in place order to [facility] on [address].

## 2016-10-01 NOTE — Telephone Encounter (Signed)
Pt said at her last appt Dr. Carlota Raspberry told her to let him know if she wanted him to call in a prescription for Zetia. She said she would like this. Pt uses [facility] on [address] in [place]. Pt can be contacted at [phone].

## 2016-10-05 ENCOUNTER — Other Ambulatory Visit: Payer: Self-pay | Admitting: Urgent Care

## 2016-10-05 DIAGNOSIS — I10 Essential (primary) hypertension: Secondary | ICD-10-CM

## 2016-11-12 ENCOUNTER — Other Ambulatory Visit: Payer: Self-pay | Admitting: Physician Assistant

## 2016-12-31 ENCOUNTER — Ambulatory Visit: Payer: Self-pay | Admitting: *Deleted

## 2016-12-31 NOTE — Telephone Encounter (Signed)
Pt has complaints of a tingling sensation that goes down to the fingers along with shoulder pain that comes and goes, for the past week.Pt states she feels a bubbling sensation under armpit. Pt states she has been ibuprofen which has given her some relief.  Pt states she does experience some relief in shoulder pain when she presses down between the middle and pointer finger. Appt scheduled for 12/19 at 1:40pm.  Reason for Disposition . Numbness (i.e., loss of sensation) in hand or fingers  Answer Assessment - Initial Assessment Questions 1. ONSET: "When did the pain start?"     Started about a week ago 2. LOCATION: "Where is the pain located?"     Left shoulder 3. PAIN: "How bad is the pain?" (Scale 1-10; or mild, moderate, severe)   - MILD (1-3): doesn't interfere with normal activities   - MODERATE (4-7): interferes with normal activities (e.g., work or school) or awakens from sleep   - SEVERE (8-10): excruciating pain, unable to do any normal activities, unable to move arm at all due to pain     mild 4. WORK OR EXERCISE: "Has there been any recent work or exercise that involved this part of the body?"     Goes to gym, mostly upper body stuff with weight lifting 5. CAUSE: "What do you think is causing the shoulder pain?"     unsure 6. OTHER SYMPTOMS: "Do you have any other symptoms?" (e.g., neck pain, swelling, rash, fever, numbness, weakness)     No 7. PREGNANCY: "Is there any chance you are pregnant?" "When was your last menstrual period?"     No  Protocols used: SHOULDER PAIN-A-AH

## 2017-01-01 ENCOUNTER — Other Ambulatory Visit: Payer: Self-pay

## 2017-01-01 ENCOUNTER — Encounter: Payer: Self-pay | Admitting: Physician Assistant

## 2017-01-01 ENCOUNTER — Ambulatory Visit (INDEPENDENT_AMBULATORY_CARE_PROVIDER_SITE_OTHER): Payer: BC Managed Care – PPO

## 2017-01-01 ENCOUNTER — Ambulatory Visit: Payer: BC Managed Care – PPO | Admitting: Physician Assistant

## 2017-01-01 VITALS — BP 132/88 | HR 90 | Temp 98.6°F | Resp 18 | Ht 62.6 in | Wt 188.8 lb

## 2017-01-01 DIAGNOSIS — M25512 Pain in left shoulder: Secondary | ICD-10-CM | POA: Diagnosis not present

## 2017-01-01 NOTE — Progress Notes (Signed)
Patient ID: Stephanie Nunez, female    DOB: [birth year], 56 y.o.   MRN: [id number]  PCP: Harrison Mons, PA-C  Chief Complaint  Patient presents with  . Shoulder Pain    Left chest/arm/burning pain. Bubbling sensation in left breast. x3 weeks. Heat helps. 6/10 pain.    Subjective:   Presents for evaluation of left shoulder pain.  Pain present in the left shoulder for approximately 3 weeks.  No specific trauma or injury.  She is exercising regularly, including upper body work, x 1 month.  No improvement since stopping the upper body work about 1 weeks ago.  No pain with exercise.  The pain extends down into the upper arm and left upper chest.  She describes it as a burning pain.  She also experiences a "bubbling" sensation in the left breast.  Application of heat, hot shower helps.  Pain is rated a 6 out of 10. Initially it was intermittent, but is now constant. Ibuprofen helps some. Heat application helped. No previous symptoms like this.  Annual Exam performed 09/21/2016 by one of my colleagues. A1C was 6.9%, CBC and TSH were normal, LDL was 174, HDL 59, TG 79. Renal and hepatic function tests were normal.  I last saw her 02/11/2013.  No shortness of breath.  No chest tightness or pressure. No reduced exercise tolerance. No pain radiating into the neck or jaw. No headache. No fever or chills.  Was prescribed Zetia but stopped taking it after only 1 month due to body aches. Resumed Niaspan about 2 weeks ago as she was unable to tolerate the Zetia..    Review of Systems Constitutional: Positive for activity change. Negative for appetite change, chills, diaphoresis, fatigue, fever and unexpected weight change.  HENT: Negative for rhinorrhea and sore throat.   Respiratory: Negative for cough and shortness of breath.   Cardiovascular: Negative for chest pain and palpitations.  Gastrointestinal: Negative for abdominal pain, constipation, nausea and vomiting.  Genitourinary:  Negative for dysuria, frequency, hematuria and urgency.  Musculoskeletal: Positive for arthralgias (left shoulder pain). Negative for back pain and neck pain.  Skin: Negative for rash.  Neurological: Negative for light-headedness and headaches.  Psychiatric/Behavioral: Negative.        Patient Active Problem List   Diagnosis Date Noted  . Epiphora 01/05/2016  . HTN (hypertension) 02/01/2011  . Hyperlipemia 02/01/2011  . Obesity (BMI 30-39.9) 02/01/2011  . DM type 2 (diabetes mellitus, type 2) ([place]) 02/01/2011     Prior to Admission medications   Medication Sig Start Date End Date Taking? Authorizing Provider  aspirin 81 MG tablet Take 81 mg by mouth daily.   Yes [provider]  azelastine (ASTELIN) 0.1 % nasal spray PLACE 2 SPRAYS IN EACH NOSTRIL TWICE DAILY AS DIRECTED 09/21/16  Yes [name], MD  cholecalciferol (VITAMIN D) 1000 units tablet Take 2,000 Units by mouth daily.   Yes [provider]  fluticasone (FLONASE) 50 MCG/ACT nasal spray PLACE 2 SPRAYS INTO THE NOSE DAILY 09/21/16  Yes [name], MD  hydrochlorothiazide (HYDRODIURIL) 25 MG tablet TAKE 1 TABLET(25 MG) BY MOUTH DAILY 09/21/16  Yes [name], MD  metFORMIN (GLUCOPHAGE) 1000 MG tablet Take 1 tablet (1,000 mg total) by mouth 2 (two) times daily with a meal. 09/21/16  Yes [name], MD  Multiple Vitamin (MULTIVITAMIN) tablet Take 1 tablet by mouth daily.   Yes [provider]  niacin (NIASPAN) 750 MG CR tablet Take 2 tablets (1,500 mg  total) by mouth at bedtime. 09/21/16  Yes [name], MD     Allergies  Allergen Reactions  . Simvastatin     myalgia  . Statins     Severe musculoskeletal pains.       Objective:  Physical Exam  Constitutional: She is oriented to person, place, and time. She appears well-developed and well-nourished. She is active and cooperative. No distress.  BP 132/88   Pulse 90   Temp 98.6 F (37 C) (Oral)   Resp 18   Ht 5'  2.6" (1.59 m)   Wt 188 lb 12.8 oz (85.6 kg)   SpO2 97%   BMI 33.87 kg/m   HENT:  Head: Normocephalic and atraumatic.  Right Ear: Hearing normal.  Left Ear: Hearing normal.  Eyes: Conjunctivae are normal. No scleral icterus.  Neck: Normal range of motion. Neck supple. No thyromegaly present.  Cardiovascular: Normal rate, regular rhythm and normal heart sounds.  Pulses:      Radial pulses are 2+ on the right side, and 2+ on the left side.  Pulmonary/Chest: Effort normal and breath sounds normal.  Musculoskeletal:       Left shoulder: She exhibits tenderness (of the trapezius and scapular region, biceps) and pain. She exhibits normal range of motion, no bony tenderness, no swelling, no effusion, no crepitus, no deformity, no laceration, no spasm, normal pulse and normal strength.       Left elbow: Normal.       Left wrist: Normal.       Left upper arm: She exhibits tenderness. She exhibits no bony tenderness, no swelling, no edema, no deformity and no laceration.       Left forearm: Normal.       Left hand: Normal.  Lymphadenopathy:       Head (right side): No tonsillar, no preauricular, no posterior auricular and no occipital adenopathy present.       Head (left side): No tonsillar, no preauricular, no posterior auricular and no occipital adenopathy present.    She has no cervical adenopathy.       Right: No supraclavicular adenopathy present.       Left: No supraclavicular adenopathy present.  Neurological: She is alert and oriented to person, place, and time. No sensory deficit.  Skin: Skin is warm, dry and intact. No rash noted. No cyanosis or erythema. Nails show no clubbing.  Psychiatric: She has a normal mood and affect. Her speech is normal and behavior is normal.    EKG reviewed with Dr. Tamala Julian. NSR. Rate 87. PR 144. QT 354. Unchanged from tracing on 12/08/2013.    Assessment & Plan:   1. Left shoulder pain, unspecified chronicity Reassuring EKG. Doubt cardiac etiology.  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 Await radiographs. May continue heat application and ibuprofen. - EKG 12-Lead - DG Cervical Spine Complete; Future    Return in about 3 months (around 04/01/2017) for re-evalaution of diabetes, cholesterol.   Fara Chute, PA-C [facility]

## 2017-01-01 NOTE — Patient Instructions (Signed)
     IF you received an x-ray today, you will receive an invoice from [facility]. Please contact [facility] at [phone] with questions or concerns regarding your invoice.   IF you received labwork today, you will receive an invoice from [facility]. Please contact [facility] at [phone] with questions or concerns regarding your invoice.   Our billing staff will not be able to assist you with questions regarding bills from these companies.  You will be contacted with the lab results as soon as they are available. The fastest way to get your results is to activate your My Chart account. Instructions are located on the last page of this paperwork. If you have not heard from us regarding the results in 2 weeks, please contact this office.     

## 2017-01-01 NOTE — Progress Notes (Signed)
Chief Complaint  Patient presents with  . Shoulder Pain    Left chest/arm/burning pain. Bubbling sensation in left breast. x3 weeks. Heat helps. 6/10 pain.   Subjective:    Patient ID: Stephanie Nunez, female    DOB: 1960/10/16, 56 y.o.   MRN: [id number]  HPI Patient presents for evaluation of shoulder pain.   She states that about three weeks ago she felt a "gas bubble" in the left shoulder blade area which the moved to her shoulder. Then she started having numbness going down her arm from her armpit to her fingertips. She has also been having pain in the shoulder, 6/10 at its worst. Pain is dull and was intermittent, but is now constant. She hasn't noticed anything making it worse. She has been taking Ibuprofen twice a day which has been helping with the pain but hasn't made it go away completely. She slept with a heating pad last night and that helped.She has never had these symptoms before. She denies injuries to her left shoulder, back and neck.   Patient states that about a month ago, she started doing some weight lifting at the gymn. She was lifting 10-30 lbs (using machines, not free weights). She was doing chest presses, shoulder presses, triceps, biceps and using "another machine that works my upper back". She stopped lifting weights at the gymn for a week now and has been. She goes to the gymn 4-5 days per week and works out for about an 1 hour. She does not remember noticing any strain or pain when lifting the weights.  She is taking all her medications except for Zetia which she stopped taking 11/14/16 because it made her body ache.   Review of Systems  Constitutional: Positive for activity change. Negative for appetite change, chills, diaphoresis, fatigue, fever and unexpected weight change.  HENT: Negative for rhinorrhea and sore throat.   Respiratory: Negative for cough and shortness of breath.   Cardiovascular: Negative for chest pain and palpitations.  Gastrointestinal:  Negative for abdominal pain, constipation, nausea and vomiting.  Genitourinary: Negative for dysuria, frequency, hematuria and urgency.  Musculoskeletal: Positive for arthralgias (left shoulder pain). Negative for back pain and neck pain.  Skin: Negative for rash.  Neurological: Negative for light-headedness and headaches.  Psychiatric/Behavioral: Negative.    Patient Active Problem List   Diagnosis Date Noted  . Epiphora 01/05/2016  . HTN (hypertension) 02/01/2011  . Hyperlipemia 02/01/2011  . Obesity (BMI 30-39.9) 02/01/2011  . DM type 2 (diabetes mellitus, type 2) ([place]) 02/01/2011      Current Meds  Medication Sig  . aspirin 81 MG tablet Take 81 mg by mouth daily.  Marland Kitchen azelastine (ASTELIN) 0.1 % nasal spray PLACE 2 SPRAYS IN EACH NOSTRIL TWICE DAILY AS DIRECTED  . cholecalciferol (VITAMIN D) 1000 units tablet Take 2,000 Units by mouth daily.  . fluticasone (FLONASE) 50 MCG/ACT nasal spray PLACE 2 SPRAYS INTO THE NOSE DAILY  . hydrochlorothiazide (HYDRODIURIL) 25 MG tablet TAKE 1 TABLET(25 MG) BY MOUTH DAILY  . metFORMIN (GLUCOPHAGE) 1000 MG tablet Take 1 tablet (1,000 mg total) by mouth 2 (two) times daily with a meal.  . Multiple Vitamin (MULTIVITAMIN) tablet Take 1 tablet by mouth daily.  . niacin (NIASPAN) 750 MG CR tablet Take 2 tablets (1,500 mg total) by mouth at bedtime.   Current Facility-Administered Medications for the 01/01/17 encounter (Office Visit) with Harrison Mons, PA-C  Medication  . 0.9 %  sodium chloride infusion   Allergies  Allergen Reactions  .  Simvastatin     myalgia  . Statins     Severe musculoskeletal pains.   Past Medical History:  Diagnosis Date  . Allergy   . Anemia   . Diabetes type 2, controlled ([place])   . Gestational diabetes mellitus   . Hyperlipidemia    no meds  . Hypertension   . Uterine fibroid    Social History   Socioeconomic History  . Marital status: Married    Spouse name: Gerald Stabs  . Number of children: 1  . Years of  education: 22  . Highest education level: Not on file  Social Needs  . Financial resource strain: Not on file  . Food insecurity - worry: Not on file  . Food insecurity - inability: Not on file  . Transportation needs - medical: Not on file  . Transportation needs - non-medical: Not on file  Occupational History  . Occupation: Control and instrumentation engineer  Tobacco Use  . Smoking status: Never Smoker  . Smokeless tobacco: Never Used  Substance and Sexual Activity  . Alcohol use: Yes    Alcohol/week: 2.4 oz    Types: 4 Glasses of wine per week    Comment: 4 glasses wine a week   . Drug use: No  . Sexual activity: Yes    Partners: Male    Birth control/protection: Condom  Other Topics Concern  . Not on file  Social History Narrative   Lives with her husband (a Software engineer) and their daughter.  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 Cancer Father        pancreatic  . Pancreatic cancer Father   . Diabetes Sister   . Hypertension Sister   . Hypertension Sister   . Hypertension Brother   . Stroke Maternal Grandmother   . Stroke Paternal Grandmother   . Colon cancer Neg Hx   . Colon polyps Neg Hx   . Esophageal cancer Neg Hx   . Rectal cancer Neg Hx   . Stomach cancer Neg Hx     Objective:   Physical Exam  Constitutional: She is oriented to person, place, and time. She appears well-developed and well-nourished. No distress.  HENT:  Head: Normocephalic and atraumatic.  Neck: Neck supple.  Cardiovascular: Normal rate, regular rhythm, normal heart sounds and intact distal pulses.  Pulmonary/Chest: Effort normal and breath sounds normal.  Musculoskeletal: Normal range of motion. She exhibits tenderness. She exhibits no edema.  Patient exhibits tenderness to palpation of cervical spine, left shoulder and left pectoralis region. She also has decreased sensation to light touch on upper left arm.    Lymphadenopathy:    She has no cervical adenopathy.  Neurological: She is alert and oriented to person, place, and time. She has normal reflexes.  Skin: Skin is warm.  Psychiatric: She has a normal mood and affect.      Assessment & Plan:  1. Left shoulder pain, unspecified chronicity EKG was normal: NSR, rate 87, PR 144, QT 354. Pain is likely coming from her neck. Continue using heat pack and Ibuprofen as needed. Next steps will be based on x-ray results.   - EKG 12-Lead - DG Cervical Spine Complete;   Follow up in 3 months for re-evaluation of diabetes and cholesterol.   [place], [place]

## 2017-01-02 ENCOUNTER — Other Ambulatory Visit: Payer: Self-pay | Admitting: Physician Assistant

## 2017-01-02 MED ORDER — MELOXICAM 15 MG PO TABS: 15.0000 mg | ORAL_TABLET | Freq: Every day | ORAL | 1 refills | Status: DC

## 2017-01-02 MED ORDER — CYCLOBENZAPRINE HCL 10 MG PO TABS: 5.0000 mg | ORAL_TABLET | Freq: Every evening | ORAL | 0 refills | Status: DC | PRN

## 2017-01-02 NOTE — Progress Notes (Unsigned)
Patient notified via My Chart.  Meds ordered this encounter  Medications  . meloxicam (MOBIC) 15 MG tablet    Sig: Take 1 tablet (15 mg total) by mouth daily.    Dispense:  30 tablet    Refill:  1    Order Specific Question:   Supervising Provider    Answer:   SHAW, EVA N [[id number]]  . cyclobenzaprine (FLEXERIL) 10 MG tablet    Sig: Take 0.5-1 tablets (5-10 mg total) by mouth at bedtime as needed for muscle spasms.    Dispense:  20 tablet    Refill:  0    Order Specific Question:   Supervising Provider    Answer:   Brigitte Pulse, EVA N [[id number]]

## 2017-02-03 ENCOUNTER — Other Ambulatory Visit: Payer: Self-pay | Admitting: Physician Assistant

## 2017-02-04 NOTE — Telephone Encounter (Signed)
Medication refill. Last office visit 01/01/17. Thanks.

## 2017-02-04 NOTE — Telephone Encounter (Signed)
Please advise 

## 2017-02-19 ENCOUNTER — Telehealth: Payer: Self-pay | Admitting: Physician Assistant

## 2017-02-19 NOTE — Telephone Encounter (Signed)
Called to try and reschedule appt with Stephanie Nunez on 04/04/17. When pt calls in, please reschedule them for any other day.  Thanks!

## 2017-03-03 ENCOUNTER — Other Ambulatory Visit: Payer: Self-pay | Admitting: Physician Assistant

## 2017-03-03 NOTE — Telephone Encounter (Signed)
Refill of Mobic  LOV 01/01/17  LRF 01/02/17  #30  1 refill       [facility], [place] - [address] AT [place] OF [address] & [facility]

## 2017-03-31 ENCOUNTER — Other Ambulatory Visit: Payer: Self-pay | Admitting: Physician Assistant

## 2017-04-01 ENCOUNTER — Ambulatory Visit: Payer: BC Managed Care – PPO | Admitting: Physician Assistant

## 2017-04-03 ENCOUNTER — Other Ambulatory Visit: Payer: Self-pay | Admitting: Family Medicine

## 2017-04-03 DIAGNOSIS — I10 Essential (primary) hypertension: Secondary | ICD-10-CM

## 2017-04-04 ENCOUNTER — Ambulatory Visit: Payer: BC Managed Care – PPO | Admitting: Physician Assistant

## 2017-04-18 IMAGING — CR DG ABDOMEN ACUTE W/ 1V CHEST
3 series · 3 of 3 positions shown · non-contrast
Comparison: None.

CLINICAL DATA: Abdominal pain.

EXAM:
DG ABDOMEN ACUTE W/ 1V CHEST

[PA]
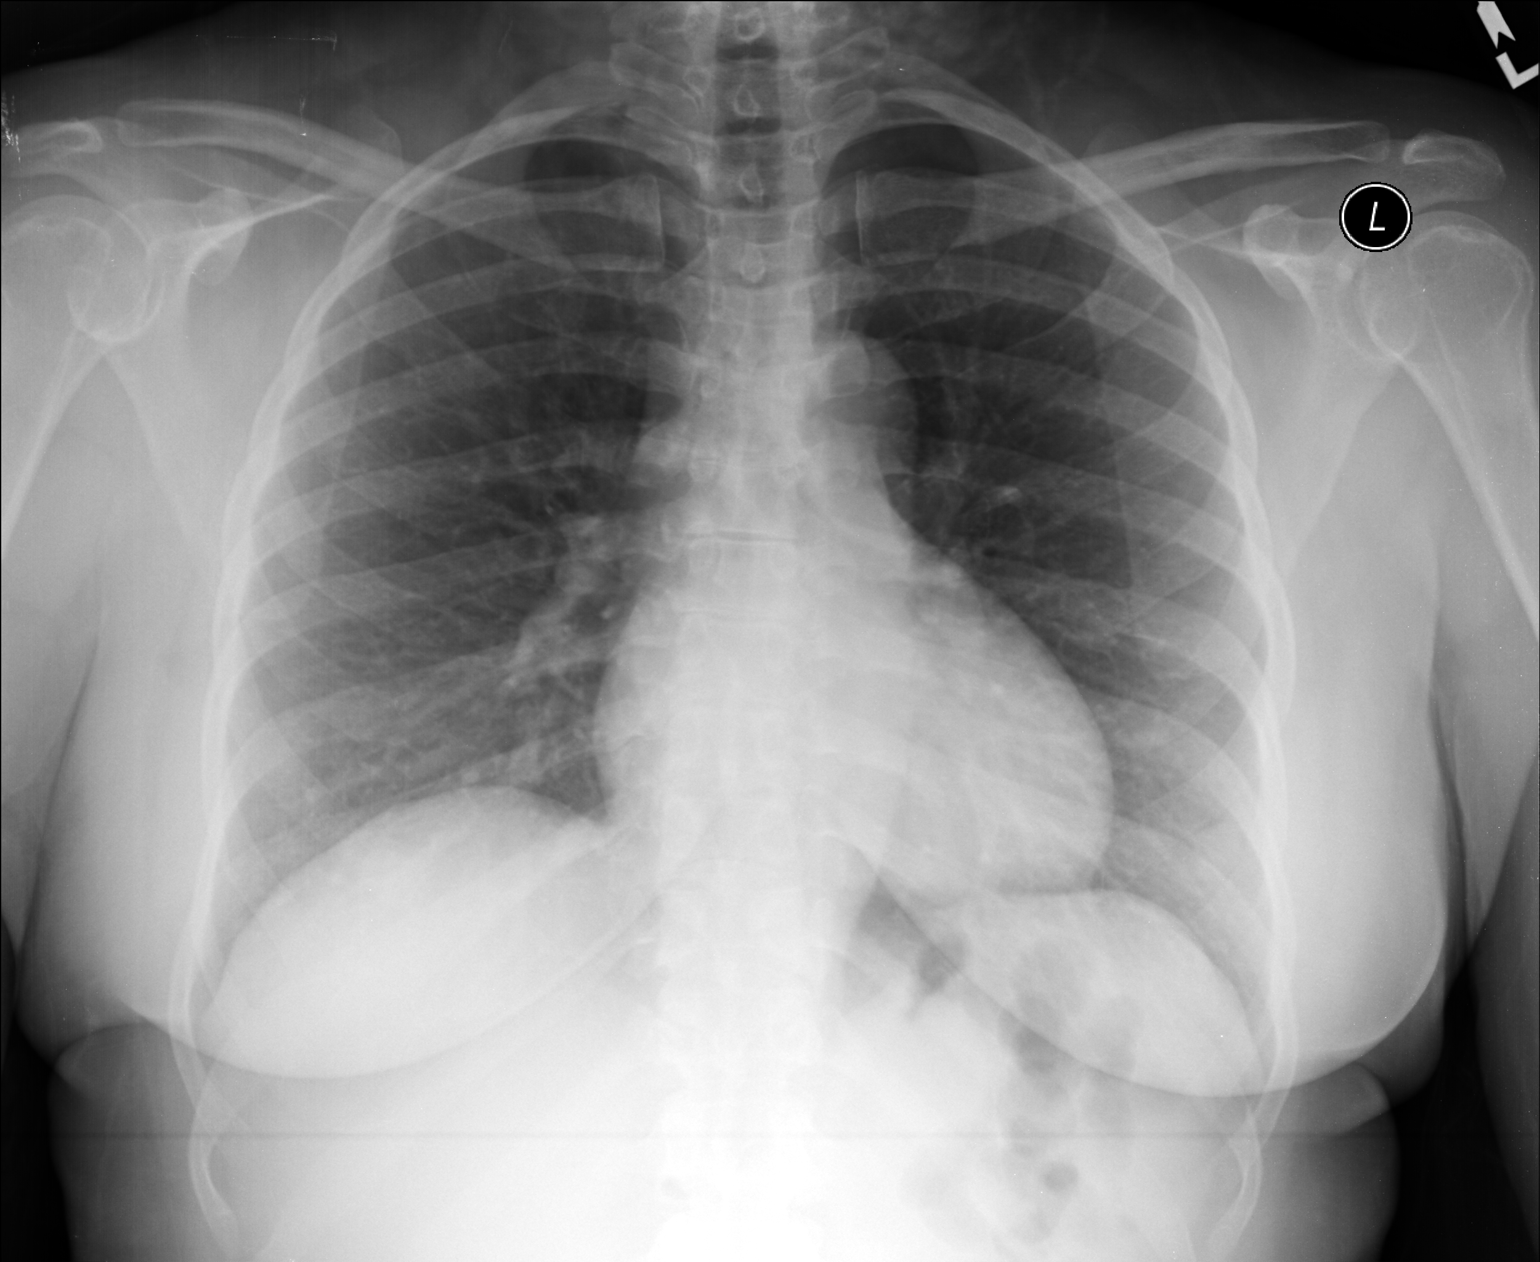

[AP (1 of 2)]
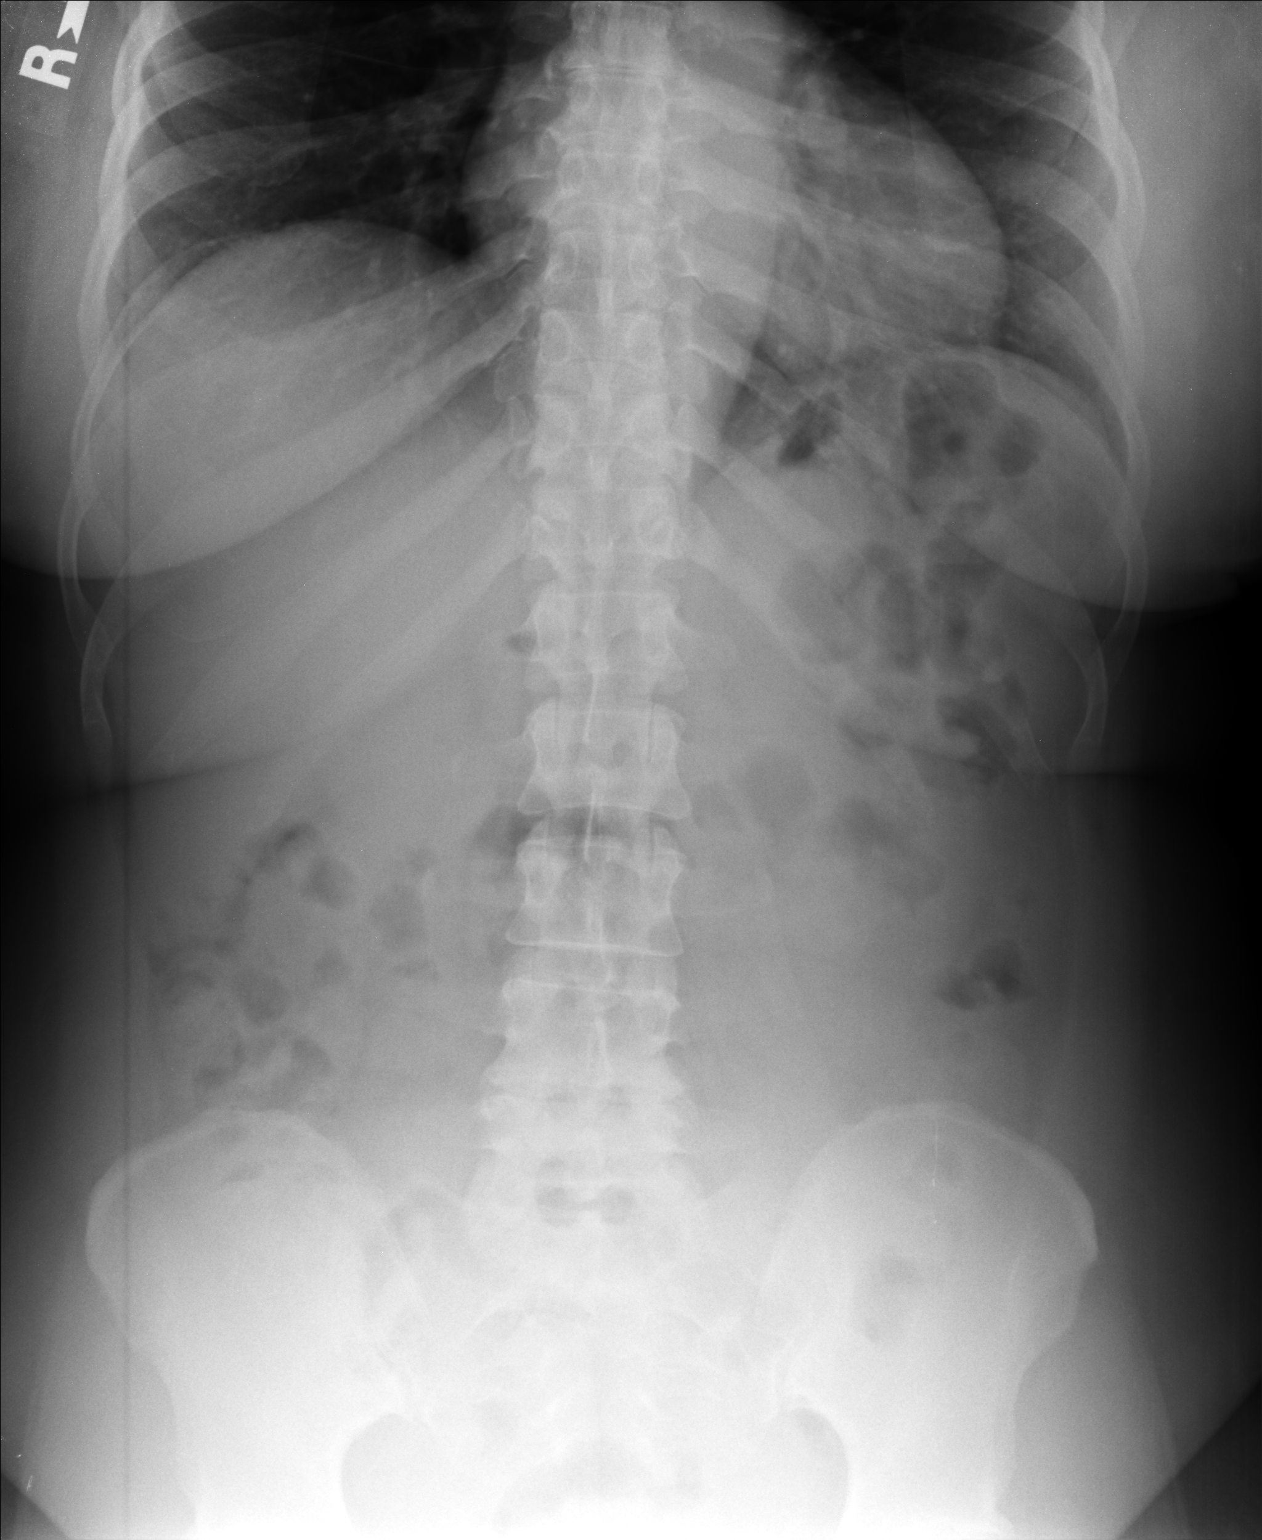

[AP (2 of 2)]
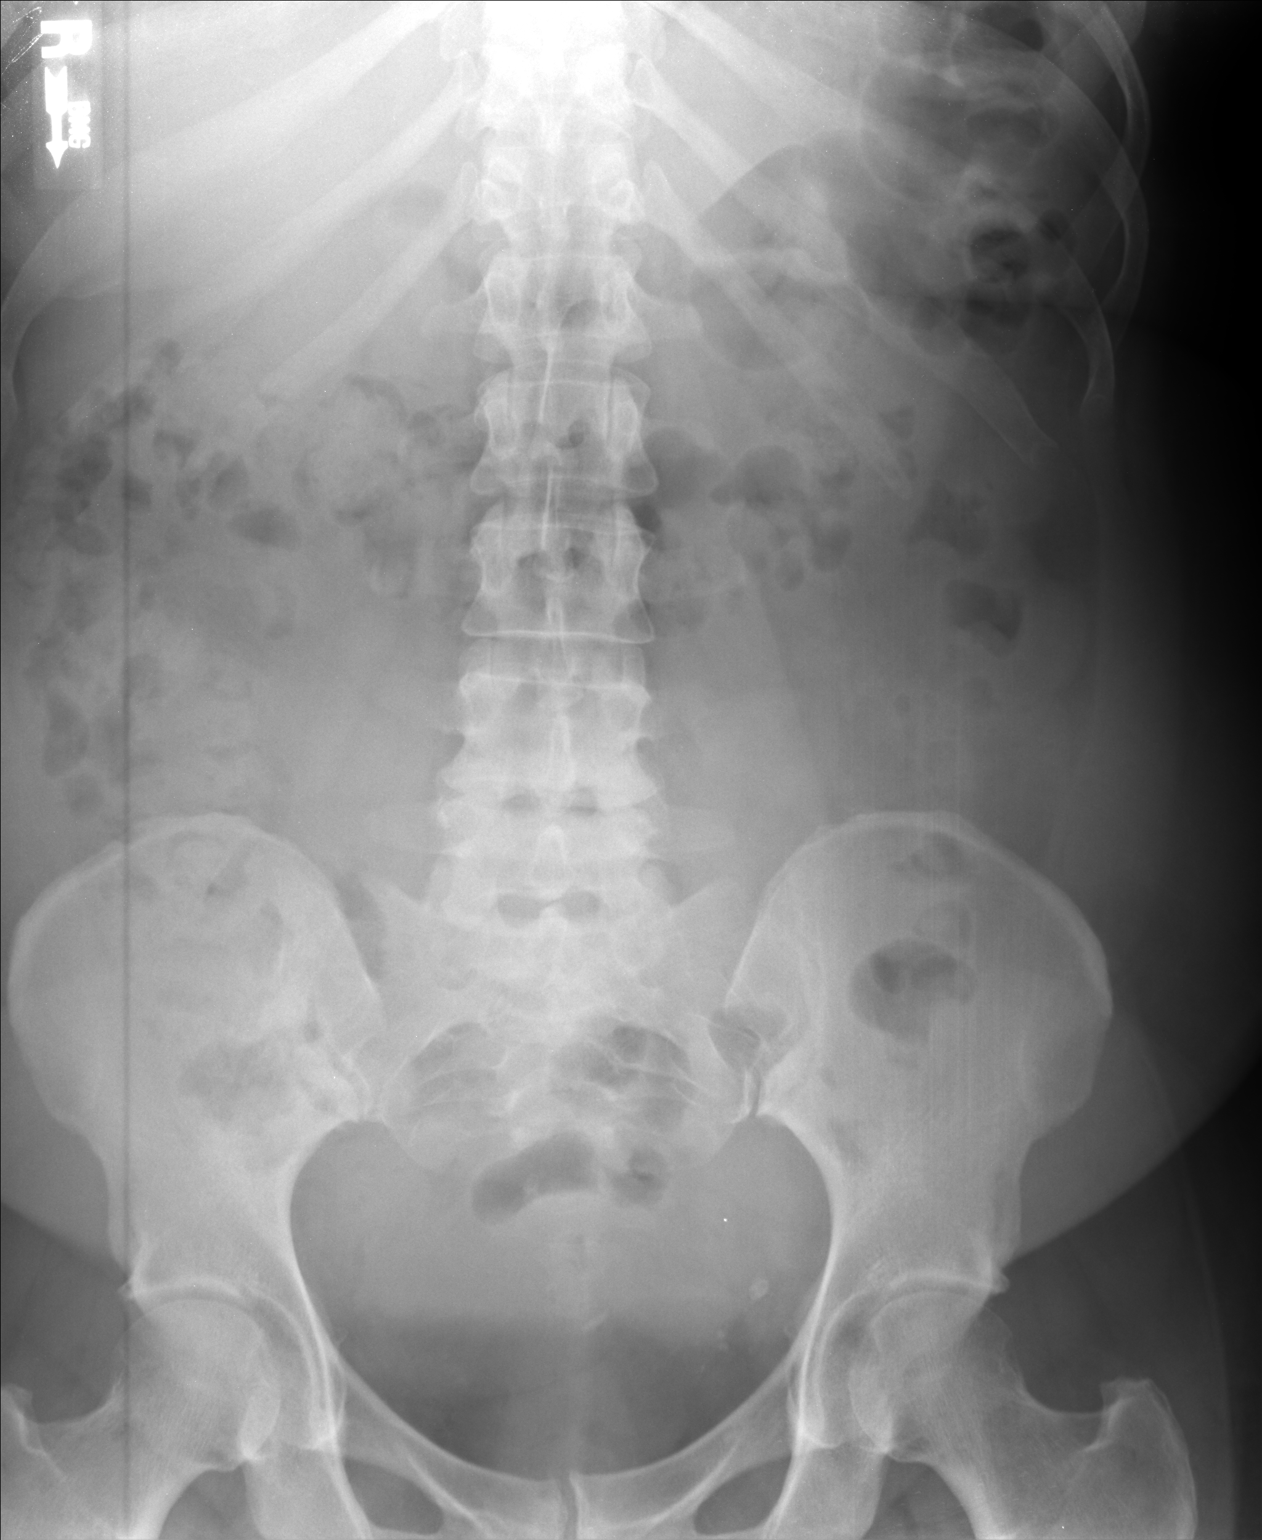

[3 of 3 positions shown; findings below may reference images not displayed]

FINDINGS: There is no evidence of dilated bowel loops or free intraperitoneal
air. No radiopaque calculi or other significant radiographic
abnormality is seen. Heart size and mediastinal contours are within
normal limits. Both lungs are clear.
IMPRESSION: Negative abdominal radiographs.  No acute cardiopulmonary disease.

## 2017-04-20 IMAGING — US US ABDOMEN COMPLETE
1 series · 14 of 25 positions shown · non-contrast
Comparison: None in PACs

CLINICAL DATA: Right upper quadrant epigastric pain with nausea and
vomiting for the past 4 days, history of diabetes.

EXAM:
ULTRASOUND ABDOMEN COMPLETE

[Series 1: us abdomen complete · 0.24mm/px · 14 of 85 slices shown]
[im 1/85]
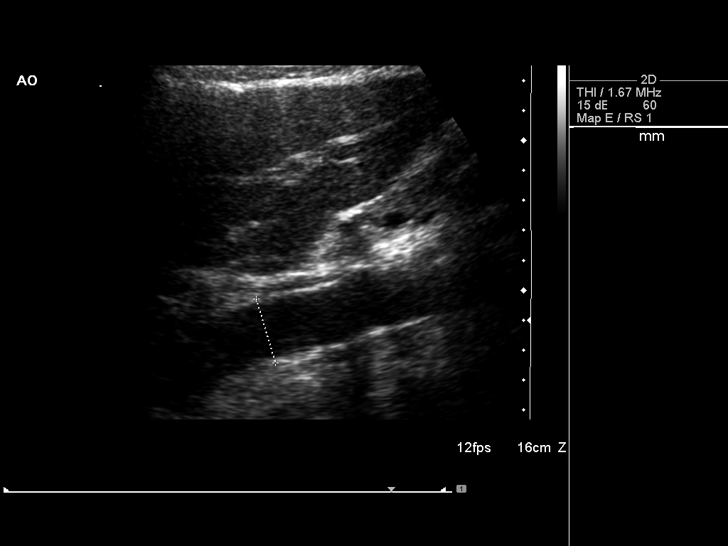
[im 8/85]
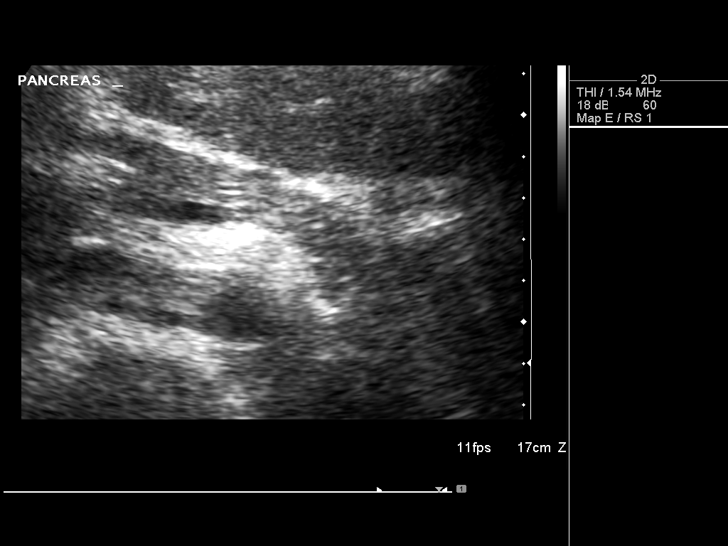
[im 15/85]
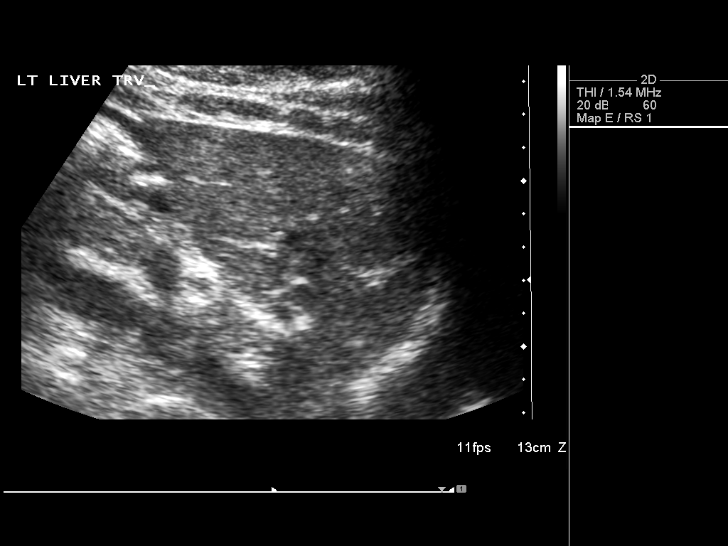
[im 22/85]
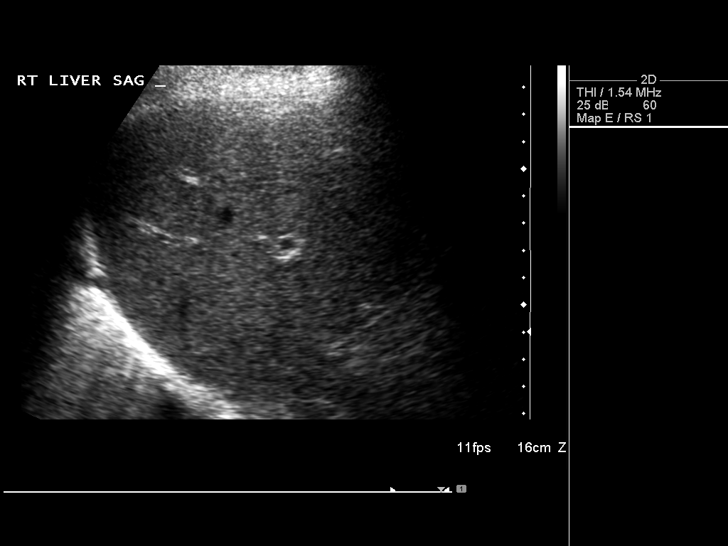
[im 29/85]
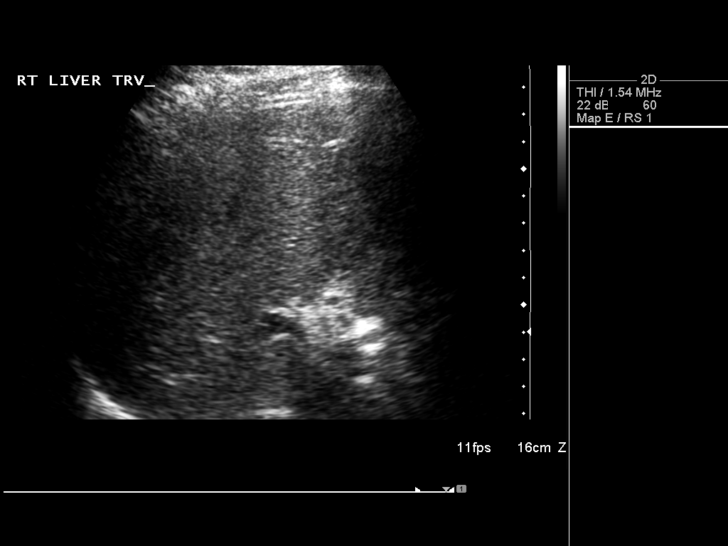
[im 32/85]
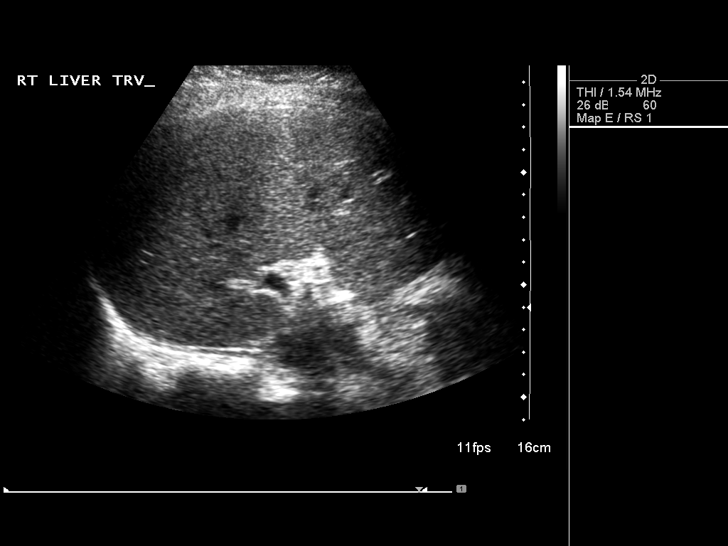
[im 39/85]
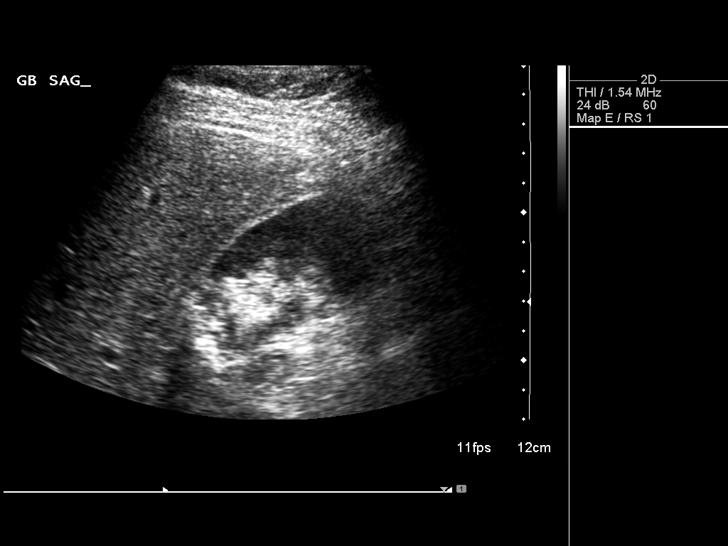
[im 46/85]
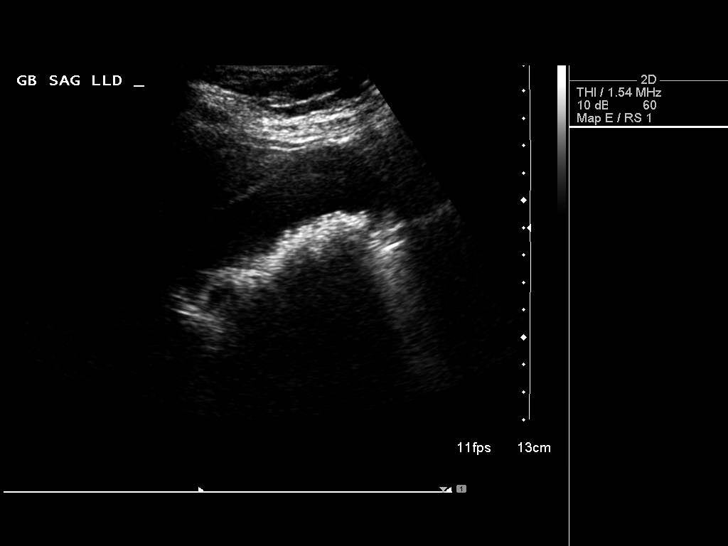
[im 53/85]
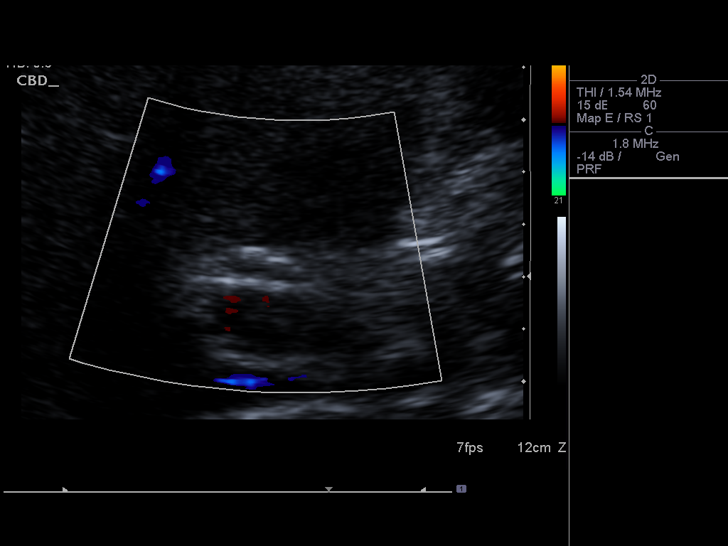
[im 57/85]
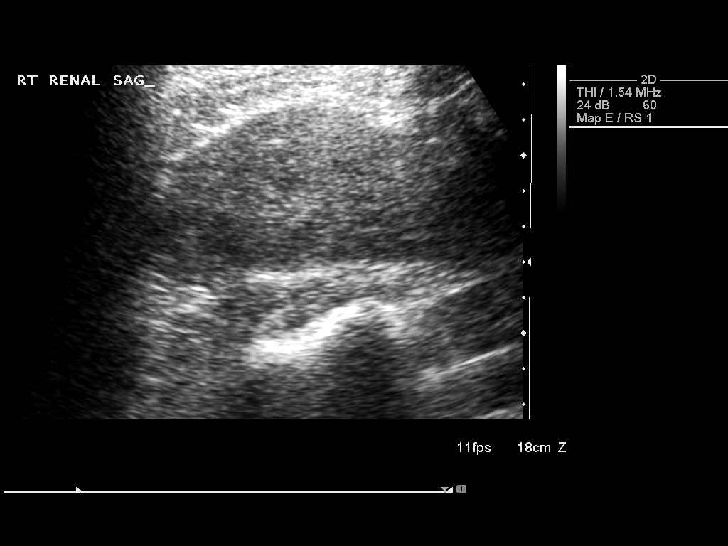
[im 64/85]
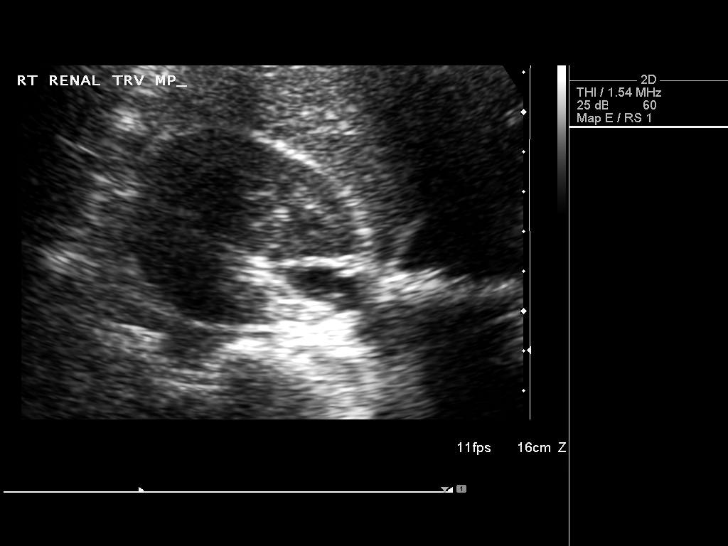
[im 71/85]
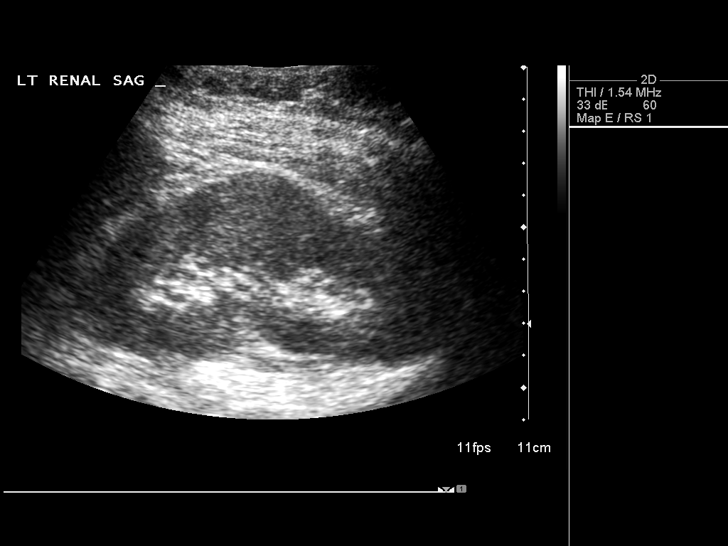
[im 78/85]
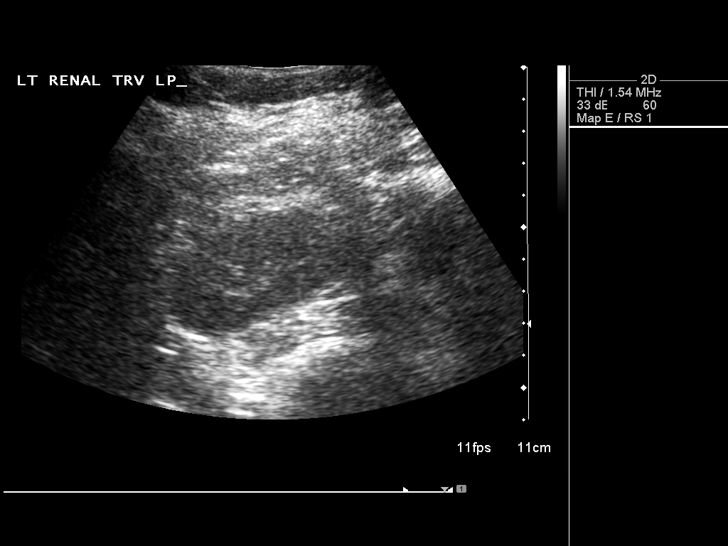
[im 85/85]
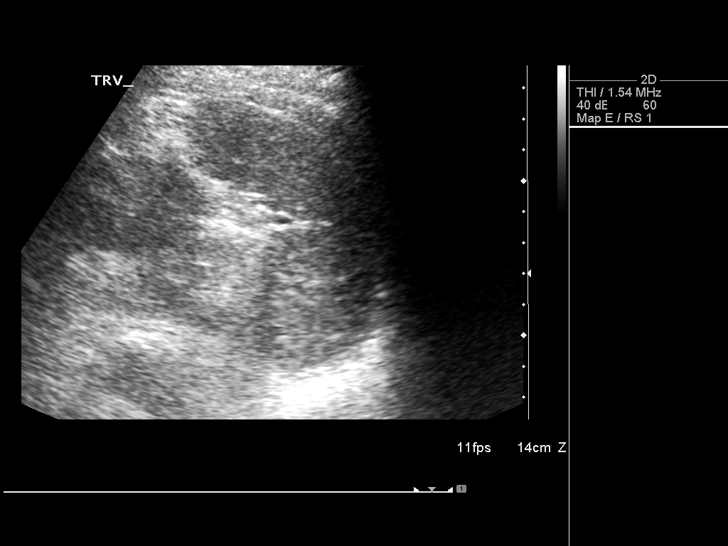

[14 of 25 positions shown; findings below may reference images not displayed]

FINDINGS: Gallbladder: The gallbladder is adequately distended. There are
multiple echogenic mobile shadowing stones. There is no gallbladder
wall thickening, pericholecystic fluid, or positive sonographic
Murphy's sign.

Common bile duct: Diameter: 4.1 mm

Liver: No focal lesion identified. Within normal limits in
parenchymal echogenicity.

IVC: No abnormality visualized.

Pancreas: Visualized portion unremarkable.

Spleen: Size and appearance within normal limits.

Right Kidney: Length: 12.4 cm. Echogenicity within normal limits. No
mass or hydronephrosis visualized.

Left Kidney: Length: 11.6 cm. Echogenicity within normal limits. No
mass or hydronephrosis visualized.

Abdominal aorta: No aneurysm visualized.

Other findings: There is no ascites.
IMPRESSION: 1. Gallstones without sonographic evidence of acute cholecystitis.
2. No acute abnormality is observed elsewhere within the abdomen.

## 2017-04-21 ENCOUNTER — Encounter: Payer: Self-pay | Admitting: Physician Assistant

## 2017-05-05 ENCOUNTER — Other Ambulatory Visit: Payer: Self-pay | Admitting: Family Medicine

## 2017-05-05 DIAGNOSIS — I10 Essential (primary) hypertension: Secondary | ICD-10-CM

## 2017-05-05 DIAGNOSIS — E119 Type 2 diabetes mellitus without complications: Secondary | ICD-10-CM

## 2017-06-04 ENCOUNTER — Other Ambulatory Visit: Payer: Self-pay | Admitting: Family Medicine

## 2017-06-04 DIAGNOSIS — I10 Essential (primary) hypertension: Secondary | ICD-10-CM

## 2017-06-16 ENCOUNTER — Other Ambulatory Visit: Payer: Self-pay | Admitting: Family Medicine

## 2017-06-16 ENCOUNTER — Other Ambulatory Visit: Payer: Self-pay | Admitting: Physician Assistant

## 2017-06-29 ENCOUNTER — Other Ambulatory Visit: Payer: Self-pay | Admitting: Family Medicine

## 2017-07-10 ENCOUNTER — Encounter: Payer: Self-pay | Admitting: Family Medicine

## 2017-07-10 ENCOUNTER — Other Ambulatory Visit: Payer: Self-pay

## 2017-07-10 ENCOUNTER — Ambulatory Visit: Payer: BC Managed Care – PPO | Admitting: Family Medicine

## 2017-07-10 VITALS — BP 122/90 | HR 93 | Temp 98.1°F | Ht 62.0 in | Wt 186.0 lb

## 2017-07-10 DIAGNOSIS — E119 Type 2 diabetes mellitus without complications: Secondary | ICD-10-CM | POA: Diagnosis not present

## 2017-07-10 DIAGNOSIS — I10 Essential (primary) hypertension: Secondary | ICD-10-CM

## 2017-07-10 DIAGNOSIS — N924 Excessive bleeding in the premenopausal period: Secondary | ICD-10-CM | POA: Diagnosis not present

## 2017-07-10 DIAGNOSIS — E785 Hyperlipidemia, unspecified: Secondary | ICD-10-CM | POA: Diagnosis not present

## 2017-07-10 DIAGNOSIS — D649 Anemia, unspecified: Secondary | ICD-10-CM | POA: Diagnosis not present

## 2017-07-10 MED ORDER — HYDROCHLOROTHIAZIDE 25 MG PO TABS: ORAL_TABLET | ORAL | 1 refills | Status: DC

## 2017-07-10 MED ORDER — METFORMIN HCL 1000 MG PO TABS: 1000.0000 mg | ORAL_TABLET | Freq: Two times a day (BID) | ORAL | 1 refills | Status: DC

## 2017-07-10 MED ORDER — NIACIN ER (ANTIHYPERLIPIDEMIC) 750 MG PO TBCR: 1500.0000 mg | EXTENDED_RELEASE_TABLET | Freq: Every day | ORAL | 1 refills | Status: DC

## 2017-07-10 NOTE — Patient Instructions (Addendum)
I will refer you to gynecology to discuss the perimenopausal bleeding.   No change in meds for now.   Please follow up in 6 months for diabetes and physical at that time.   Let me know if you find the date of your last eye care provider visit so I can update your chart.   Thanks for coming in today.   IF you received an x-ray today, you will receive an invoice from [facility]. Please contact [facility] at [phone] with questions or concerns regarding your invoice.   IF you received labwork today, you will receive an invoice from [place]. Please contact [facility] at [phone] with questions or concerns regarding your invoice.   Our billing staff will not be able to assist you with questions regarding bills from these companies.  You will be contacted with the lab results as soon as they are available. The fastest way to get your results is to activate your My Chart account. Instructions are located on the last page of this paperwork. If you have not heard from [place] regarding the results in 2 weeks, please contact this office.

## 2017-07-10 NOTE — Progress Notes (Signed)
Subjective:  By signing my name below, I, Stephanie Nunez, attest that this documentation has been prepared under the direction and in the presence of Stephanie Agreste, MD Electronically Signed: Ladene Artist, ED Scribe 07/10/2017 at 10:56 AM.   Patient ID: Stephanie Nunez, female    DOB: May 14, 1960, 57 y.o.   MRN: [id number]  Chief Complaint  Patient presents with  . Chronic Condition    6 month follow up. ?menopause blood work?  . Diabetes  . Hyperlipidemia   HPI Stephanie Nunez is a 57 y.o. female who presents to Primary Care at [facility] for f/u. Last seen for CPE in Sept.  Hyperlipidemia Lab Results  Component Value Date   CHOL 249 (H) 09/21/2016   HDL 59 09/21/2016   LDLCALC 174 (H) 09/21/2016   TRIG 79 09/21/2016   CHOLHDL 4.2 09/21/2016   Lab Results  Component Value Date   ALT 17 09/21/2016   AST 26 09/21/2016   ALKPHOS 63 09/21/2016   BILITOT 0.7 09/21/2016  Unable to take statins. Didn't tolerate Zetia. Returned to use of niaspan 750 mg CR 2 tabs qhs based on Dec notes. - She is taking aspirin with niaspan. Denies blood in stools.  DM Lab Results  Component Value Date   HGBA1C 6.9 (H) 09/21/2016  Urine micro Sept 2018 ordered but not completed. Metformin 1000 mg bid. Optho: 02/2017. Foot exam: 09/21/16. Pneumovax: 09/09/15. - Denies new side-effects. She is still exercising 4-5 days/wk. Pt is trying to cut back on fast foods but is having a hard time letting go of [employer]. Denies drinking sweet tea but drinks ~12 oz of coffee daily.  HTN BP Readings from Last 3 Encounters:  07/10/17 122/90  01/01/17 132/88  09/21/16 (!) 146/91   Lab Results  Component Value Date   CREATININE 0.63 09/21/2016  HCTZ 25 mg qd.  Menopause Pt states that she had monthly menstrual periods until June 2018 when they stopped. She did have another period that lasted for 6 days in April 2019 but haven't had another since. OB/GYN Dr. Gaetano Net with [facility].  Patient Active  Problem List   Diagnosis Date Noted  . Epiphora 01/05/2016  . HTN (hypertension) 02/01/2011  . Hyperlipemia 02/01/2011  . Obesity (BMI 30-39.9) 02/01/2011  . DM type 2 (diabetes mellitus, type 2) ([place]) 02/01/2011   Past Medical History:  Diagnosis Date  . Allergy   . Anemia   . Diabetes type 2, controlled ([place])   . Gestational diabetes mellitus   . Hyperlipidemia    no meds  . Hypertension   . Uterine fibroid    Past Surgical History:  Procedure Laterality Date  . BREAST SURGERY     fatty tumor under left breast  . LIPOMA EXCISION     Left chest wall  . LUMBAR SPINE SURGERY     L4-5   Allergies  Allergen Reactions  . Simvastatin     myalgia  . Statins     Severe musculoskeletal pains.   Prior to Admission medications   Medication Sig Start Date End Date Taking? Authorizing Provider  aspirin 81 MG tablet Take 81 mg by mouth daily.    [provider]  azelastine (ASTELIN) 0.1 % nasal spray PLACE 2 SPRAYS IN EACH NOSTRIL TWICE DAILY AS DIRECTED 09/21/16   Stephanie Agreste, MD  cholecalciferol (VITAMIN D) 1000 units tablet Take 2,000 Units by mouth daily.    [provider]  cyclobenzaprine (FLEXERIL) 10 MG tablet TAKE 1/2  TO 1 TABLET(5 TO 10 MG) BY MOUTH AT BEDTIME AS NEEDED FOR MUSCLE SPASMS 02/06/17   Harrison Mons, PA-C  fluticasone (FLONASE) 50 MCG/ACT nasal spray PLACE 2 SPRAYS INTO THE NOSE DAILY 09/21/16   Stephanie Agreste, MD  hydrochlorothiazide (HYDRODIURIL) 25 MG tablet TAKE 1 TABLET(25 MG) BY MOUTH DAILY- patient needs office visit for more refills 05/05/17   Stephanie Agreste, MD  meloxicam (MOBIC) 15 MG tablet TAKE 1 TABLET(15 MG) BY MOUTH DAILY 03/04/17   Harrison Mons, PA-C  metFORMIN (GLUCOPHAGE) 1000 MG tablet Take 1 tablet (1,000 mg total) by mouth 2 (two) times daily with a meal. Office visit needed for refills 06/16/17   Stephanie Agreste, MD  Multiple Vitamin (MULTIVITAMIN) tablet Take 1 tablet by mouth daily.    [provider]    niacin (NIASPAN) 750 MG CR tablet Take 2 tablets (1,500 mg total) by mouth at bedtime. 09/21/16   Stephanie Agreste, MD   Social History   Socioeconomic History  . Marital status: Married    Spouse name: Stephanie Nunez  . Number of children: 1  . Years of education: 60  . Highest education level: Not on file  Occupational History  . Occupation: Control and instrumentation engineer  Social Needs  . Financial resource strain: Not on file  . Food insecurity:    Worry: Not on file    Inability: Not on file  . Transportation needs:    Medical: Not on file    Non-medical: Not on file  Tobacco Use  . Smoking status: Never Smoker  . Smokeless tobacco: Never Used  Substance and Sexual Activity  . Alcohol use: Yes    Alcohol/week: 2.4 oz    Types: 4 Glasses of wine per week    Comment: 4 glasses wine a week   . Drug use: No  . Sexual activity: Yes    Partners: Male    Birth control/protection: Condom  Lifestyle  . Physical activity:    Days per week: Not on file    Minutes per session: Not on file  . Stress: Not on file  Relationships  . Social connections:    Talks on phone: Not on file    Gets together: Not on file    Attends religious service: Not on file    Active member of club or organization: Not on file    Attends meetings of clubs or organizations: Not on file    Relationship status: Not on file  . Intimate partner violence:    Fear of current or ex partner: Not on file    Emotionally abused: Not on file    Physically abused: Not on file    Forced sexual activity: Not on file  Other Topics Concern  . Not on file  Social History Narrative   Lives with her husband (a Software engineer) and their daughter.  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  Respiratory: Negative for chest tightness and shortness of breath.   Cardiovascular: Negative for chest pain, palpitations and leg  swelling.  Gastrointestinal: Negative for abdominal pain and blood in stool.  Genitourinary: Positive for menstrual problem.  Neurological: Negative for dizziness, syncope, light-headedness and headaches.      Objective:   Physical Exam  Constitutional: She is oriented to person, place, and time. She appears well-developed and well-nourished.  HENT:  Head: Normocephalic and atraumatic.  Eyes: Pupils are  equal, round, and reactive to light. Conjunctivae and EOM are normal.  Neck: Carotid bruit is not present.  Cardiovascular: Normal rate, regular rhythm, normal heart sounds and intact distal pulses.  Pulmonary/Chest: Effort normal and breath sounds normal.  Abdominal: Soft. She exhibits no pulsatile midline mass. There is no tenderness.  Neurological: She is alert and oriented to person, place, and time.  Skin: Skin is warm and dry.  Psychiatric: She has a normal mood and affect. Her behavior is normal.  Vitals reviewed.  Vitals:   07/10/17 1039  BP: 122/90  Pulse: 93  Temp: 98.1 F (36.7 C)  TempSrc: Oral  SpO2: 93%  Weight: 186 lb (84.4 kg)  Height: 5\' 2"  (1.575 m)      Assessment & Plan:   JASELYNN TAMAS is a 57 y.o. female Hyperlipidemia, unspecified hyperlipidemia type - Plan: Lipid panel, Comprehensive metabolic panel, niacin (NIASPAN) 750 MG CR tablet  -Check labs, continue Niaspan at current dose as intolerant to statins.  Type 2 diabetes mellitus without complication, unspecified whether long term insulin use ([place]) - Plan: Hemoglobin A1c  -Check A1c, continue same dose of metformin, check microalbumin.  Continue to work on diet and fast food avoidance, as well as exercise for weight loss.  Essential hypertension - Plan: hydrochlorothiazide (HYDRODIURIL) 25 MG tablet Hypertension, unspecified type - Plan: Microalbumin, urine, Comprehensive metabolic panel Stable, tolerating current regimen.  Check microalbumin, CMP, no change in dose of HCTZ at this  time.  Anemia, unspecified type, Menopausal bleeding - Plan: Ambulatory referral to Gynecology  -Perimenopausal bleeding versus postmenopausal depending on timing of last menses.  We will have her discuss this further with gynecology to see if further work-up needed versus continued monitoring.   Meds ordered this encounter  Medications  . metFORMIN (GLUCOPHAGE) 1000 MG tablet    Sig: Take 1 tablet (1,000 mg total) by mouth 2 (two) times daily with a meal.    Dispense:  180 tablet    Refill:  1  . niacin (NIASPAN) 750 MG CR tablet    Sig: Take 2 tablets (1,500 mg total) by mouth at bedtime.    Dispense:  180 tablet    Refill:  1  . hydrochlorothiazide (HYDRODIURIL) 25 MG tablet    Sig: TAKE 1 TABLET(25 MG) BY MOUTH DAILY    Dispense:  90 tablet    Refill:  1    **Patient requests 90 days supply**   Patient Instructions   I will refer you to gynecology to discuss the perimenopausal bleeding.   No change in meds for now.   Please follow up in 6 months for diabetes and physical at that time.   Let me know if you find the date of your last eye care provider visit so I can update your chart.   Thanks for coming in today.   IF you received an x-ray today, you will receive an invoice from [facility]. Please contact [facility] at [phone] with questions or concerns regarding your invoice.   IF you received labwork today, you will receive an invoice from [facility]. Please contact [facility] at [phone] with questions or concerns regarding your invoice.   Our billing staff will not be able to assist you with questions regarding bills from these companies.  You will be contacted with the lab results as soon as they are available. The fastest way to get your results is to activate your My Chart account. Instructions are located on the last page of this paperwork. If  you have not heard from [place] regarding the results in 2 weeks, please contact this office.        I personally performed the services described in this documentation, which was scribed in my presence. The recorded information has been reviewed and considered for accuracy and completeness, addended by me as needed, and agree with information above.  Signed,   Merri Ray, MD Primary [facility] at [facility].  07/11/17 1:40 PM

## 2017-07-11 LAB — COMPREHENSIVE METABOLIC PANEL
ALBUMIN: 4.8 g/dL (ref 3.5–5.5)
ALK PHOS: 61 IU/L (ref 39–117)
ALT: 15 IU/L (ref 0–32)
AST: 17 IU/L (ref 0–40)
Albumin/Globulin Ratio: 1.8 (ref 1.2–2.2)
BUN / CREAT RATIO: 13 (ref 9–23)
BUN: 8 mg/dL (ref 6–24)
Bilirubin Total: 0.8 mg/dL (ref 0.0–1.2)
CO2: 29 mmol/L (ref 20–29)
CREATININE: 0.62 mg/dL (ref 0.57–1.00)
Calcium: 9.5 mg/dL (ref 8.7–10.2)
Chloride: 96 mmol/L (ref 96–106)
GFR calc Af Amer: 117 mL/min/{1.73_m2} (ref >59)
GFR calc non Af Amer: 101 mL/min/{1.73_m2} (ref >59)
GLOBULIN, TOTAL: 2.6 g/dL (ref 1.5–4.5)
GLUCOSE: 107 mg/dL — AB (ref 65–99)
Potassium: 3.7 mmol/L (ref 3.5–5.2)
SODIUM: 140 mmol/L (ref 134–144)
TOTAL PROTEIN: 7.4 g/dL (ref 6.0–8.5)

## 2017-07-11 LAB — LIPID PANEL
CHOL/HDL RATIO: 4.5 ratio — AB (ref 0.0–4.4)
Cholesterol, Total: 254 mg/dL — ABNORMAL HIGH (ref 100–199)
HDL: 57 mg/dL (ref >39)
LDL Calculated: 178 mg/dL — ABNORMAL HIGH (ref 0–99)
TRIGLYCERIDES: 97 mg/dL (ref 0–149)
VLDL CHOLESTEROL CAL: 19 mg/dL (ref 5–40)

## 2017-07-11 LAB — HEMOGLOBIN A1C
Est. average glucose Bld gHb Est-mCnc: 146 mg/dL
HEMOGLOBIN A1C: 6.7 % — AB (ref 4.8–5.6)

## 2017-07-11 LAB — MICROALBUMIN, URINE: Microalbumin, Urine: 3.8 ug/mL

## 2017-07-17 ENCOUNTER — Other Ambulatory Visit: Payer: Self-pay | Admitting: Family Medicine

## 2017-07-17 DIAGNOSIS — I10 Essential (primary) hypertension: Secondary | ICD-10-CM

## 2017-07-18 ENCOUNTER — Encounter: Payer: Self-pay | Admitting: Family Medicine

## 2017-07-21 ENCOUNTER — Encounter: Payer: Self-pay | Admitting: Family Medicine

## 2017-07-30 ENCOUNTER — Encounter: Payer: Self-pay | Admitting: *Deleted

## 2017-08-14 ENCOUNTER — Encounter: Payer: Self-pay | Admitting: *Deleted

## 2017-10-05 ENCOUNTER — Other Ambulatory Visit: Payer: Self-pay | Admitting: Family Medicine

## 2017-10-05 DIAGNOSIS — J302 Other seasonal allergic rhinitis: Secondary | ICD-10-CM

## 2017-10-06 NOTE — Telephone Encounter (Signed)
fluticasone 50 mcg/act  prescription expired 10/01/17 LR 09/21/16 and with 3 refills J. Carlota Raspberry and LOV  07/10/17 [facility] # [id number]

## 2017-11-09 ENCOUNTER — Other Ambulatory Visit: Payer: Self-pay | Admitting: Family Medicine

## 2017-11-09 DIAGNOSIS — J302 Other seasonal allergic rhinitis: Secondary | ICD-10-CM

## 2018-01-08 ENCOUNTER — Other Ambulatory Visit: Payer: Self-pay | Admitting: Family Medicine

## 2018-01-08 NOTE — Telephone Encounter (Signed)
Requested Prescriptions  Pending Prescriptions Disp Refills  . metFORMIN (GLUCOPHAGE) 1000 MG tablet [Pharmacy Med Name: METFORMIN 1000MG TABLETS] 60 tablet     Sig: TAKE 1 TABLET(1000 MG) BY MOUTH TWICE DAILY WITH A MEAL     Endocrinology:  Diabetes - Biguanides Failed - 01/08/2018  3:11 AM      Failed - HBA1C is between 0 and 7.9 and within 180 days    Hgb A1c MFr Bld  Date Value Ref Range Status  07/10/2017 6.7 (H) 4.8 - 5.6 % Final    Comment:             Prediabetes: 5.7 - 6.4          Diabetes: >6.4          Glycemic control for adults with diabetes: <7.0          Failed - Valid encounter within last 6 months    Recent Outpatient Visits          6 months ago Hyperlipidemia, unspecified hyperlipidemia type   [facility], Ranell Patrick, MD   1 year ago Left shoulder pain, unspecified chronicity   Primary Care at [facility], [place], [place]   1 year ago Annual physical exam   [facility], Ranell Patrick, MD   2 years ago Type 2 diabetes mellitus without complication, without long-term current use of insulin [facility])   [facility], Damaris Hippo, PA-C   3 years ago Anemia, unspecified anemia type   [facility] at [facility], MD      Future Appointments            In 1 month [name], MD Primary [facility] at [place], [place] in normal range and within 360 days    Creat  Date Value Ref Range Status  09/09/2015 0.64 0.50 - 1.05 mg/dL Final    Comment:      For patients > or = 56 years of age: The upper reference limit for Creatinine is approximately 13% higher for people identified as African-American.      Creatinine, Ser  Date Value Ref Range Status  07/10/2017 0.62 0.57 - 1.00 mg/dL Final         Passed - eGFR in normal range and within 360 days    GFR, Est African American  Date Value Ref Range Status  09/09/2015 >89 >=60 mL/min Final   GFR calc Af Amer  Date Value Ref Range Status   07/10/2017 117 >59 mL/min/1.73 Final   GFR, Est Non African American  Date Value Ref Range Status  09/09/2015 >89 >=60 mL/min Final   GFR calc non Af Amer  Date Value Ref Range Status  07/10/2017 101 >59 mL/min/1.73 Final

## 2018-01-09 ENCOUNTER — Other Ambulatory Visit: Payer: Self-pay | Admitting: Family Medicine

## 2018-01-09 DIAGNOSIS — I10 Essential (primary) hypertension: Secondary | ICD-10-CM

## 2018-01-09 NOTE — Telephone Encounter (Signed)
OV in 1 month Requested Prescriptions  Pending Prescriptions Disp Refills  . hydrochlorothiazide (HYDRODIURIL) 25 MG tablet [Pharmacy Med Name: HYDROCHLOROTHIAZIDE 25MG  TABLETS] 90 tablet 0    Sig: TAKE 1 TABLET BY MOUTH EVERY DAY     Cardiovascular: Diuretics - Thiazide Failed - 01/09/2018  3:11 AM      Failed - Last BP in normal range    BP Readings from Last 1 Encounters:  07/10/17 122/90         Failed - Valid encounter within last 6 months    Recent Outpatient Visits          6 months ago Hyperlipidemia, unspecified hyperlipidemia type   [facility], Ranell Patrick, MD   1 year ago Left shoulder pain, unspecified chronicity   Primary Care at [facility], [place], [place]   1 year ago Annual physical exam   [facility], Ranell Patrick, MD   2 years ago Type 2 diabetes mellitus without complication, without long-term current use of insulin [facility])   [facility], Damaris Hippo, PA-C   3 years ago Anemia, unspecified anemia type   [facility] at [facility], Synetta Shadow, MD      Future Appointments            In 1 month [name], MD Primary Care at [place], [place] in normal range and within 360 days    Calcium  Date Value Ref Range Status  07/10/2017 9.5 8.7 - 10.2 mg/dL Final         Passed - Cr in normal range and within 360 days    Creat  Date Value Ref Range Status  09/09/2015 0.64 0.50 - 1.05 mg/dL Final    Comment:      For patients > or = 56 years of age: The upper reference limit for Creatinine is approximately 13% higher for people identified as African-American.      Creatinine, Ser  Date Value Ref Range Status  07/10/2017 0.62 0.57 - 1.00 mg/dL Final         Passed - K in normal range and within 360 days    Potassium  Date Value Ref Range Status  07/10/2017 3.7 3.5 - 5.2 mmol/L Final         Passed - Na in normal range and within 360 days    Sodium  Date Value Ref Range Status   07/10/2017 140 134 - 144 mmol/L Final

## 2018-02-09 ENCOUNTER — Other Ambulatory Visit: Payer: Self-pay | Admitting: Family Medicine

## 2018-02-09 DIAGNOSIS — I10 Essential (primary) hypertension: Secondary | ICD-10-CM

## 2018-02-09 NOTE — Telephone Encounter (Signed)
LR 01/09/18 for 90 tabs NOV  02/11/2018. Requested Prescriptions  Refused Prescriptions Disp Refills  . hydrochlorothiazide (HYDRODIURIL) 25 MG tablet [Pharmacy Med Name: HYDROCHLOROTHIAZIDE 25MG  TABLETS] 30 tablet 0    Sig: TAKE 1 TABLET BY MOUTH EVERY DAY     Cardiovascular: Diuretics - Thiazide Failed - 02/09/2018  3:11 AM      Failed - Last BP in normal range    BP Readings from Last 1 Encounters:  07/10/17 122/90         Failed - Valid encounter within last 6 months    Recent Outpatient Visits          7 months ago Hyperlipidemia, unspecified hyperlipidemia type   [facility], Ranell Patrick, MD   1 year ago Left shoulder pain, unspecified chronicity   Primary Care at [facility], [place], [place]   1 year ago Annual physical exam   [facility] at [facility], Ranell Patrick, MD   2 years ago Type 2 diabetes mellitus without complication, without long-term current use of insulin [facility])   [facility] at [facility], Damaris Hippo, PA-C   3 years ago Anemia, unspecified anemia type   Primary Care at [place], MD      Future Appointments            In 2 days [name], MD Primary Care at [place], [place] in normal range and within 360 days    Calcium  Date Value Ref Range Status  07/10/2017 9.5 8.7 - 10.2 mg/dL Final         Passed - Cr in normal range and within 360 days    Creat  Date Value Ref Range Status  09/09/2015 0.64 0.50 - 1.05 mg/dL Final    Comment:      For patients > or = 57 years of age: The upper reference limit for Creatinine is approximately 13% higher for people identified as African-American.      Creatinine, Ser  Date Value Ref Range Status  07/10/2017 0.62 0.57 - 1.00 mg/dL Final         Passed - K in normal range and within 360 days    Potassium  Date Value Ref Range Status  07/10/2017 3.7 3.5 - 5.2 mmol/L Final         Passed - Na in normal range and within 360 days    Sodium  Date  Value Ref Range Status  07/10/2017 140 134 - 144 mmol/L Final

## 2018-02-11 ENCOUNTER — Other Ambulatory Visit: Payer: Self-pay

## 2018-02-11 ENCOUNTER — Encounter: Payer: Self-pay | Admitting: Family Medicine

## 2018-02-11 ENCOUNTER — Ambulatory Visit (INDEPENDENT_AMBULATORY_CARE_PROVIDER_SITE_OTHER): Payer: BC Managed Care – PPO | Admitting: Family Medicine

## 2018-02-11 VITALS — BP 138/80 | HR 86 | Temp 98.6°F | Ht 62.5 in | Wt 187.2 lb

## 2018-02-11 DIAGNOSIS — E119 Type 2 diabetes mellitus without complications: Secondary | ICD-10-CM

## 2018-02-11 DIAGNOSIS — N841 Polyp of cervix uteri: Secondary | ICD-10-CM | POA: Diagnosis not present

## 2018-02-11 DIAGNOSIS — I10 Essential (primary) hypertension: Secondary | ICD-10-CM | POA: Diagnosis not present

## 2018-02-11 DIAGNOSIS — Z0000 Encounter for general adult medical examination without abnormal findings: Secondary | ICD-10-CM

## 2018-02-11 DIAGNOSIS — Z0001 Encounter for general adult medical examination with abnormal findings: Secondary | ICD-10-CM | POA: Diagnosis not present

## 2018-02-11 DIAGNOSIS — E785 Hyperlipidemia, unspecified: Secondary | ICD-10-CM

## 2018-02-11 LAB — COMPREHENSIVE METABOLIC PANEL
ALBUMIN: 4.8 g/dL (ref 3.8–4.9)
ALT: 17 IU/L (ref 0–32)
AST: 20 IU/L (ref 0–40)
Albumin/Globulin Ratio: 2 (ref 1.2–2.2)
Alkaline Phosphatase: 66 IU/L (ref 39–117)
BUN/Creatinine Ratio: 12 (ref 9–23)
BUN: 7 mg/dL (ref 6–24)
Bilirubin Total: 0.7 mg/dL (ref 0.0–1.2)
CO2: 26 mmol/L (ref 20–29)
Calcium: 9.5 mg/dL (ref 8.7–10.2)
Chloride: 92 mmol/L — ABNORMAL LOW (ref 96–106)
Creatinine, Ser: 0.6 mg/dL (ref 0.57–1.00)
GFR calc Af Amer: 118 mL/min/{1.73_m2} (ref >59)
GFR, EST NON AFRICAN AMERICAN: 102 mL/min/{1.73_m2} (ref >59)
GLOBULIN, TOTAL: 2.4 g/dL (ref 1.5–4.5)
Glucose: 123 mg/dL — ABNORMAL HIGH (ref 65–99)
Potassium: 3.5 mmol/L (ref 3.5–5.2)
Sodium: 138 mmol/L (ref 134–144)
Total Protein: 7.2 g/dL (ref 6.0–8.5)

## 2018-02-11 LAB — HEMOGLOBIN A1C
Est. average glucose Bld gHb Est-mCnc: 146 mg/dL
Hgb A1c MFr Bld: 6.7 % — ABNORMAL HIGH (ref 4.8–5.6)

## 2018-02-11 LAB — LIPID PANEL
CHOLESTEROL TOTAL: 237 mg/dL — AB (ref 100–199)
Chol/HDL Ratio: 4.1 ratio (ref 0.0–4.4)
HDL: 58 mg/dL (ref >39)
LDL Calculated: 158 mg/dL — ABNORMAL HIGH (ref 0–99)
Triglycerides: 107 mg/dL (ref 0–149)
VLDL Cholesterol Cal: 21 mg/dL (ref 5–40)

## 2018-02-11 MED ORDER — ATORVASTATIN CALCIUM 10 MG PO TABS: 10.0000 mg | ORAL_TABLET | ORAL | 1 refills | Status: DC

## 2018-02-11 MED ORDER — METFORMIN HCL 1000 MG PO TABS: ORAL_TABLET | ORAL | 2 refills | Status: DC

## 2018-02-11 MED ORDER — HYDROCHLOROTHIAZIDE 25 MG PO TABS: ORAL_TABLET | ORAL | 2 refills | Status: DC

## 2018-02-11 NOTE — Addendum Note (Signed)
Addended by: Gari Crown D on: 02/11/2018 09:42 AM   Modules accepted: Orders

## 2018-02-11 NOTE — Progress Notes (Signed)
Subjective:    Patient ID: Stephanie Nunez, female    DOB: 01/28/1960, 58 y.o.   MRN: [id number]  HPI  Stephanie Nunez is a 58 y.o. female Presents today for: Chief Complaint  Patient presents with  . Annual Exam    CPE w/ PAP  . Diabetes   Hypertension: BP Readings from Last 3 Encounters:  02/11/18 138/80  07/10/17 122/90  01/01/17 132/88   Lab Results  Component Value Date   CREATININE 0.62 07/10/2017  Currently on HCTZ 25 mg daily. No new side effects on home meds.  Diabetes: Currently on metformin 1000 mg twice daily.  No new side effects. No GI intolerance. No home readings.  Intolerant to statins, does take niacin.   Stopped eating meet in August last year. Other protein sources.   Microalbumin: normal on 07/10/2016. Optho, foot exam, pneumovax: Up-to-date Diabetic Foot Exam - Simple   Simple Foot Form Diabetic Foot exam was performed with the following findings:  Yes 02/11/2018  8:17 AM  Visual Inspection No deformities, no ulcerations, no other skin breakdown bilaterally:  Yes Sensation Testing Intact to touch and monofilament testing bilaterally:  Yes Pulse Check Posterior Tibialis and Dorsalis pulse intact bilaterally:  Yes Comments     Lab Results  Component Value Date   HGBA1C 6.7 (H) 07/10/2017   HGBA1C 6.9 (H) 09/21/2016   HGBA1C 6.4 (H) 09/09/2015   Lab Results  Component Value Date   MICROALBUR 1.0 09/09/2015   LDLCALC 178 (H) 07/10/2017   CREATININE 0.62 07/10/2017    Hyperlipidemia: On niacin. Intolerant to statins prior. Option of combining low-dose statin every other day or intermittently with co-Q10 supplement discussed in July of last year. Tried to restart daily. Bones ached. Did not try to space it out.  Lab Results  Component Value Date   CHOL 254 (H) 07/10/2017   HDL 57 07/10/2017   LDLCALC 178 (H) 07/10/2017   TRIG 97 07/10/2017   CHOLHDL 4.5 (H) 07/10/2017   Lab Results  Component Value Date   ALT 15 07/10/2017   AST  17 07/10/2017   ALKPHOS 61 07/10/2017   BILITOT 0.8 07/10/2017    Cancer screening: Colonoscopy 12/06/2015 Mammogram 02/01/2017 - has not scheduled yet.  Pap testing 10/11/2014, plans to have today.   Immunization History  Administered Date(s) Administered  . Influenza Split 10/31/2011  . Influenza,inj,Quad PF,6+ Mos 10/18/2012, 09/09/2015, 09/21/2016  . Influenza-Unspecified 11/05/2013, 10/07/2017  . Pneumococcal Polysaccharide-23 09/09/2015  . Tdap 11/13/2004, 09/09/2015  . Zoster 05/14/2012  shingles vaccine: plans on getting Shingrix at [facility].   Depression screen [facility] 2/9 02/11/2018 07/10/2017 01/01/2017 09/21/2016 09/09/2015  Decreased Interest 0 0 0 0 0  Down, Depressed, Hopeless 0 0 0 0 0  PHQ - 2 Score 0 0 0 0 0    Vision:  Visual Acuity Screening   Right eye Left eye Both eyes  Without correction:     With correction: 20/25 20/15-1 20/15   Saw optho March 2019.   Dental: every 6 months.   Exercise: 4-5 days per week.     Patient Active Problem List   Diagnosis Date Noted  . Epiphora 01/05/2016  . HTN (hypertension) 02/01/2011  . Hyperlipemia 02/01/2011  . Obesity (BMI 30-39.9) 02/01/2011  . DM type 2 (diabetes mellitus, type 2) ([place]) 02/01/2011   Past Medical History:  Diagnosis Date  . Allergy   . Anemia   . Diabetes type 2, controlled ([place])   . Gestational diabetes mellitus   .  Hyperlipidemia    no meds  . Hypertension   . Uterine fibroid    Past Surgical History:  Procedure Laterality Date  . BREAST SURGERY     fatty tumor under left breast  . LIPOMA EXCISION     Left chest wall  . LUMBAR SPINE SURGERY     L4-5   Allergies  Allergen Reactions  . Simvastatin     myalgia  . Statins     Severe musculoskeletal pains.   Prior to Admission medications   Medication Sig Start Date End Date Taking? Authorizing Provider  aspirin 81 MG tablet Take 81 mg by mouth daily.   Yes [provider]  cholecalciferol (VITAMIN D) 1000 units  tablet Take 2,000 Units by mouth daily.   Yes [provider]  fluticasone (FLONASE) 50 MCG/ACT nasal spray SHAKE LIQUID AND USE 2 SPRAYS IN EACH NOSTRIL DAILY 11/10/17  Yes [name], MD  hydrochlorothiazide (HYDRODIURIL) 25 MG tablet TAKE 1 TABLET BY MOUTH EVERY DAY 01/09/18  Yes [name], MD  metFORMIN (GLUCOPHAGE) 1000 MG tablet TAKE 1 TABLET(1000 MG) BY MOUTH TWICE DAILY WITH A MEAL 01/08/18  Yes [name], MD  Multiple Vitamin (MULTIVITAMIN) tablet Take 1 tablet by mouth daily.   Yes [provider]  niacin (NIASPAN) 750 MG CR tablet Take 2 tablets (1,500 mg total) by mouth at bedtime. 07/10/17  Yes [name], MD   Social History   Socioeconomic History  . Marital status: Married    Spouse name: Gerald Stabs  . Number of children: 1  . Years of education: 68  . Highest education level: Not on file  Occupational History  . Occupation: Control and instrumentation engineer  Social Needs  . Financial resource strain: Not on file  . Food insecurity:    Worry: Not on file    Inability: Not on file  . Transportation needs:    Medical: Not on file    Non-medical: Not on file  Tobacco Use  . Smoking status: Never Smoker  . Smokeless tobacco: Never Used  Substance and Sexual Activity  . Alcohol use: Yes    Alcohol/week: 4.0 standard drinks    Types: 4 Glasses of wine per week    Comment: 4 glasses wine a week   . Drug use: No  . Sexual activity: Yes    Partners: Male    Birth control/protection: Condom  Lifestyle  . Physical activity:    Days per week: Not on file    Minutes per session: Not on file  . Stress: Not on file  Relationships  . Social connections:    Talks on phone: Not on file    Gets together: Not on file    Attends religious service: Not on file    Active member of club or organization: Not on file    Attends meetings of clubs or organizations: Not on file    Relationship status: Not on file  . Intimate partner violence:    Fear of  current or ex partner: Not on file    Emotionally abused: Not on file    Physically abused: Not on file    Forced sexual activity: Not on file  Other Topics Concern  . Not on file  Social History Narrative   Lives with her husband (a Software engineer) and their daughter.  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  Negative other than as indicated above or in HPI.      Objective:   Physical Exam Vitals signs reviewed. Exam conducted with a chaperone present.  Constitutional:      Appearance: She is well-developed.  HENT:     Head: Normocephalic and atraumatic.     Right Ear: External ear normal.     Left Ear: External ear normal.  Eyes:     Conjunctiva/sclera: Conjunctivae normal.     Pupils: Pupils are equal, round, and reactive to light.  Neck:     Musculoskeletal: Normal range of motion and neck supple.     Thyroid: No thyromegaly.  Cardiovascular:     Rate and Rhythm: Normal rate and regular rhythm.     Heart sounds: Normal heart sounds. No murmur.  Pulmonary:     Effort: Pulmonary effort is normal. No respiratory distress.     Breath sounds: Normal breath sounds. No wheezing.  Abdominal:     General: Bowel sounds are normal.     Palpations: Abdomen is soft.     Tenderness: There is no abdominal tenderness.  Genitourinary:    General: Normal vulva.     Exam position: Supine.     Pubic Area: No rash.      Labia:        Right: No rash or lesion.        Left: No rash or lesion.      Cervix: Lesion (possible polyp at 6-9 oclock. bleeding/friable with pap testing. ) and cervical bleeding present. No cervical motion tenderness or discharge.     Uterus: Normal.      Adnexa: Right adnexa normal and left adnexa normal.  Musculoskeletal: Normal range of motion.        General: No tenderness.  Lymphadenopathy:     Cervical: No cervical adenopathy.  Skin:     General: Skin is warm and dry.     Findings: No rash.  Neurological:     Mental Status: She is alert and oriented to person, place, and time.  Psychiatric:        Behavior: Behavior normal.        Thought Content: Thought content normal.       Assessment & Plan:   Stephanie Nunez is a 58 y.o. female Annual physical exam - Plan: Pap IG w/ reflex to HPV when ASC-U  - -anticipatory guidance as below in AVS, screening labs above. Health maintenance items as above in HPI discussed/recommended as applicable.   Hyperlipidemia, unspecified hyperlipidemia type - Plan: Lipid panel, Comprehensive metabolic panel  -With history of diabetes based on prior LDL will try at least low-dose statin or intermittent statin dosing, and can combine with co-Q10 to see if that lessens arthralgias/myalgias.  Initially recommended Lipitor once per week, then increase up to every other day as tolerated.  Type 2 diabetes mellitus without complication, unspecified whether long term insulin use (HCC) - Plan: Hemoglobin A1c, Microalbumin / creatinine urine ratio, metFORMIN (GLUCOPHAGE) 1000 MG tablet  -Stable previously, check A1c, microalbumin/creatinine ratio, continue same dose of metformin.  Cervical polyp - Plan: Ambulatory referral to Gynecology  -Possible cervical polyp on exam  However will refer her to gynecology to evaluate that lesion further and recommendations.  Pap testing performed.  Essential hypertension - Plan: hydrochlorothiazide (HYDRODIURIL) 25 MG tablet  -Borderline control.  Monitor at home and if running over 140/90 would add ACE inhibitor given history of diabetes.  Continue HCTZ same dose  for now.  Meds ordered this encounter  Medications  . atorvastatin (LIPITOR) 10 MG tablet    Sig: Take 1 tablet (10 mg total) by mouth every other day.    Dispense:  30 tablet    Refill:  1  . hydrochlorothiazide (HYDRODIURIL) 25 MG tablet    Sig: TAKE 1 TABLET BY MOUTH EVERY DAY    Dispense:  90  tablet    Refill:  2  . metFORMIN (GLUCOPHAGE) 1000 MG tablet    Sig: TAKE 1 TABLET(1000 MG) BY MOUTH TWICE DAILY WITH A MEAL    Dispense:  180 tablet    Refill:  2   Patient Instructions    For cholesterol, try statin once per week, then if tolerated can increase to uup to once every other day. If aches return, can discontinue meds. CoQ10 supplement may also help prevent aches.   Keep a record of your blood pressures outside of the office and if running over 140/90 - would recommend adding ACE inhibitor.   Shingrix at your pharmacy.   There appeared to be a possible cervical polyp on your exam today.  I will refer you to gynecology to evaluate that area further and other recommendations.  Let me know if there are questions.  Thanks for coming in today.   Keeping You Healthy  Get These Tests  Blood Pressure- Have your blood pressure checked by your healthcare provider at least once a year.  Normal blood pressure is 120/80.  Weight- Have your body mass index (BMI) calculated to screen for obesity.  BMI is a measure of body fat based on height and weight.  You can calculate your own BMI at [employer]  Cholesterol- Have your cholesterol checked every year.  Diabetes- Have your blood sugar checked every year if you have high blood pressure, high cholesterol, a family history of diabetes or if you are overweight.  Pap Test - Have a pap test every 1 to 5 years if you have been sexually active.  If you are older than 65 and recent pap tests have been normal you may not need additional pap tests.  In addition, if you have had a hysterectomy  for benign disease additional pap tests are not necessary.  Mammogram-Yearly mammograms are essential for early detection of breast cancer  Screening for Colon Cancer- Colonoscopy starting at age 42. Screening may begin sooner depending on your family history and other health conditions.  Follow up colonoscopy as directed by your  Gastroenterologist.  Screening for Osteoporosis- Screening begins at age 42 with bone density scanning, sooner if you are at higher risk for developing Osteoporosis.  Get these medicines  Calcium with Vitamin D- Your body requires 1200-1500 mg of Calcium a day and [phone] IU of Vitamin D a day.  You can only absorb 500 mg of Calcium at a time therefore Calcium must be taken in 2 or 3 separate doses throughout the day.  Hormones- Hormone therapy has been associated with increased risk for certain cancers and heart disease.  Talk to your healthcare provider about if you need relief from menopausal symptoms.  Aspirin- Ask your healthcare provider about taking Aspirin to prevent Heart Disease and Stroke.  Get these Immuniztions  Flu shot- Every fall  Pneumonia shot- Once after the age of 80; if you are younger ask your healthcare provider if you need a pneumonia shot.  Tetanus- Every ten years.  Zostavax- Once after the age of 49 to prevent shingles.  Take these steps  Don't smoke- Your healthcare provider can help you quit. For tips on how to quit, ask your healthcare provider or go to www.smokefree.gov or call 1-800 QUIT-NOW.  Be physically active- Exercise 5 days a week for a minimum of 30 minutes.  If you are not already physically active, start slow and gradually work up to 30 minutes of moderate physical activity.  Try walking, dancing, bike riding, swimming, etc.  Eat a healthy diet- Eat a variety of healthy foods such as fruits, vegetables, whole grains, low fat milk, low fat cheeses, yogurt, lean meats, chicken, fish, eggs, dried beans, tofu, etc.  For more information go to www.thenutritionsource.org  Dental visit- Brush and floss teeth twice daily; visit your dentist twice a year.  Eye exam- Visit your Optometrist or Ophthalmologist yearly.  Drink alcohol in moderation- Limit alcohol intake to one drink or less a day.  Never drink and drive.  Depression- Your emotional  health is as important as your physical health.  If you're feeling down or losing interest in things you normally enjoy, please talk to your healthcare provider.  Seat Belts- can save your life; always wear one  Smoke/Carbon Monoxide detectors- These detectors need to be installed on the appropriate level of your home.  Replace batteries at least once a year.  Violence- If anyone is threatening or hurting you, please tell your healthcare provider.  Living Will/ Health care power of attorney- Discuss with your healthcare provider and family.  If you have lab work done today you will be contacted with your lab results within the next 2 weeks.  If you have not heard from [place] then please contact us. The fastest way to get your results is to register for My Chart.   IF you received an x-ray today, you will receive an invoice from [facility]. Please contact [facility] at [phone] with questions or concerns regarding your invoice.   IF you received labwork today, you will receive an invoice from [place]. Please contact [facility] at [phone] with questions or concerns regarding your invoice.   Our billing staff will not be able to assist you with questions regarding bills from these companies.  You will be contacted with the lab results as soon as they are available. The fastest way to get your results is to activate your My Chart account. Instructions are located on the last page of this paperwork. If you have not heard from [place] regarding the results in 2 weeks, please contact this office.       Signed,   Merri Ray, MD [facility].  02/11/18 8:57 AM

## 2018-02-11 NOTE — Patient Instructions (Addendum)
For cholesterol, try statin once per week, then if tolerated can increase to uup to once every other day. If aches return, can discontinue meds. CoQ10 supplement may also help prevent aches.   Keep a record of your blood pressures outside of the office and if running over 140/90 - would recommend adding ACE inhibitor.   Shingrix at your pharmacy.   There appeared to be a possible cervical polyp on your exam today.  I will refer you to gynecology to evaluate that area further and other recommendations.  Let me know if there are questions.  Thanks for coming in today.   Keeping You Healthy  Get These Tests  Blood Pressure- Have your blood pressure checked by your healthcare provider at least once a year.  Normal blood pressure is 120/80.  Weight- Have your body mass index (BMI) calculated to screen for obesity.  BMI is a measure of body fat based on height and weight.  You can calculate your own BMI at [employer]  Cholesterol- Have your cholesterol checked every year.  Diabetes- Have your blood sugar checked every year if you have high blood pressure, high cholesterol, a family history of diabetes or if you are overweight.  Pap Test - Have a pap test every 1 to 5 years if you have been sexually active.  If you are older than 65 and recent pap tests have been normal you may not need additional pap tests.  In addition, if you have had a hysterectomy  for benign disease additional pap tests are not necessary.  Mammogram-Yearly mammograms are essential for early detection of breast cancer  Screening for Colon Cancer- Colonoscopy starting at age 7. Screening may begin sooner depending on your family history and other health conditions.  Follow up colonoscopy as directed by your Gastroenterologist.  Screening for Osteoporosis- Screening begins at age 2 with bone density scanning, sooner if you are at higher risk for developing Osteoporosis.  Get these medicines  Calcium  with Vitamin D- Your body requires 1200-1500 mg of Calcium a day and [phone] IU of Vitamin D a day.  You can only absorb 500 mg of Calcium at a time therefore Calcium must be taken in 2 or 3 separate doses throughout the day.  Hormones- Hormone therapy has been associated with increased risk for certain cancers and heart disease.  Talk to your healthcare provider about if you need relief from menopausal symptoms.  Aspirin- Ask your healthcare provider about taking Aspirin to prevent Heart Disease and Stroke.  Get these Immuniztions  Flu shot- Every fall  Pneumonia shot- Once after the age of 85; if you are younger ask your healthcare provider if you need a pneumonia shot.  Tetanus- Every ten years.  Zostavax- Once after the age of 59 to prevent shingles.  Take these steps  Don't smoke- Your healthcare provider can help you quit. For tips on how to quit, ask your healthcare provider or go to www.smokefree.gov or call 1-800 QUIT-NOW.  Be physically active- Exercise 5 days a week for a minimum of 30 minutes.  If you are not already physically active, start slow and gradually work up to 30 minutes of moderate physical activity.  Try walking, dancing, bike riding, swimming, etc.  Eat a healthy diet- Eat a variety of healthy foods such as fruits, vegetables, whole grains, low fat milk, low fat cheeses, yogurt, lean meats, chicken, fish, eggs, dried beans, tofu, etc.  For more information go to www.thenutritionsource.org  Dental visit- Brush and floss  teeth twice daily; visit your dentist twice a year.  Eye exam- Visit your Optometrist or Ophthalmologist yearly.  Drink alcohol in moderation- Limit alcohol intake to one drink or less a day.  Never drink and drive.  Depression- Your emotional health is as important as your physical health.  If you're feeling down or losing interest in things you normally enjoy, please talk to your healthcare provider.  Seat Belts- can save your life; always  wear one  Smoke/Carbon Monoxide detectors- These detectors need to be installed on the appropriate level of your home.  Replace batteries at least once a year.  Violence- If anyone is threatening or hurting you, please tell your healthcare provider.  Living Will/ Health care power of attorney- Discuss with your healthcare provider and family.  If you have lab work done today you will be contacted with your lab results within the next 2 weeks.  If you have not heard from [place] then please contact us. The fastest way to get your results is to register for My Chart.   IF you received an x-ray today, you will receive an invoice from [facility]. Please contact [facility] at [phone] with questions or concerns regarding your invoice.   IF you received labwork today, you will receive an invoice from [place]. Please contact [facility] at [phone] with questions or concerns regarding your invoice.   Our billing staff will not be able to assist you with questions regarding bills from these companies.  You will be contacted with the lab results as soon as they are available. The fastest way to get your results is to activate your My Chart account. Instructions are located on the last page of this paperwork. If you have not heard from [place] regarding the results in 2 weeks, please contact this office.

## 2018-02-12 LAB — MICROALBUMIN / CREATININE URINE RATIO
CREATININE, UR: 24 mg/dL
Microalb/Creat Ratio: 42 mg/g creat — ABNORMAL HIGH (ref 0–29)
Microalbumin, Urine: 10 ug/mL

## 2018-02-14 LAB — PAP IG W/ RFLX HPV ASCU

## 2018-02-27 ENCOUNTER — Other Ambulatory Visit: Payer: Self-pay | Admitting: Family Medicine

## 2018-02-27 DIAGNOSIS — E785 Hyperlipidemia, unspecified: Secondary | ICD-10-CM

## 2018-03-16 ENCOUNTER — Other Ambulatory Visit: Payer: Self-pay | Admitting: Family Medicine

## 2018-03-16 DIAGNOSIS — J302 Other seasonal allergic rhinitis: Secondary | ICD-10-CM

## 2018-04-10 ENCOUNTER — Ambulatory Visit: Payer: BC Managed Care – PPO | Admitting: Gynecology

## 2018-06-11 ENCOUNTER — Other Ambulatory Visit: Payer: Self-pay | Admitting: Family Medicine

## 2018-06-29 ENCOUNTER — Ambulatory Visit: Payer: BC Managed Care – PPO | Admitting: Gynecology

## 2018-07-18 ENCOUNTER — Other Ambulatory Visit: Payer: Self-pay | Admitting: Family Medicine

## 2018-07-18 DIAGNOSIS — E785 Hyperlipidemia, unspecified: Secondary | ICD-10-CM

## 2018-07-18 NOTE — Telephone Encounter (Signed)
Forwarding medication refill to PCP for review. 

## 2018-08-08 ENCOUNTER — Other Ambulatory Visit: Payer: Self-pay | Admitting: Family Medicine

## 2018-08-08 NOTE — Telephone Encounter (Signed)
Forwarding medication refill request to PCP for review. 

## 2018-08-24 ENCOUNTER — Ambulatory Visit: Payer: BC Managed Care – PPO | Admitting: Family Medicine

## 2018-10-13 ENCOUNTER — Encounter: Payer: Self-pay | Admitting: Gynecology

## 2018-10-15 ENCOUNTER — Other Ambulatory Visit: Payer: Self-pay | Admitting: Family Medicine

## 2018-10-15 DIAGNOSIS — E785 Hyperlipidemia, unspecified: Secondary | ICD-10-CM

## 2018-10-15 NOTE — Telephone Encounter (Signed)
Requested medication (s) are due for refill today: yes  Requested medication (s) are on the active medication list: yes  Last refill:  07/20/2018  Future visit scheduled: no  Notes to clinic:  Review for refill   Requested Prescriptions  Pending Prescriptions Disp Refills   niacin (NIASPAN) 750 MG CR tablet [Pharmacy Med Name: NIACIN 750MG  ER TABLETS] 180 tablet 0    Sig: TAKE 2 TABLETS(1500 MG) BY MOUTH AT BEDTIME     Cardiovascular:  Antilipid - niacin Failed - 10/15/2018  3:12 AM      Failed - Total Cholesterol in normal range and within 360 days    Cholesterol, Total  Date Value Ref Range Status  02/11/2018 237 (H) 100 - 199 mg/dL Final         Failed - LDL in normal range and within 360 days    LDL Calculated  Date Value Ref Range Status  02/11/2018 158 (H) 0 - 99 mg/dL Final         Passed - AST in normal range and within 360 days    AST  Date Value Ref Range Status  02/11/2018 20 0 - 40 IU/L Final         Passed - ALT in normal range and within 360 days    ALT  Date Value Ref Range Status  02/11/2018 17 0 - 32 IU/L Final         Passed - HDL in normal range and within 360 days    HDL  Date Value Ref Range Status  02/11/2018 58 >39 mg/dL Final         Passed - Triglycerides in normal range and within 360 days    Triglycerides  Date Value Ref Range Status  02/11/2018 107 0 - 149 mg/dL Final         Passed - Valid encounter within last 12 months    Recent Outpatient Visits          8 months ago Annual physical exam   [facility], Ranell Patrick, MD   1 year ago Hyperlipidemia, unspecified hyperlipidemia type   [facility], Ranell Patrick, MD   1 year ago Left shoulder pain, unspecified chronicity   [facility] [place], [place]   2 years ago Annual physical exam   [facility], Ranell Patrick, MD   3 years ago Type 2 diabetes mellitus without complication, without long-term current use of insulin  [facility])   [facility], Damaris Hippo, PA-C

## 2018-10-31 ENCOUNTER — Other Ambulatory Visit: Payer: Self-pay | Admitting: Family Medicine

## 2018-10-31 DIAGNOSIS — I10 Essential (primary) hypertension: Secondary | ICD-10-CM

## 2018-10-31 NOTE — Telephone Encounter (Signed)
Requested Prescriptions  Pending Prescriptions Disp Refills  . hydrochlorothiazide (HYDRODIURIL) 25 MG tablet [Pharmacy Med Name: HYDROCHLOROTHIAZIDE 25MG  TABLETS] 90 tablet 2    Sig: TAKE 1 TABLET BY MOUTH EVERY DAY     Cardiovascular: Diuretics - Thiazide Failed - 10/31/2018  9:15 AM      Failed - Valid encounter within last 6 months    Recent Outpatient Visits          8 months ago Annual physical exam   [facility], Ranell Patrick, MD   1 year ago Hyperlipidemia, unspecified hyperlipidemia type   [facility], Ranell Patrick, MD   1 year ago Left shoulder pain, unspecified chronicity   Primary Care at [facility], [place], [place]   2 years ago Annual physical exam   [facility], Ranell Patrick, MD   3 years ago Type 2 diabetes mellitus without complication, without long-term current use of insulin [facility])   [facility], Damaris Hippo, PA-C             Passed - Ca in normal range and within 360 days    Calcium  Date Value Ref Range Status  02/11/2018 9.5 8.7 - 10.2 mg/dL Final         Passed - Cr in normal range and within 360 days    Creat  Date Value Ref Range Status  09/09/2015 0.64 0.50 - 1.05 mg/dL Final    Comment:      For patients > or = 57 years of age: The upper reference limit for Creatinine is approximately 13% higher for people identified as African-American.      Creatinine, Ser  Date Value Ref Range Status  02/11/2018 0.60 0.57 - 1.00 mg/dL Final         Passed - K in normal range and within 360 days    Potassium  Date Value Ref Range Status  02/11/2018 3.5 3.5 - 5.2 mmol/L Final         Passed - Na in normal range and within 360 days    Sodium  Date Value Ref Range Status  02/11/2018 138 134 - 144 mmol/L Final         Passed - Last BP in normal range    BP Readings from Last 1 Encounters:  02/11/18 138/80

## 2018-12-09 ENCOUNTER — Other Ambulatory Visit: Payer: Self-pay

## 2018-12-09 ENCOUNTER — Ambulatory Visit: Payer: BC Managed Care – PPO | Admitting: Family Medicine

## 2018-12-09 ENCOUNTER — Encounter: Payer: Self-pay | Admitting: Family Medicine

## 2018-12-09 VITALS — BP 140/80 | HR 83 | Temp 98.4°F | Wt 188.6 lb

## 2018-12-09 DIAGNOSIS — J302 Other seasonal allergic rhinitis: Secondary | ICD-10-CM | POA: Diagnosis not present

## 2018-12-09 DIAGNOSIS — M2550 Pain in unspecified joint: Secondary | ICD-10-CM

## 2018-12-09 DIAGNOSIS — I10 Essential (primary) hypertension: Secondary | ICD-10-CM

## 2018-12-09 DIAGNOSIS — R809 Proteinuria, unspecified: Secondary | ICD-10-CM

## 2018-12-09 DIAGNOSIS — R202 Paresthesia of skin: Secondary | ICD-10-CM

## 2018-12-09 DIAGNOSIS — E785 Hyperlipidemia, unspecified: Secondary | ICD-10-CM

## 2018-12-09 DIAGNOSIS — E1129 Type 2 diabetes mellitus with other diabetic kidney complication: Secondary | ICD-10-CM

## 2018-12-09 MED ORDER — HYDROCHLOROTHIAZIDE 25 MG PO TABS: 25.0000 mg | ORAL_TABLET | Freq: Every day | ORAL | 1 refills | Status: DC

## 2018-12-09 MED ORDER — GABAPENTIN 100 MG PO CAPS: 100.0000 mg | ORAL_CAPSULE | Freq: Every evening | ORAL | 1 refills | Status: DC

## 2018-12-09 MED ORDER — FLUTICASONE PROPIONATE 50 MCG/ACT NA SUSP: 1.0000 | Freq: Every day | NASAL | 5 refills | Status: DC

## 2018-12-09 MED ORDER — NIACIN ER (ANTIHYPERLIPIDEMIC) 750 MG PO TBCR: EXTENDED_RELEASE_TABLET | ORAL | 1 refills | Status: DC

## 2018-12-09 MED ORDER — METFORMIN HCL 1000 MG PO TABS: ORAL_TABLET | ORAL | 2 refills | Status: DC

## 2018-12-09 MED ORDER — LISINOPRIL 10 MG PO TABS: 10.0000 mg | ORAL_TABLET | Freq: Every day | ORAL | 1 refills | Status: DC

## 2018-12-09 NOTE — Patient Instructions (Addendum)
   If continued muscle aches, can try decreasing dose or holding niacin temporarily. Continue lipitor for now.   Foot issue could be due to diabetic neuropathy. Try gabapentin once per night (I wrote lowest dose). Increase by one pill every week if needed.   Start lisinopril for blood pressure. Continue hydrochlorothiazide as well.   Recheck in 3 months.  Thanks for coming in today and take care.   If you have lab work done today you will be contacted with your lab results within the next 2 weeks.  If you have not heard from [place] then please contact us. The fastest way to get your results is to register for My Chart.   IF you received an x-ray today, you will receive an invoice from [facility]. Please contact [facility] at [phone] with questions or concerns regarding your invoice.   IF you received labwork today, you will receive an invoice from [facility]. Please contact [facility] at [phone] with questions or concerns regarding your invoice.   Our billing staff will not be able to assist you with questions regarding bills from these companies.  You will be contacted with the lab results as soon as they are available. The fastest way to get your results is to activate your My Chart account. Instructions are located on the last page of this paperwork. If you have not heard from [place] regarding the results in 2 weeks, please contact this office.

## 2018-12-09 NOTE — Progress Notes (Signed)
Subjective:  Patient ID: Stephanie Nunez, female    DOB: [birth year]  Age: 58 y.o. MRN: [id number]  CC:  Chief Complaint  Patient presents with  . Hypertension    f/u on bp. Patient stated she is not having the floating things happen any more.  . numbness and tingling    in feet and leg been going on for a while but worse in the past few months    HPI Stephanie Nunez presents for  Hypertension: Borderline in January.  Option of adding ACE inhibitor discussed with history of diabetes.  Continued on hydrochlorothiazide 25 mg at that time. Took lisinopril in past without difficulty.  Home readings: no recent readings.  BP Readings from Last 3 Encounters:  12/09/18 140/80  02/11/18 138/80  07/10/17 122/90   Lab Results  Component Value Date   CREATININE 0.60 02/11/2018   Diabetes: Associated with microalbuminuria with slight elevated ratio in January. Metformin 1000 mg twice daily. Not on ACE/ARB but does take statin Microalbumin: Ratio 42 on January 29. No home readings, no new side effects with metformin.  Optho, foot exam, pneumovax:  Ophthalmology: 12/4 scheduled.  Otherwise up-to-date  Dysesthesias in feet only - comes and goes, left greater than right. Improves with stretching. more noticeable past few months at night. burning feeling, moving up ankle/lower leg, not from back.  Lab Results  Component Value Date   WBC 3.8 09/21/2016   HGB 13.8 09/21/2016   HCT 40.2 09/21/2016   MCV 93 09/21/2016   PLT 376 09/21/2016    Lab Results  Component Value Date   HGBA1C 6.7 (H) 02/11/2018   HGBA1C 6.7 (H) 07/10/2017   HGBA1C 6.9 (H) 09/21/2016   Lab Results  Component Value Date   MICROALBUR 1.0 09/09/2015   LDLCALC 158 (H) 02/11/2018   CREATININE 0.60 02/11/2018   Hyperlipidemia:  Some difficulty with statin in the past.  Elevated LDL last visit with diabetes.  Initially recommended Lipitor once per week can increase up to every other day as tolerated with co-Q10  supplement.  Currently taking lipitor every other day -some myalgia, but bearable. Still taking Niaspan 1500 mg at bedtime.   Lab Results  Component Value Date   CHOL 237 (H) 02/11/2018   HDL 58 02/11/2018   LDLCALC 158 (H) 02/11/2018   TRIG 107 02/11/2018   CHOLHDL 4.1 02/11/2018   Lab Results  Component Value Date   ALT 17 02/11/2018   AST 20 02/11/2018   ALKPHOS 66 02/11/2018   BILITOT 0.7 02/11/2018    History Patient Active Problem List   Diagnosis Date Noted  . Epiphora 01/05/2016  . HTN (hypertension) 02/01/2011  . Hyperlipemia 02/01/2011  . Obesity (BMI 30-39.9) 02/01/2011  . DM type 2 (diabetes mellitus, type 2) ([place]) 02/01/2011   Past Medical History:  Diagnosis Date  . Allergy   . Anemia   . Diabetes type 2, controlled ([place])   . Gestational diabetes mellitus   . Hyperlipidemia    no meds  . Hypertension   . Uterine fibroid    Past Surgical History:  Procedure Laterality Date  . BREAST SURGERY     fatty tumor under left breast  . LIPOMA EXCISION     Left chest wall  . LUMBAR SPINE SURGERY     L4-5   Allergies  Allergen Reactions  . Simvastatin     myalgia  . Statins     Severe musculoskeletal pains.   Prior to Admission medications  Medication Sig Start Date End Date Taking? Authorizing Provider  aspirin 81 MG tablet Take 81 mg by mouth daily.    [provider]  atorvastatin (LIPITOR) 10 MG tablet TAKE 1 TABLET(10 MG) BY MOUTH EVERY OTHER DAY 08/09/18   [name], MD  cholecalciferol (VITAMIN D) 1000 units tablet Take 2,000 Units by mouth daily.    [provider]  fluticasone (FLONASE) 50 MCG/ACT nasal spray SHAKE LIQUID AND USE 2 SPRAYS IN EACH NOSTRIL DAILY 03/16/18   [name], MD  hydrochlorothiazide (HYDRODIURIL) 25 MG tablet TAKE 1 TABLET BY MOUTH EVERY DAY 10/31/18   [name], MD  metFORMIN (GLUCOPHAGE) 1000 MG tablet TAKE 1 TABLET(1000 MG) BY MOUTH TWICE DAILY WITH A MEAL 02/11/18   [name], MD  Multiple Vitamin (MULTIVITAMIN) tablet Take 1 tablet by mouth daily.    [provider]  niacin (NIASPAN) 750 MG CR tablet TAKE 2 TABLETS(1500 MG) BY MOUTH AT BEDTIME 10/15/18   [name], MD  atorvastatin (LIPITOR) 10 MG tablet TAKE 1 TABLET(10 MG) BY MOUTH EVERY OTHER DAY 06/11/18   [name], MD   Social History   Socioeconomic History  . Marital status: Married    Spouse name: Gerald Stabs  . Number of children: 1  . Years of education: 69  . Highest education level: Not on file  Occupational History  . Occupation: Control and instrumentation engineer  Social Needs  . Financial resource strain: Not on file  . Food insecurity    Worry: Not on file    Inability: Not on file  . Transportation needs    Medical: Not on file    Non-medical: Not on file  Tobacco Use  . Smoking status: Never Smoker  . Smokeless tobacco: Never Used  Substance and Sexual Activity  . Alcohol use: Yes    Alcohol/week: 4.0 standard drinks    Types: 4 Glasses of wine per week    Comment: 4 glasses wine a week   . Drug use: No  . Sexual activity: Yes    Partners: Male    Birth control/protection: Condom  Lifestyle  . Physical activity    Days per week: Not on file    Minutes per session: Not on file  . Stress: Not on file  Relationships  . Social Herbalist on phone: Not on file    Gets together: Not on file    Attends religious service: Not on file    Active member of club or organization: Not on file    Attends meetings of clubs or organizations: Not on file    Relationship status: Not on file  . Intimate partner violence    Fear of current or ex partner: Not on file    Emotionally abused: Not on file    Physically abused: Not on file    Forced sexual activity: Not on file  Other Topics Concern  . Not on file  Social History Narrative   Lives with her husband (a Software engineer) and their daughter.  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  Respiratory: Negative for chest tightness and shortness of breath.   Cardiovascular: Negative for chest pain, palpitations and leg swelling.  Gastrointestinal: Negative for abdominal pain and blood in stool.  Neurological: Negative for dizziness, syncope, light-headedness and headaches.     Objective:  Vitals:   12/09/18 0850  BP: 140/80  Pulse: 83  Temp: 98.4 F (36.9 C)  TempSrc: Oral  SpO2: 97%  Weight: 188 lb 9.6 oz (85.5 kg)     Physical Exam Constitutional:      Appearance: Normal appearance. She is well-developed.  HENT:     Head: Normocephalic and atraumatic.  Eyes:     Conjunctiva/sclera: Conjunctivae normal.     Pupils: Pupils are equal, round, and reactive to light.  Neck:     Musculoskeletal: Normal range of motion.     Thyroid: No thyromegaly.  Cardiovascular:     Rate and Rhythm: Normal rate and regular rhythm.     Heart sounds: Normal heart sounds. No murmur.  Pulmonary:     Effort: Pulmonary effort is normal.     Breath sounds: No wheezing or rales.  Abdominal:     General: There is no abdominal bruit.     Palpations: Abdomen is soft. There is no pulsatile mass.     Tenderness: There is no abdominal tenderness.  Skin:    General: Skin is warm and dry.  Neurological:     Mental Status: She is alert and oriented to person, place, and time.     Comments: Microfilament testing WNL bilat feet  Psychiatric:        Behavior: Behavior normal.        Assessment & Plan:  Stephanie Nunez is a 58 y.o. female . Tingling of both feet - Plan: Hemoglobin A1c, gabapentin (NEURONTIN) 100 MG capsule  -Possible diabetic neuropathy, trial of low-dose gabapentin with option of increasing dose every week by 1 pill.  Recheck 3 months  Other seasonal allergic rhinitis - Plan: fluticasone (FLONASE) 50 MCG/ACT nasal spray  Hyperlipidemia, unspecified hyperlipidemia type -  Plan: niacin (NIASPAN) 750 MG CR tablet, Comprehensive metabolic panel, Lipid Panel Arthralgia, unspecified joint - Plan: CK  -Check CPK with episodic arthralgias, continue Lipitor every other day.  Option of decreasing Niaspan to 1/day or off as increased risk of myopathy.  Essential hypertension - Plan: Comprehensive metabolic panel, lisinopril (ZESTRIL) 10 MG tablet, hydrochlorothiazide (HYDRODIURIL) 25 MG tablet  -Add lisinopril 10 mg daily for improved control as well as microalbuminuria  Type 2 diabetes mellitus with microalbuminuria, without long-term current use of insulin (HCC) - Plan: Hemoglobin A1c, metFORMIN (GLUCOPHAGE) 1000 MG tablet  -Tolerating Metformin, check labs above.  Gabapentin for probable neuropathy, start ACE for microalbuminuria.    No orders of the defined types were placed in this encounter.  Patient Instructions     If continued muscle aches, can try decreasing dose or holding niacin temporarily. Continue lipitor for now.   Foot issue could be due to diabetic neuropathy. Try gabapentin once per night (I wrote lowest dose). Increase by one pill every week if needed.   Start lisinopril for blood pressure. Continue hydrochlorothiazide as well.   Recheck in 3 months.  Thanks for coming in today and take care.   If you have lab work done today you will be contacted with your lab results within the next 2 weeks.  If you have not heard from [place] then please contact us. The fastest way to get your results is to register for My Chart.   IF you received an x-ray today, you will receive an invoice from [facility]. Please contact [facility] at [phone] with questions or concerns regarding your invoice.   IF you received labwork today, you will receive an invoice from [place] [facility]. Please contact  [facility] at [phone] with questions or concerns regarding your invoice.   Our billing staff will not be able to assist you with questions regarding  bills from these companies.  You will be contacted with the lab results as soon as they are available. The fastest way to get your results is to activate your My Chart account. Instructions are located on the last page of this paperwork. If you have not heard from [place] regarding the results in 2 weeks, please contact this office.          Signed, Merri Ray, MD [facility]

## 2018-12-10 LAB — COMPREHENSIVE METABOLIC PANEL
ALT: 16 IU/L (ref 0–32)
AST: 21 IU/L (ref 0–40)
Albumin/Globulin Ratio: 1.9 (ref 1.2–2.2)
Albumin: 4.6 g/dL (ref 3.8–4.9)
Alkaline Phosphatase: 77 IU/L (ref 39–117)
BUN/Creatinine Ratio: 13 (ref 9–23)
BUN: 8 mg/dL (ref 6–24)
Bilirubin Total: 0.7 mg/dL (ref 0.0–1.2)
CO2: 25 mmol/L (ref 20–29)
Calcium: 9.3 mg/dL (ref 8.7–10.2)
Chloride: 97 mmol/L (ref 96–106)
Creatinine, Ser: 0.6 mg/dL (ref 0.57–1.00)
GFR calc Af Amer: 117 mL/min/{1.73_m2} (ref >59)
GFR calc non Af Amer: 102 mL/min/{1.73_m2} (ref >59)
Globulin, Total: 2.4 g/dL (ref 1.5–4.5)
Glucose: 132 mg/dL — ABNORMAL HIGH (ref 65–99)
Potassium: 3.9 mmol/L (ref 3.5–5.2)
Sodium: 140 mmol/L (ref 134–144)
Total Protein: 7 g/dL (ref 6.0–8.5)

## 2018-12-10 LAB — CK: Total CK: 127 U/L (ref 32–182)

## 2018-12-10 LAB — LIPID PANEL
Chol/HDL Ratio: 3.6 ratio (ref 0.0–4.4)
Cholesterol, Total: 197 mg/dL (ref 100–199)
HDL: 55 mg/dL (ref >39)
LDL Chol Calc (NIH): 130 mg/dL — ABNORMAL HIGH (ref 0–99)
Triglycerides: 65 mg/dL (ref 0–149)
VLDL Cholesterol Cal: 12 mg/dL (ref 5–40)

## 2018-12-10 LAB — HEMOGLOBIN A1C
Est. average glucose Bld gHb Est-mCnc: 171 mg/dL
Hgb A1c MFr Bld: 7.6 % — ABNORMAL HIGH (ref 4.8–5.6)

## 2019-01-12 ENCOUNTER — Other Ambulatory Visit: Payer: Self-pay | Admitting: Family Medicine

## 2019-01-12 DIAGNOSIS — J302 Other seasonal allergic rhinitis: Secondary | ICD-10-CM

## 2019-02-03 ENCOUNTER — Telehealth: Payer: Self-pay | Admitting: Family Medicine

## 2019-02-03 ENCOUNTER — Other Ambulatory Visit: Payer: Self-pay | Admitting: Family Medicine

## 2019-02-03 DIAGNOSIS — E785 Hyperlipidemia, unspecified: Secondary | ICD-10-CM

## 2019-02-03 DIAGNOSIS — E1129 Type 2 diabetes mellitus with other diabetic kidney complication: Secondary | ICD-10-CM

## 2019-02-03 MED ORDER — BLOOD GLUCOSE METER KIT: PACK | 0 refills | Status: AC

## 2019-02-03 MED ORDER — BLOOD GLUCOSE METER KIT: PACK | 0 refills | Status: DC

## 2019-02-03 NOTE — Telephone Encounter (Signed)
I have called the pt and informed her that the blood sugar meter has been sent to the pharmacy on [address]. Pt then requested that I send the rx to the [facility]. I have resent it to the updated pharmacy.

## 2019-02-03 NOTE — Telephone Encounter (Signed)
Pt is wanting Dr. Carlota Raspberry to write her a script for something to check her blood sugar every morning. She would like to know all the options, she has nothing specific in mind. She would like [place] to use  [facility], [address]  [place], [place] [address]  Phone:  [phone] Fax:  [phone]    Please advise at [phone]

## 2019-02-26 ENCOUNTER — Ambulatory Visit: Payer: BC Managed Care – PPO

## 2019-03-02 ENCOUNTER — Other Ambulatory Visit: Payer: Self-pay | Admitting: Family Medicine

## 2019-03-02 NOTE — Telephone Encounter (Signed)
Requested Prescriptions  Pending Prescriptions Disp Refills  . atorvastatin (LIPITOR) 10 MG tablet [Pharmacy Med Name: ATORVASTATIN 10MG  TABLETS] 30 tablet 0    Sig: TAKE 1 TABLET(10 MG) BY MOUTH EVERY OTHER DAY     Cardiovascular:  Antilipid - Statins Failed - 03/02/2019  3:14 AM      Failed - LDL in normal range and within 360 days    LDL Chol Calc (NIH)  Date Value Ref Range Status  12/09/2018 130 (H) 0 - 99 mg/dL Final         Passed - Total Cholesterol in normal range and within 360 days    Cholesterol, Total  Date Value Ref Range Status  12/09/2018 197 100 - 199 mg/dL Final         Passed - HDL in normal range and within 360 days    HDL  Date Value Ref Range Status  12/09/2018 55 >39 mg/dL Final         Passed - Triglycerides in normal range and within 360 days    Triglycerides  Date Value Ref Range Status  12/09/2018 65 0 - 149 mg/dL Final         Passed - Patient is not pregnant      Passed - Valid encounter within last 12 months    Recent Outpatient Visits          2 months ago Tingling of both feet   [facility], Ranell Patrick, MD   1 year ago Annual physical exam   Primary Care at [place], MD   1 year ago Hyperlipidemia, unspecified hyperlipidemia type   [facility] at [facility], Ranell Patrick, MD   2 years ago Left shoulder pain, unspecified chronicity   Primary Care at [facility], [place], [place]   2 years ago Annual physical exam   [facility] at [facility], Ranell Patrick, MD      Future Appointments            In 1 week Carlota Raspberry Ranell Patrick, MD Primary Care at [place], [facility]

## 2019-03-10 ENCOUNTER — Ambulatory Visit: Payer: BC Managed Care – PPO | Admitting: Family Medicine

## 2019-03-11 ENCOUNTER — Ambulatory Visit: Payer: BC Managed Care – PPO | Admitting: Family Medicine

## 2019-03-13 ENCOUNTER — Other Ambulatory Visit: Payer: Self-pay | Admitting: Family Medicine

## 2019-03-13 DIAGNOSIS — R202 Paresthesia of skin: Secondary | ICD-10-CM

## 2019-03-13 DIAGNOSIS — I10 Essential (primary) hypertension: Secondary | ICD-10-CM

## 2019-03-13 DIAGNOSIS — E1129 Type 2 diabetes mellitus with other diabetic kidney complication: Secondary | ICD-10-CM

## 2019-03-13 NOTE — Telephone Encounter (Signed)
Requested Prescriptions  Pending Prescriptions Disp Refills  . gabapentin (NEURONTIN) 100 MG capsule [Pharmacy Med Name: GABAPENTIN 100MG CAPSULES] 90 capsule 0    Sig: TAKE 1 CAPSULE(100 MG) BY MOUTH EVERY EVENING     Neurology: Anticonvulsants - gabapentin Passed - 03/13/2019  3:32 AM      Passed - Valid encounter within last 12 months    Recent Outpatient Visits          3 months ago Tingling of both feet   [facility], Ranell Patrick, MD   1 year ago Annual physical exam   [facility] at [facility], Ranell Patrick, MD   1 year ago Hyperlipidemia, unspecified hyperlipidemia type   [facility] at [facility], Ranell Patrick, MD   2 years ago Left shoulder pain, unspecified chronicity   Primary Care at [facility], [place], [place]   2 years ago Annual physical exam   [facility] at [facility], Ranell Patrick, MD      Future Appointments            In 4 days [name], MD Primary Care at [place], [facility]           . metFORMIN (GLUCOPHAGE) 1000 MG tablet [Pharmacy Med Name: METFORMIN 1000MG TABLETS] 180 tablet 2    Sig: TAKE 1 TABLET(1000 MG) BY MOUTH TWICE DAILY WITH A MEAL     Endocrinology:  Diabetes - Biguanides Passed - 03/13/2019  3:32 AM      Passed - Cr in normal range and within 360 days    Creat  Date Value Ref Range Status  09/09/2015 0.64 0.50 - 1.05 mg/dL Final    Comment:      For patients > or = 58 years of age: The upper reference limit for Creatinine is approximately 13% higher for people identified as African-American.      Creatinine, Ser  Date Value Ref Range Status  12/09/2018 0.60 0.57 - 1.00 mg/dL Final         Passed - HBA1C is between 0 and 7.9 and within 180 days    Hgb A1c MFr Bld  Date Value Ref Range Status  12/09/2018 7.6 (H) 4.8 - 5.6 % Final    Comment:             Prediabetes: 5.7 - 6.4          Diabetes: >6.4          Glycemic control for adults with diabetes: <7.0          Passed - eGFR in normal range and within  360 days    GFR, Est African American  Date Value Ref Range Status  09/09/2015 >89 >=60 mL/min Final   GFR calc Af Amer  Date Value Ref Range Status  12/09/2018 117 >59 mL/min/1.73 Final   GFR, Est Non African American  Date Value Ref Range Status  09/09/2015 >89 >=60 mL/min Final   GFR calc non Af Amer  Date Value Ref Range Status  12/09/2018 102 >59 mL/min/1.73 Final         Passed - Valid encounter within last 6 months    Recent Outpatient Visits          3 months ago Tingling of both feet   [facility] at [facility], Ranell Patrick, MD   1 year ago Annual physical exam   [facility] at [facility], Ranell Patrick, MD   1 year ago Hyperlipidemia, unspecified hyperlipidemia type  [facility] at [facility], Ranell Patrick, MD   2 years ago Left shoulder pain, unspecified chronicity   Primary Care at [facility], [place], [place]   2 years ago Annual physical exam   [facility] at [facility], Ranell Patrick, MD      Future Appointments            In 4 days [name], MD Primary Care at [place], [facility]           . lisinopril (ZESTRIL) 10 MG tablet [Pharmacy Med Name: LISINOPRIL 10MG TABLETS] 90 tablet 1    Sig: TAKE 1 TABLET(10 MG) BY MOUTH DAILY     Cardiovascular:  ACE Inhibitors Failed - 03/13/2019  3:32 AM      Failed - Last BP in normal range    BP Readings from Last 1 Encounters:  12/09/18 140/80         Passed - Cr in normal range and within 180 days    Creat  Date Value Ref Range Status  09/09/2015 0.64 0.50 - 1.05 mg/dL Final    Comment:      For patients > or = 58 years of age: The upper reference limit for Creatinine is approximately 13% higher for people identified as African-American.      Creatinine, Ser  Date Value Ref Range Status  12/09/2018 0.60 0.57 - 1.00 mg/dL Final         Passed - K in normal range and within 180 days    Potassium  Date Value Ref Range Status  12/09/2018 3.9 3.5 - 5.2 mmol/L Final         Passed - Patient is  not pregnant      Passed - Valid encounter within last 6 months    Recent Outpatient Visits          3 months ago Tingling of both feet   [facility] at [facility], Ranell Patrick, MD   1 year ago Annual physical exam   [facility] at [facility], Ranell Patrick, MD   1 year ago Hyperlipidemia, unspecified hyperlipidemia type   [facility] at [facility], Ranell Patrick, MD   2 years ago Left shoulder pain, unspecified chronicity   Primary Care at [facility], [place], [place]   2 years ago Annual physical exam   [facility] at [facility], Ranell Patrick, MD      Future Appointments            In 4 days [name], MD Primary Care at [place], [facility]

## 2019-03-17 ENCOUNTER — Ambulatory Visit: Payer: BC Managed Care – PPO | Admitting: Family Medicine

## 2019-03-17 ENCOUNTER — Other Ambulatory Visit: Payer: Self-pay

## 2019-03-17 NOTE — Progress Notes (Signed)
11:45 AM Ready to bring patient back for appt. She thought was lab only visit and made other appointments. She decided to reschedule for 3/27. Ok to refill meds if needed prior to that visit if needed.

## 2019-04-07 ENCOUNTER — Other Ambulatory Visit: Payer: Self-pay

## 2019-04-07 ENCOUNTER — Ambulatory Visit (INDEPENDENT_AMBULATORY_CARE_PROVIDER_SITE_OTHER): Payer: BC Managed Care – PPO | Admitting: Family Medicine

## 2019-04-07 ENCOUNTER — Encounter: Payer: Self-pay | Admitting: Family Medicine

## 2019-04-07 VITALS — BP 134/87 | HR 85 | Temp 97.6°F | Ht 62.5 in | Wt 184.0 lb

## 2019-04-07 DIAGNOSIS — R202 Paresthesia of skin: Secondary | ICD-10-CM | POA: Diagnosis not present

## 2019-04-07 DIAGNOSIS — E785 Hyperlipidemia, unspecified: Secondary | ICD-10-CM

## 2019-04-07 DIAGNOSIS — I10 Essential (primary) hypertension: Secondary | ICD-10-CM

## 2019-04-07 DIAGNOSIS — R809 Proteinuria, unspecified: Secondary | ICD-10-CM

## 2019-04-07 DIAGNOSIS — E1129 Type 2 diabetes mellitus with other diabetic kidney complication: Secondary | ICD-10-CM | POA: Diagnosis not present

## 2019-04-07 DIAGNOSIS — E1141 Type 2 diabetes mellitus with diabetic mononeuropathy: Secondary | ICD-10-CM

## 2019-04-07 MED ORDER — ATORVASTATIN CALCIUM 10 MG PO TABS: ORAL_TABLET | ORAL | 1 refills | Status: DC

## 2019-04-07 MED ORDER — METFORMIN HCL 1000 MG PO TABS: 1000.0000 mg | ORAL_TABLET | Freq: Two times a day (BID) | ORAL | 1 refills | Status: DC

## 2019-04-07 MED ORDER — LISINOPRIL 10 MG PO TABS: ORAL_TABLET | ORAL | 1 refills | Status: DC

## 2019-04-07 MED ORDER — GABAPENTIN 100 MG PO CAPS: ORAL_CAPSULE | ORAL | 1 refills | Status: DC

## 2019-04-07 MED ORDER — NIACIN ER (ANTIHYPERLIPIDEMIC) 750 MG PO TBCR: EXTENDED_RELEASE_TABLET | ORAL | 1 refills | Status: DC

## 2019-04-07 MED ORDER — HYDROCHLOROTHIAZIDE 25 MG PO TABS: 25.0000 mg | ORAL_TABLET | Freq: Every day | ORAL | 1 refills | Status: DC

## 2019-04-07 NOTE — Progress Notes (Signed)
Subjective:  Patient ID: Stephanie Nunez, female    DOB: [birth year]  Age: 59 y.o. MRN: [id number]  CC:  Chief Complaint  Patient presents with  . Diabetes    pt hasn't had any issues with her Diabetes. pt checks her BS once a week these are fasting BS's. pt repors her BS has been staying around 151m/dl. pt states she has been watching diet a little bit more closely since last visit.    HPI LNUHA DEGNERpresents for   Diabetes: With left foot neuropathy, microalbuminuria.  Metformin 10055mBID, occasional gabapentin use - few per week.  No med changes since elevated reading in November. Tried diet. Not exercising as much as prepandemic.  Would agree to SGLT2 if needed, not GLP for now.  No new side effects.  On ACE-I and statin.  Had been on vegetarian diet, changed to some more meat.  Home readings - fasting 150, 185.  Microalbumin: ratio 42 in 01/2018.  Optho, foot exam, pneumovax: up to date.  Had both covid vaccines.  Due for mammogram - plans on scheduling.    Lab Results  Component Value Date   HGBA1C 7.6 (H) 12/09/2018   HGBA1C 6.7 (H) 02/11/2018   HGBA1C 6.7 (H) 07/10/2017   Lab Results  Component Value Date   MICROALBUR 1.0 09/09/2015   LDLCALC 130 (H) 12/09/2018   CREATININE 0.60 12/09/2018   Hypertension: Lisinopril 1031mhctz 92m54mHome readings:no ne recently. No new side effects.  BP Readings from Last 3 Encounters:  04/07/19 134/87  12/09/18 140/80  02/11/18 138/80   Lab Results  Component Value Date   CREATININE 0.60 12/09/2018   Hyperlipidemia: lipitor 10mg6mto daily Taking 3 days per week for tolerance. Myalgias with daily use. Has not tried CoQ10. niaspan daily.  Lab Results  Component Value Date   CHOL 197 12/09/2018   HDL 55 12/09/2018   LDLCALC 130 (H) 12/09/2018   TRIG 65 12/09/2018   CHOLHDL 3.6 12/09/2018   Lab Results  Component Value Date   ALT 16 12/09/2018   AST 21 12/09/2018   ALKPHOS 77 12/09/2018   BILITOT 0.7  12/09/2018       History Patient Active Problem List   Diagnosis Date Noted  . Epiphora 01/05/2016  . HTN (hypertension) 02/01/2011  . Hyperlipemia 02/01/2011  . Obesity (BMI 30-39.9) 02/01/2011  . DM type 2 (diabetes mellitus, type 2) (HCC) [place]18/2013   Past Medical History:  Diagnosis Date  . Allergy   . Anemia   . Diabetes type 2, controlled (HCC) [place] Gestational diabetes mellitus   . Hyperlipidemia    no meds  . Hypertension   . Uterine fibroid    Past Surgical History:  Procedure Laterality Date  . BREAST SURGERY     fatty tumor under left breast  . LIPOMA EXCISION     Left chest wall  . LUMBAR SPINE SURGERY     L4-5   Allergies  Allergen Reactions  . Simvastatin     myalgia  . Statins     Severe musculoskeletal pains.   Prior to Admission medications   Medication Sig Start Date End Date Taking? Authorizing Provider  aspirin 81 MG tablet Take 81 mg by mouth daily.   Yes [provider]  atorvastatin (LIPITOR) 10 MG tablet TAKE 1 TABLET(10 MG) BY MOUTH EVERY OTHER DAY 03/02/19  Yes GreenWendie Agreste blood glucose meter kit and supplies Dispense based on insurance preference.  once daily as directed. E11.9 02/03/19  Yes [name], MD  cholecalciferol (VITAMIN D) 1000 units tablet Take 2,000 Units by mouth daily.   Yes [provider]  fluticasone (FLONASE) 50 MCG/ACT nasal spray SHAKE LIQUID AND USE 1 TO 2 SPRAYS IN EACH NOSTRIL DAILY 01/12/19  Yes [name], MD  gabapentin (NEURONTIN) 100 MG capsule TAKE 1 CAPSULE(100 MG) BY MOUTH EVERY EVENING 03/13/19  Yes [name], MD  hydrochlorothiazide (HYDRODIURIL) 25 MG tablet Take 1 tablet (25 mg total) by mouth daily. 12/09/18  Yes [name], MD  lisinopril (ZESTRIL) 10 MG tablet TAKE 1 TABLET(10 MG) BY MOUTH DAILY 03/13/19  Yes [name], MD  metFORMIN (GLUCOPHAGE) 1000 MG tablet TAKE 1 TABLET(1000 MG) BY MOUTH TWICE DAILY WITH A MEAL 03/13/19  Yes [name], MD  Multiple Vitamin (MULTIVITAMIN) tablet Take 1 tablet by mouth daily.   Yes [provider]  niacin (NIASPAN) 750 MG CR tablet TAKE 1-2 TABLETS(1500 MG) BY MOUTH AT BEDTIME 12/09/18  Yes [name], MD  [facility] ULTRA test strip USE TO TEST AS DIRECTED ONCE DAILY 02/07/19  Yes [provider]   Social History   Socioeconomic History  . Marital status: Married    Spouse name: Gerald Stabs  . Number of children: 1  . Years of education: 24  . Highest education level: Not on file  Occupational History  . Occupation: Control and instrumentation engineer  Tobacco Use  . Smoking status: Never Smoker  . Smokeless tobacco: Never Used  Substance and Sexual Activity  . Alcohol use: Yes    Alcohol/week: 4.0 standard drinks    Types: 4 Glasses of wine per week    Comment: 4 glasses wine a week   . Drug use: No  . Sexual activity: Yes    Partners: Male    Birth control/protection: Condom  Other Topics Concern  . Not on file  Social History Narrative   Lives with her husband (a Software engineer) and their daughter.  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 Difficulty of Paying Living Expenses:   Food Insecurity:   . Worried About Charity fundraiser in the Last Year:   . Arboriculturist in the Last Year:   Transportation Needs:   . Film/video editor (Medical):   Marland Kitchen Lack of Transportation (Non-Medical):   Physical Activity:   . Days of Exercise per Week:   . Minutes of Exercise per Session:   Stress:   . Feeling of Stress :   Social Connections:   . Frequency of Communication with Friends and Family:   . Frequency of Social Gatherings with Friends and Family:   . Attends Religious Services:   . Active Member of Clubs or Organizations:   . Attends Archivist Meetings:   Marland Kitchen Marital Status:   Intimate Partner Violence:   . Fear of Current or Ex-Partner:   .  Emotionally Abused:   Marland Kitchen Physically Abused:   . Sexually Abused:     Review of Systems  Constitutional: Negative for fatigue and unexpected weight change.  Respiratory: Negative for chest tightness and shortness of breath.   Cardiovascular: Negative for chest pain, palpitations and leg swelling.  Gastrointestinal: Negative for abdominal pain and blood in stool.  Neurological: Negative for dizziness, syncope, light-headedness and headaches.     Objective:   Vitals:  04/07/19 0944  BP: 134/87  Pulse: 85  Temp: 97.6 F (36.4 C)  TempSrc: Temporal  SpO2: 99%  Weight: 184 lb (83.5 kg)  Height: 5' 2.5" (1.588 m)     Physical Exam Vitals reviewed.  Constitutional:      Appearance: She is well-developed.  HENT:     Head: Normocephalic and atraumatic.  Eyes:     Conjunctiva/sclera: Conjunctivae normal.     Pupils: Pupils are equal, round, and reactive to light.  Neck:     Vascular: No carotid bruit.  Cardiovascular:     Rate and Rhythm: Normal rate and regular rhythm.     Heart sounds: Normal heart sounds.  Pulmonary:     Effort: Pulmonary effort is normal.     Breath sounds: Normal breath sounds.  Abdominal:     Palpations: Abdomen is soft. There is no pulsatile mass.     Tenderness: There is no abdominal tenderness.  Skin:    General: Skin is warm and dry.  Neurological:     Mental Status: She is alert and oriented to person, place, and time.  Psychiatric:        Behavior: Behavior normal.      Assessment & Plan:  NARALY FRITCHER is a 59 y.o. female . Type 2 diabetes mellitus with microalbuminuria, without long-term current use of insulin (HCC) - Plan: Hemoglobin A1c, metFORMIN (GLUCOPHAGE) 1000 MG tablet Diabetic mononeuropathy associated with type 2 diabetes mellitus ([place]) - Plan: gabapentin (NEURONTIN) 100 MG capsule  -Diabetes complicated by hyperglycemia, microalbuminuria, peripheral neuropathy.  Previous elevated A1c, repeat today.  If elevated again,  would consider adding SGLT2.  GLP-1 was discussed but she preferred to try SGLT2 first.  Potential side effects and risks were discussed.  Continue Metformin same dose for now.  -Health maintenance discussed with need for mammogram, phone number provided for her to schedule.  Hyperlipidemia, unspecified hyperlipidemia type - Plan: Comprehensive metabolic panel, atorvastatin (LIPITOR) 10 MG tablet, niacin (NIASPAN) 750 MG CR tablet  -Tolerating current combo, option of co-Q10 supplement to see if that would lessen side effects with statin more frequently, continue intermittent dosing if that is tolerated.  Continue Niaspan.  Essential hypertension - Plan: Comprehensive metabolic panel, lisinopril (ZESTRIL) 10 MG tablet, hydrochlorothiazide (HYDRODIURIL) 25 MG tablet  -Stable, continue same regimen  Tingling of both feet - Plan: gabapentin (NEURONTIN) 100 MG capsule  -As above thought to be component of diabetic neuropathy.  Continue gabapentin.  Meds ordered this encounter  Medications  . atorvastatin (LIPITOR) 10 MG tablet    Sig: TAKE 1 TABLET(10 MG) BY MOUTH EVERY OTHER DAY    Dispense:  90 tablet    Refill:  1  . gabapentin (NEURONTIN) 100 MG capsule    Sig: TAKE 1 CAPSULE(100 MG) BY MOUTH EVERY EVENING    Dispense:  90 capsule    Refill:  1  . lisinopril (ZESTRIL) 10 MG tablet    Sig: TAKE 1 TABLET(10 MG) BY MOUTH DAILY    Dispense:  90 tablet    Refill:  1  . metFORMIN (GLUCOPHAGE) 1000 MG tablet    Sig: Take 1 tablet (1,000 mg total) by mouth 2 (two) times daily with a meal.    Dispense:  180 tablet    Refill:  1  . niacin (NIASPAN) 750 MG CR tablet    Sig: TAKE 1-2 TABLETS(1500 MG) BY MOUTH AT BEDTIME    Dispense:  90 tablet    Refill:  1  . hydrochlorothiazide (HYDRODIURIL)  25 MG tablet    Sig: Take 1 tablet (25 mg total) by mouth daily.    Dispense:  90 tablet    Refill:  1   Patient Instructions    You can try CoQ10 supplement to see if you can tolerate lipitor  more often.   Depending on labs, may add new med that excretes sugar in urine. I will let you know. Keep up the good work with diet.   We recommend that you schedule a mammogram for breast cancer screening. Typically, you do not need a referral to do this. Please contact a local imaging center to schedule your mammogram.  [facility] - [phone]  *ask for the Radiology [place] ([place]) - [phone] or [phone]  [facility] - [phone] [place] [phone] [facility] - [phone]  *ask for the [facility] - [phone]  *ask for the Radiology Department [facility] - [phone]  *ask for the [place] - [phone]   If you have lab work done today you will be contacted with your lab results within the next 2 weeks.  If you have not heard from [place] then please contact us. The fastest way to get your results is to register for My Chart.   IF you received an x-ray today, you will receive an invoice from [facility]. Please contact [facility] Radiology at [phone] with questions or concerns regarding your invoice.   IF you received labwork today, you will receive an invoice from [place]. Please contact [facility] at [phone] with questions or concerns regarding your invoice.   Our billing staff will not be able to assist you with questions regarding bills from these companies.  You will be contacted with the lab results as soon as they are available. The fastest way to get your results is to activate your My Chart account. Instructions are located on the last page of this paperwork. If you have not heard from [place] regarding the results in 2 weeks, please contact this office.         Signed, Merri Ray, MD [facility]

## 2019-04-07 NOTE — Patient Instructions (Addendum)
  You can try CoQ10 supplement to see if you can tolerate lipitor more often.   Depending on labs, may add new med that excretes sugar in urine. I will let you know. Keep up the good work with diet.   We recommend that you schedule a mammogram for breast cancer screening. Typically, you do not need a referral to do this. Please contact a local imaging center to schedule your mammogram.  [facility] - [phone]  *ask for the Radiology [place] ([place]) - [phone] or [phone]  [facility] - [phone] [place] [phone] [facility] - [phone]  *ask for the [facility] - [phone]  *ask for the Radiology Department [facility] - [phone]  *ask for the [place] - [phone]   If you have lab work done today you will be contacted with your lab results within the next 2 weeks.  If you have not heard from [place] then please contact us. The fastest way to get your results is to register for My Chart.   IF you received an x-ray today, you will receive an invoice from [facility]. Please contact [facility] Radiology at [phone] with questions or concerns regarding your invoice.   IF you received labwork today, you will receive an invoice from [place]. Please contact [facility] at [phone] with questions or concerns regarding your invoice.   Our billing staff will not be able to assist you with questions regarding bills from these companies.  You will be contacted with the lab results as soon as they are available. The fastest way to get your results is to activate your My Chart account. Instructions are located on the last page of this paperwork. If you have not heard from [place] regarding the results in 2 weeks, please contact this office.

## 2019-04-08 LAB — COMPREHENSIVE METABOLIC PANEL
ALT: 14 IU/L (ref 0–32)
AST: 19 IU/L (ref 0–40)
Albumin/Globulin Ratio: 1.9 (ref 1.2–2.2)
Albumin: 4.8 g/dL (ref 3.8–4.9)
Alkaline Phosphatase: 75 IU/L (ref 39–117)
BUN/Creatinine Ratio: 15 (ref 9–23)
BUN: 9 mg/dL (ref 6–24)
Bilirubin Total: 0.6 mg/dL (ref 0.0–1.2)
CO2: 27 mmol/L (ref 20–29)
Calcium: 9.8 mg/dL (ref 8.7–10.2)
Chloride: 93 mmol/L — ABNORMAL LOW (ref 96–106)
Creatinine, Ser: 0.59 mg/dL (ref 0.57–1.00)
GFR calc Af Amer: 117 mL/min/{1.73_m2} (ref >59)
GFR calc non Af Amer: 101 mL/min/{1.73_m2} (ref >59)
Globulin, Total: 2.5 g/dL (ref 1.5–4.5)
Glucose: 134 mg/dL — ABNORMAL HIGH (ref 65–99)
Potassium: 3.9 mmol/L (ref 3.5–5.2)
Sodium: 138 mmol/L (ref 134–144)
Total Protein: 7.3 g/dL (ref 6.0–8.5)

## 2019-04-08 LAB — HEMOGLOBIN A1C
Est. average glucose Bld gHb Est-mCnc: 166 mg/dL
Hgb A1c MFr Bld: 7.4 % — ABNORMAL HIGH (ref 4.8–5.6)

## 2019-04-17 ENCOUNTER — Other Ambulatory Visit: Payer: Self-pay | Admitting: Family Medicine

## 2019-04-17 DIAGNOSIS — E1129 Type 2 diabetes mellitus with other diabetic kidney complication: Secondary | ICD-10-CM

## 2019-04-17 MED ORDER — FARXIGA 5 MG PO TABS: 5.0000 mg | ORAL_TABLET | Freq: Every day | ORAL | 0 refills | Status: DC

## 2019-05-05 ENCOUNTER — Other Ambulatory Visit: Payer: Self-pay | Admitting: Family Medicine

## 2019-05-05 NOTE — Telephone Encounter (Signed)
Requested medication (s) are due for refill today: yes  Requested medication (s) are on the active medication list: yes  Last refill: 02/07/19  Future visit scheduled: yes  Notes to clinic:  historical provider   Requested Prescriptions  Pending Prescriptions Disp Refills   ONETOUCH ULTRA test strip [Pharmacy Med Name: [facility] TESTST(NEW)100] 100 strip     Sig: USE TO TEST AS DIRECTED ONCE DAILY      Endocrinology: Diabetes - Testing Supplies Passed - 05/05/2019  8:06 AM      Passed - Valid encounter within last 12 months    Recent Outpatient Visits           4 weeks ago Type 2 diabetes mellitus with microalbuminuria, without long-term current use of insulin ([place])   [facility] [place], MD   1 month ago    [facility], Ranell Patrick, MD   4 months ago Tingling of both feet   [facility], Ranell Patrick, MD   1 year ago Annual physical exam   [facility] [place], MD   1 year ago Hyperlipidemia, unspecified hyperlipidemia type   [facility], Ranell Patrick, MD       Future Appointments             In 2 months Carlota Raspberry Ranell Patrick, MD [facility] [place], [facility]

## 2019-05-14 ENCOUNTER — Other Ambulatory Visit: Payer: Self-pay | Admitting: Family Medicine

## 2019-05-14 DIAGNOSIS — E785 Hyperlipidemia, unspecified: Secondary | ICD-10-CM

## 2019-06-10 ENCOUNTER — Other Ambulatory Visit: Payer: Self-pay | Admitting: Family Medicine

## 2019-06-10 DIAGNOSIS — R809 Proteinuria, unspecified: Secondary | ICD-10-CM

## 2019-06-10 DIAGNOSIS — E1141 Type 2 diabetes mellitus with diabetic mononeuropathy: Secondary | ICD-10-CM

## 2019-06-10 DIAGNOSIS — I10 Essential (primary) hypertension: Secondary | ICD-10-CM

## 2019-06-10 DIAGNOSIS — R202 Paresthesia of skin: Secondary | ICD-10-CM

## 2019-06-21 LAB — HM MAMMOGRAPHY

## 2019-06-21 LAB — HM DEXA SCAN: HM Dexa Scan: NORMAL

## 2019-06-23 ENCOUNTER — Telehealth: Payer: Self-pay

## 2019-06-23 NOTE — Telephone Encounter (Signed)
Called pt to inform her that her Bone Density scan came back normal. No answer Left VM to call us back.

## 2019-07-07 ENCOUNTER — Ambulatory Visit: Payer: BC Managed Care – PPO | Admitting: Family Medicine

## 2019-07-07 ENCOUNTER — Other Ambulatory Visit: Payer: Self-pay

## 2019-07-07 ENCOUNTER — Encounter: Payer: Self-pay | Admitting: Family Medicine

## 2019-07-07 VITALS — BP 109/73 | HR 86 | Temp 97.9°F | Ht 62.5 in | Wt 182.0 lb

## 2019-07-07 DIAGNOSIS — I10 Essential (primary) hypertension: Secondary | ICD-10-CM

## 2019-07-07 DIAGNOSIS — E1129 Type 2 diabetes mellitus with other diabetic kidney complication: Secondary | ICD-10-CM | POA: Diagnosis not present

## 2019-07-07 DIAGNOSIS — E785 Hyperlipidemia, unspecified: Secondary | ICD-10-CM

## 2019-07-07 DIAGNOSIS — R809 Proteinuria, unspecified: Secondary | ICD-10-CM

## 2019-07-07 NOTE — Patient Instructions (Addendum)
  Thanks for coming in today.  Potentially can increase the dose of the [place] but I will look at your blood work first.  I am glad to hear you are doing well on that medication.  Keep up the good work with diet.  Follow-up in 3 months.  If there are any concerns on your blood tests I will let you know.   If you have lab work done today you will be contacted with your lab results within the next 2 weeks.  If you have not heard from [place] then please contact us. The fastest way to get your results is to register for My Chart.   IF you received an x-ray today, you will receive an invoice from [facility]. Please contact [facility]iology at [phone] with questions or concerns regarding your invoice.   IF you received labwork today, you will receive an invoice from [place]. Please contact [facility] at [phone] with questions or concerns regarding your invoice.   Our billing staff will not be able to assist you with questions regarding bills from these companies.  You will be contacted with the lab results as soon as they are available. The fastest way to get your results is to activate your My Chart account. Instructions are located on the last page of this paperwork. If you have not heard from [place] regarding the results in 2 weeks, please contact this office.

## 2019-07-07 NOTE — Progress Notes (Signed)
Subjective:  Patient ID: Stephanie Nunez, female    DOB: Sep 20, 1960  Age: 59 y.o. MRN: [id number]  CC:  Chief Complaint  Patient presents with   Diabetes    pt reports no issues with this condition. pt checks her BS once a month. pt reports she has been doing better to watch what she eats. pt reports taking her medication as directed with no known side effects.    HPI Stephanie Nunez presents for   Diabetes: With microalbuminuria. Continued on Metformin 1000 mg twice daily, added Farxiga 5 mg on April 3. She has been watching her diet better, started back walking.  No new side effects with medications.  She is on ACE inhibitor as well as statin. Microalbumin: Ratio of 42 on 02/11/2018 Optho, foot exam, pneumovax: Up-to-date.  No yeast infections.  Home readings - rare. No sx lows.  Wt Readings from Last 3 Encounters:  07/07/19 182 lb (82.6 kg)  04/07/19 184 lb (83.5 kg)  12/09/18 188 lb 9.6 oz (85.5 kg)    Covid vaccine: had both vaccines.   Lab Results  Component Value Date   HGBA1C 7.4 (H) 04/07/2019   HGBA1C 7.6 (H) 12/09/2018   HGBA1C 6.7 (H) 02/11/2018   Lab Results  Component Value Date   MICROALBUR 1.0 09/09/2015   [place] 130 (H) 12/09/2018   CREATININE 0.59 04/07/2019      History Patient Active Problem List   Diagnosis Date Noted   Epiphora 01/05/2016   HTN (hypertension) 02/01/2011   Hyperlipemia 02/01/2011   Obesity (BMI 30-39.9) 02/01/2011   DM type 2 (diabetes mellitus, type 2) ([place]) 02/01/2011   Past Medical History:  Diagnosis Date   Allergy    Anemia    Diabetes type 2, controlled ([place])    Gestational diabetes mellitus    Hyperlipidemia    no meds   Hypertension    Uterine fibroid    Past Surgical History:  Procedure Laterality Date   BREAST SURGERY     fatty tumor under left breast   LIPOMA EXCISION     Left chest wall   LUMBAR SPINE SURGERY     L4-5   Allergies  Allergen Reactions   Simvastatin      myalgia   Statins     Severe musculoskeletal pains.   Prior to Admission medications   Medication Sig Start Date End Date Taking? Authorizing Provider  aspirin 81 MG tablet Take 81 mg by mouth daily.   Yes [provider]  atorvastatin (LIPITOR) 10 MG tablet TAKE 1 TABLET(10 MG) BY MOUTH EVERY OTHER DAY 04/07/19  Yes [name], MD  blood glucose meter kit and supplies Dispense based on insurance preference. once daily as directed. E11.9 02/03/19  Yes [name], MD  cholecalciferol (VITAMIN D) 1000 units tablet Take 2,000 Units by mouth daily.   Yes [provider]  dapagliflozin propanediol (FARXIGA) 5 MG TABS tablet Take 5 mg by mouth daily before breakfast. 04/17/19  Yes [name], MD  fluticasone [facility]) 50 MCG/ACT nasal spray SHAKE LIQUID AND USE 1 TO 2 SPRAYS IN EACH NOSTRIL DAILY 01/12/19  Yes [name], MD  gabapentin (NEURONTIN) 100 MG capsule TAKE 1 CAPSULE(100 MG) BY MOUTH EVERY EVENING 06/10/19  Yes [name], MD  hydrochlorothiazide (HYDRODIURIL) 25 MG tablet Take 1 tablet (25 mg total) by mouth daily. 04/07/19  Yes [name], MD  lisinopril (ZESTRIL) 10 MG tablet TAKE 1 TABLET(10 MG) BY MOUTH DAILY  06/10/19  Yes [name], MD  metFORMIN (GLUCOPHAGE) 1000 MG tablet TAKE 1 TABLET(1000 MG) BY MOUTH TWICE DAILY WITH A MEAL 06/10/19  Yes [name], MD  Multiple Vitamin (MULTIVITAMIN) tablet Take 1 tablet by mouth daily.   Yes [provider]  niacin (NIASPAN) 750 MG CR tablet TAKE 2 TABLETS(1500 MG) BY MOUTH AT BEDTIME 05/14/19  Yes [name], MD  [facility] ULTRA test strip USE TO TEST AS DIRECTED ONCE DAILY 05/05/19  Yes [name], MD   Social History   Socioeconomic History   Marital status: Married    Spouse name: Gerald Stabs   Number of children: 1   Years of education: 16   Highest education level: Not on file  Occupational History   Occupation: Control and instrumentation engineer  Tobacco Use     Smoking status: Never Smoker   Smokeless tobacco: Never Used  Substance and Sexual Activity   Alcohol use: Yes    Alcohol/week: 4.0 standard drinks    Types: 4 Glasses of wine per week    Comment: 4 glasses wine a week    Drug use: No   Sexual activity: Yes    Partners: Male    Birth control/protection: Condom  Other Topics Concern   Not on file  Social History Narrative   Lives with her husband (a Software engineer) and their daughter.  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, clinical   Social Determinants of Radio broadcast assistant Strain:    Difficulty of Paying Living Expenses:   Food Insecurity:    Worried About Charity fundraiser in the Last Year:    Arboriculturist in the Last Year:   Transportation Needs:    Film/video editor (Medical):    Lack of Transportation (Non-Medical):   Physical Activity:    Days of Exercise per Week:    Minutes of Exercise per Session:   Stress:    Feeling of Stress :   Social Connections:    Frequency of Communication with Friends and Family:    Frequency of Social Gatherings with Friends and Family:    Attends Religious Services:    Active Member of Clubs or Organizations:    Attends Music therapist:    Marital Status:   Intimate Partner Violence:    Fear of Current or Ex-Partner:    Emotionally Abused:    Physically Abused:    Sexually Abused:     Review of Systems  Constitutional: Negative for fatigue and unexpected weight change.  Respiratory: Negative for chest tightness and shortness of breath.   Cardiovascular: Negative for chest pain, palpitations and leg swelling.  Gastrointestinal: Negative for abdominal pain and blood in stool.  Neurological: Negative for dizziness, syncope, light-headedness and headaches.     Objective:   Vitals:   07/07/19 0835  BP: 109/73  Pulse: 86  Temp: 97.9 F (36.6 C)  TempSrc: Temporal  SpO2: 97%  Weight: 182 lb  (82.6 kg)  Height: 5' 2.5" (1.588 m)     Physical Exam Vitals reviewed.  Constitutional:      Appearance: She is well-developed.  HENT:     Head: Normocephalic and atraumatic.  Eyes:     Conjunctiva/sclera: Conjunctivae normal.     Pupils: Pupils are equal, round, and reactive to light.  Neck:     Vascular: No carotid bruit.  Cardiovascular:     Rate and Rhythm: Normal rate and regular rhythm.  Heart sounds: Normal heart sounds.  Pulmonary:     Effort: Pulmonary effort is normal.     Breath sounds: Normal breath sounds.  Abdominal:     Palpations: Abdomen is soft. There is no pulsatile mass.     Tenderness: There is no abdominal tenderness.  Skin:    General: Skin is warm and dry.  Neurological:     Mental Status: She is alert and oriented to person, place, and time.  Psychiatric:        Behavior: Behavior normal.        Assessment & Plan:  Stephanie Nunez is a 59 y.o. female . Type 2 diabetes mellitus with microalbuminuria, without long-term current use of insulin (HCC) - Plan: Hemoglobin A1c  -Commended on weight loss, diet and exercise changes.  Tolerating current regimen, check A1c.  Consider higher dose [place].   Hyperlipidemia, unspecified hyperlipidemia type - Plan: Comprehensive metabolic panel, Lipid panel  -Tolerating current regimen, check labs  Essential hypertension - Plan: Comprehensive metabolic panel, Lipid panel  -Stable, check labs.  No med changes  No orders of the defined types were placed in this encounter.  Patient Instructions    Thanks for coming in today.  Potentially can increase the dose of the [place] but I will look at your blood work first.  I am glad to hear you are doing well on that medication.  Keep up the good work with diet.  Follow-up in 3 months.  If there are any concerns on your blood tests I will let you know.   If you have lab work done today you will be contacted with your lab results within the next 2 weeks.  If  you have not heard from [place] then please contact us. The fastest way to get your results is to register for My Chart.   IF you received an x-ray today, you will receive an invoice from [facility]. Please contact [facility] at [phone] with questions or concerns regarding your invoice.   IF you received labwork today, you will receive an invoice from [place]. Please contact [facility] at [phone] with questions or concerns regarding your invoice.   Our billing staff will not be able to assist you with questions regarding bills from these companies.  You will be contacted with the lab results as soon as they are available. The fastest way to get your results is to activate your My Chart account. Instructions are located on the last page of this paperwork. If you have not heard from [place] regarding the results in 2 weeks, please contact this office.         Signed, Merri Ray, MD [facility]

## 2019-07-08 LAB — LIPID PANEL
Chol/HDL Ratio: 2.8 ratio (ref 0.0–4.4)
Cholesterol, Total: 158 mg/dL (ref 100–199)
HDL: 56 mg/dL (ref >39)
LDL Chol Calc (NIH): 91 mg/dL (ref 0–99)
Triglycerides: 53 mg/dL (ref 0–149)
VLDL Cholesterol Cal: 11 mg/dL (ref 5–40)

## 2019-07-08 LAB — COMPREHENSIVE METABOLIC PANEL
ALT: 159 IU/L — ABNORMAL HIGH (ref 0–32)
AST: 148 IU/L — ABNORMAL HIGH (ref 0–40)
Albumin/Globulin Ratio: 2 (ref 1.2–2.2)
Albumin: 4.9 g/dL (ref 3.8–4.9)
Alkaline Phosphatase: 168 IU/L — ABNORMAL HIGH (ref 48–121)
BUN/Creatinine Ratio: 17 (ref 9–23)
BUN: 12 mg/dL (ref 6–24)
Bilirubin Total: 0.7 mg/dL (ref 0.0–1.2)
CO2: 27 mmol/L (ref 20–29)
Calcium: 9.5 mg/dL (ref 8.7–10.2)
Chloride: 93 mmol/L — ABNORMAL LOW (ref 96–106)
Creatinine, Ser: 0.71 mg/dL (ref 0.57–1.00)
GFR calc Af Amer: 109 mL/min/{1.73_m2} (ref >59)
GFR calc non Af Amer: 94 mL/min/{1.73_m2} (ref >59)
Globulin, Total: 2.4 g/dL (ref 1.5–4.5)
Glucose: 131 mg/dL — ABNORMAL HIGH (ref 65–99)
Potassium: 4 mmol/L (ref 3.5–5.2)
Sodium: 137 mmol/L (ref 134–144)
Total Protein: 7.3 g/dL (ref 6.0–8.5)

## 2019-07-08 LAB — HEMOGLOBIN A1C
Est. average glucose Bld gHb Est-mCnc: 203 mg/dL
Hgb A1c MFr Bld: 8.7 % — ABNORMAL HIGH (ref 4.8–5.6)

## 2019-07-19 ENCOUNTER — Other Ambulatory Visit: Payer: Self-pay | Admitting: Family Medicine

## 2019-07-19 DIAGNOSIS — R7989 Other specified abnormal findings of blood chemistry: Secondary | ICD-10-CM

## 2019-07-22 ENCOUNTER — Other Ambulatory Visit: Payer: Self-pay

## 2019-07-22 ENCOUNTER — Ambulatory Visit (INDEPENDENT_AMBULATORY_CARE_PROVIDER_SITE_OTHER): Payer: BC Managed Care – PPO | Admitting: Family Medicine

## 2019-07-22 DIAGNOSIS — R7989 Other specified abnormal findings of blood chemistry: Secondary | ICD-10-CM

## 2019-07-23 ENCOUNTER — Encounter: Payer: Self-pay | Admitting: Family Medicine

## 2019-07-23 LAB — HEPATIC FUNCTION PANEL
ALT: 88 IU/L — ABNORMAL HIGH (ref 0–32)
AST: 28 IU/L (ref 0–40)
Albumin: 4.7 g/dL (ref 3.8–4.9)
Alkaline Phosphatase: 118 IU/L (ref 48–121)
Bilirubin Total: 0.8 mg/dL (ref 0.0–1.2)
Bilirubin, Direct: 0.21 mg/dL (ref 0.00–0.40)
Total Protein: 7.4 g/dL (ref 6.0–8.5)

## 2019-07-24 ENCOUNTER — Other Ambulatory Visit: Payer: Self-pay | Admitting: Family Medicine

## 2019-07-24 DIAGNOSIS — R809 Proteinuria, unspecified: Secondary | ICD-10-CM

## 2019-07-24 MED ORDER — DAPAGLIFLOZIN PROPANEDIOL 10 MG PO TABS: 10.0000 mg | ORAL_TABLET | Freq: Every day | ORAL | 1 refills | Status: DC

## 2019-07-24 NOTE — Telephone Encounter (Signed)
New labs noted.  Will send her a message regarding new labs which look much better.

## 2019-08-03 ENCOUNTER — Other Ambulatory Visit: Payer: Self-pay | Admitting: Family Medicine

## 2019-08-03 DIAGNOSIS — I10 Essential (primary) hypertension: Secondary | ICD-10-CM

## 2019-08-03 NOTE — Telephone Encounter (Signed)
Requested Prescriptions  Pending Prescriptions Disp Refills  . hydrochlorothiazide (HYDRODIURIL) 25 MG tablet [Pharmacy Med Name: HYDROCHLOROTHIAZIDE 25MG  TABLETS] 90 tablet 1    Sig: TAKE 1 TABLET BY MOUTH EVERY DAY     Cardiovascular: Diuretics - Thiazide Passed - 08/03/2019 10:44 AM      Passed - Ca in normal range and within 360 days    Calcium  Date Value Ref Range Status  07/07/2019 9.5 8.7 - 10.2 mg/dL Final         Passed - Cr in normal range and within 360 days    Creat  Date Value Ref Range Status  09/09/2015 0.64 0.50 - 1.05 mg/dL Final    Comment:      For patients > or = 58 years of age: The upper reference limit for Creatinine is approximately 13% higher for people identified as African-American.      Creatinine, Ser  Date Value Ref Range Status  07/07/2019 0.71 0.57 - 1.00 mg/dL Final         Passed - K in normal range and within 360 days    Potassium  Date Value Ref Range Status  07/07/2019 4.0 3.5 - 5.2 mmol/L Final         Passed - Na in normal range and within 360 days    Sodium  Date Value Ref Range Status  07/07/2019 137 134 - 144 mmol/L Final         Passed - Last BP in normal range    BP Readings from Last 1 Encounters:  07/07/19 109/73         Passed - Valid encounter within last 6 months    Recent Outpatient Visits          1 week ago Elevated LFTs   [facility] [place], Ranell Patrick, MD   3 weeks ago Type 2 diabetes mellitus with microalbuminuria, without long-term current use of insulin [facility])   [facility] [place], Ranell Patrick, MD   3 months ago Type 2 diabetes mellitus with microalbuminuria, without long-term current use of insulin [facility])   [facility] [place], MD   4 months ago    [facility] [place], MD   7 months ago Tingling of both feet   [facility] [place], Ranell Patrick, MD      Future Appointments            In 2 months Carlota Raspberry Ranell Patrick, MD Primary Care at  [place], [facility]

## 2019-08-11 ENCOUNTER — Other Ambulatory Visit: Payer: Self-pay

## 2019-08-11 ENCOUNTER — Ambulatory Visit (INDEPENDENT_AMBULATORY_CARE_PROVIDER_SITE_OTHER): Payer: BC Managed Care – PPO | Admitting: Family Medicine

## 2019-08-11 DIAGNOSIS — R7989 Other specified abnormal findings of blood chemistry: Secondary | ICD-10-CM

## 2019-08-12 ENCOUNTER — Other Ambulatory Visit: Payer: Self-pay | Admitting: Family Medicine

## 2019-08-12 DIAGNOSIS — E785 Hyperlipidemia, unspecified: Secondary | ICD-10-CM

## 2019-08-12 LAB — HEPATIC FUNCTION PANEL
ALT: 102 IU/L — ABNORMAL HIGH (ref 0–32)
AST: 37 IU/L (ref 0–40)
Albumin: 4.8 g/dL (ref 3.8–4.9)
Alkaline Phosphatase: 125 IU/L — ABNORMAL HIGH (ref 48–121)
Bilirubin Total: 0.6 mg/dL (ref 0.0–1.2)
Bilirubin, Direct: 0.14 mg/dL (ref 0.00–0.40)
Total Protein: 7.1 g/dL (ref 6.0–8.5)

## 2019-09-30 ENCOUNTER — Other Ambulatory Visit: Payer: Self-pay | Admitting: Family Medicine

## 2019-09-30 DIAGNOSIS — E1129 Type 2 diabetes mellitus with other diabetic kidney complication: Secondary | ICD-10-CM

## 2019-10-04 ENCOUNTER — Encounter: Payer: Self-pay | Admitting: Family Medicine

## 2019-10-04 ENCOUNTER — Other Ambulatory Visit: Payer: Self-pay

## 2019-10-04 ENCOUNTER — Ambulatory Visit: Payer: BC Managed Care – PPO | Admitting: Family Medicine

## 2019-10-04 VITALS — BP 118/80 | HR 89 | Temp 98.0°F | Ht 62.5 in | Wt 177.0 lb

## 2019-10-04 DIAGNOSIS — E1129 Type 2 diabetes mellitus with other diabetic kidney complication: Secondary | ICD-10-CM

## 2019-10-04 DIAGNOSIS — R7989 Other specified abnormal findings of blood chemistry: Secondary | ICD-10-CM

## 2019-10-04 DIAGNOSIS — Z23 Encounter for immunization: Secondary | ICD-10-CM | POA: Diagnosis not present

## 2019-10-04 DIAGNOSIS — E785 Hyperlipidemia, unspecified: Secondary | ICD-10-CM

## 2019-10-04 DIAGNOSIS — R809 Proteinuria, unspecified: Secondary | ICD-10-CM

## 2019-10-04 DIAGNOSIS — I10 Essential (primary) hypertension: Secondary | ICD-10-CM | POA: Diagnosis not present

## 2019-10-04 DIAGNOSIS — E1141 Type 2 diabetes mellitus with diabetic mononeuropathy: Secondary | ICD-10-CM

## 2019-10-04 DIAGNOSIS — R202 Paresthesia of skin: Secondary | ICD-10-CM

## 2019-10-04 MED ORDER — GABAPENTIN 100 MG PO CAPS: ORAL_CAPSULE | ORAL | 1 refills | Status: DC

## 2019-10-04 MED ORDER — LISINOPRIL 10 MG PO TABS: ORAL_TABLET | ORAL | 1 refills | Status: DC

## 2019-10-04 MED ORDER — METFORMIN HCL 1000 MG PO TABS: ORAL_TABLET | ORAL | 1 refills | Status: DC

## 2019-10-04 MED ORDER — NIACIN ER (ANTIHYPERLIPIDEMIC) 750 MG PO TBCR: EXTENDED_RELEASE_TABLET | ORAL | 1 refills | Status: DC

## 2019-10-04 MED ORDER — ATORVASTATIN CALCIUM 10 MG PO TABS: ORAL_TABLET | ORAL | 1 refills | Status: DC

## 2019-10-04 NOTE — Progress Notes (Signed)
Subjective:  Patient ID: Stephanie Nunez, female    DOB: June 24, 1960  Age: 59 y.o. MRN: [id number]  CC:  Chief Complaint  Patient presents with  . Follow-up    on diabetes. pt reports no issuies with this condition since last OV. pt reports she chescks her BS's 2x a week. pt dose report an issue with numbness and tingling in both of her legs first thing in the morning, but gos away after the pt drinks water.    HPI Stephanie Nunez presents for  Diabetes: With microalbuminuria.  Previous foot neuropathy Currently treated with 1000 mg Metformin twice daily, Farxiga 10 mg daily (increased in June), she is on statin with Lipitor 10 mg, ACE inhibitor with lisinopril 10 mg daily. No new side effects with meds.  Fasting: 165 or lower. Lowest 134.  Episodic dysesthesias in lower legs in the morning and improves with drinking water.  Gabapentin 100 mg nightly has helped. Only occasional sx's. No weakness.  Microalbumin: Elevated ratio 42 in January 2020 Optho, foot exam, pneumovax: Up-to-date COVID-19 vaccine: had Moderna in Feb/March.    Lab Results  Component Value Date   HGBA1C 8.7 (H) 07/07/2019   HGBA1C 7.4 (H) 04/07/2019   HGBA1C 7.6 (H) 12/09/2018   Lab Results  Component Value Date   MICROALBUR 1.0 09/09/2015   LDLCALC 91 07/07/2019   CREATININE 0.71 07/07/2019   Hypertension: Lisinopril 10 mg daily, HCTZ 25 mg daily Home readings: none. No new side effects.  BP Readings from Last 3 Encounters:  10/04/19 118/80  07/07/19 109/73  04/07/19 134/87   Lab Results  Component Value Date   CREATININE 0.71 07/07/2019    Hyperlipidemia: Lipitor 10 mg daily, Niaspan 750 mg CR daily Lab Results  Component Value Date   CHOL 158 07/07/2019   HDL 56 07/07/2019   LDLCALC 91 07/07/2019   TRIG 53 07/07/2019   CHOLHDL 2.8 07/07/2019   Lab Results  Component Value Date   ALT 102 (H) 08/11/2019   AST 37 08/11/2019   ALKPHOS 125 (H) 08/11/2019   BILITOT 0.6 08/11/2019    Elevated LFTs: Peak elevation June 23 with alk phos 168, AST 148, ALT 159. Improved on repeat testing, still elevated ALT of 102 in July.  Option of repeat ultrasound, not yet done. No abd pain/n/v/scleral icterus.    History Patient Active Problem List   Diagnosis Date Noted  . Epiphora 01/05/2016  . HTN (hypertension) 02/01/2011  . Hyperlipemia 02/01/2011  . Obesity (BMI 30-39.9) 02/01/2011  . DM type 2 (diabetes mellitus, type 2) ([place]) 02/01/2011   Past Medical History:  Diagnosis Date  . Allergy   . Anemia   . Diabetes type 2, controlled ([place])   . Gestational diabetes mellitus   . Hyperlipidemia    no meds  . Hypertension   . Uterine fibroid    Past Surgical History:  Procedure Laterality Date  . BREAST SURGERY     fatty tumor under left breast  . LIPOMA EXCISION     Left chest wall  . LUMBAR SPINE SURGERY     L4-5   Allergies  Allergen Reactions  . Simvastatin     myalgia  . Statins     Severe musculoskeletal pains.   Prior to Admission medications   Medication Sig Start Date End Date Taking? Authorizing Provider  aspirin 81 MG tablet Take 81 mg by mouth daily.   Yes [provider]  atorvastatin (LIPITOR) 10 MG tablet TAKE 1 TABLET(10 MG)  BY MOUTH EVERY OTHER DAY 04/07/19  Yes [name], MD  blood glucose meter kit and supplies Dispense based on insurance preference. once daily as directed. E11.9 02/03/19  Yes [name], MD  cholecalciferol (VITAMIN D) 1000 units tablet Take 2,000 Units by mouth daily.   Yes [provider]  dapagliflozin propanediol (FARXIGA) 10 MG TABS tablet Take 1 tablet (10 mg total) by mouth daily before breakfast. 07/24/19  Yes [name], MD  fluticasone (FLONASE) 50 MCG/ACT nasal spray SHAKE LIQUID AND USE 1 TO 2 SPRAYS IN EACH NOSTRIL DAILY 01/12/19  Yes [name], MD  gabapentin (NEURONTIN) 100 MG capsule TAKE 1 CAPSULE(100 MG) BY MOUTH EVERY EVENING 06/10/19  Yes [name], MD   hydrochlorothiazide (HYDRODIURIL) 25 MG tablet TAKE 1 TABLET BY MOUTH EVERY DAY 08/03/19  Yes [name], MD  lisinopril (ZESTRIL) 10 MG tablet TAKE 1 TABLET(10 MG) BY MOUTH DAILY 06/10/19  Yes [name], MD  metFORMIN (GLUCOPHAGE) 1000 MG tablet TAKE 1 TABLET(1000 MG) BY MOUTH TWICE DAILY WITH A MEAL 06/10/19  Yes [name], MD  Multiple Vitamin (MULTIVITAMIN) tablet Take 1 tablet by mouth daily.   Yes [provider]  niacin (NIASPAN) 750 MG CR tablet TAKE 2 TABLETS(1500 MG) BY MOUTH AT BEDTIME 08/12/19  Yes [name], MD  [facility] ULTRA test strip USE TO TEST AS DIRECTED ONCE DAILY 08/03/19  Yes [name], MD   Social History   Socioeconomic History  . Marital status: Married    Spouse name: Gerald Stabs  . Number of children: 1  . Years of education: 59  . Highest education level: Not on file  Occupational History  . Occupation: Control and instrumentation engineer  Tobacco Use  . Smoking status: Never Smoker  . Smokeless tobacco: Never Used  Substance and Sexual Activity  . Alcohol use: Yes    Alcohol/week: 4.0 standard drinks    Types: 4 Glasses of wine per week    Comment: 4 glasses wine a week   . Drug use: No  . Sexual activity: Yes    Partners: Male    Birth control/protection: Condom  Other Topics Concern  . Not on file  Social History Narrative   Lives with her husband (a Software engineer) and their daughter.  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 Difficulty of Paying Living Expenses: Not on file  Food Insecurity:   . Worried About Charity fundraiser in the Last Year: Not on file  . Ran Out of Food in the Last Year: Not on file  Transportation Needs:   . Lack of Transportation (Medical): Not on file  . Lack of Transportation (Non-Medical): Not on file  Physical Activity:   . Days of Exercise per Week: Not on file  . Minutes of Exercise per Session:  Not on file  Stress:   . Feeling of Stress : Not on file  Social Connections:   . Frequency of Communication with Friends and Family: Not on file  . Frequency of Social Gatherings with Friends and Family: Not on file  . Attends Religious Services: Not on file  . Active Member of Clubs or Organizations: Not on file  . Attends Archivist Meetings: Not on file  . Marital Status: Not on file  Intimate Partner Violence:   . Fear of Current or Ex-Partner: Not on  file  . Emotionally Abused: Not on file  . Physically Abused: Not on file  . Sexually Abused: Not on file    Review of Systems  Constitutional: Negative for fatigue and unexpected weight change.  Respiratory: Negative for chest tightness and shortness of breath.   Cardiovascular: Negative for chest pain, palpitations and leg swelling.  Gastrointestinal: Negative for abdominal pain and blood in stool.  Neurological: Negative for dizziness, syncope, light-headedness and headaches.     Objective:   Vitals:   10/04/19 0802  BP: 118/80  Pulse: 89  Temp: 98 F (36.7 C)  TempSrc: Temporal  SpO2: 98%  Weight: 177 lb (80.3 kg)  Height: 5' 2.5" (1.588 m)     Physical Exam Vitals reviewed.  Constitutional:      Appearance: She is well-developed.  HENT:     Head: Normocephalic and atraumatic.  Eyes:     Conjunctiva/sclera: Conjunctivae normal.     Pupils: Pupils are equal, round, and reactive to light.  Neck:     Vascular: No carotid bruit.  Cardiovascular:     Rate and Rhythm: Normal rate and regular rhythm.     Heart sounds: Normal heart sounds.  Pulmonary:     Effort: Pulmonary effort is normal.     Breath sounds: Normal breath sounds.  Abdominal:     Palpations: Abdomen is soft. There is no pulsatile mass.     Tenderness: There is no abdominal tenderness.  Musculoskeletal:     Comments: Sensation intact and equal lower extremities bilaterally  Skin:    General: Skin is warm and dry.  Neurological:       Mental Status: She is alert and oriented to person, place, and time.  Psychiatric:        Behavior: Behavior normal.        Assessment & Plan:  Stephanie Nunez is a 59 y.o. female . Type 2 diabetes mellitus with microalbuminuria, without long-term current use of insulin (HCC) - Plan: Hemoglobin A1c, Microalbumin / creatinine urine ratio  -Decreased control last visit, check A1c.  Continue higher dose of Farxiga, Metformin.  Essential hypertension - Plan: Comprehensive metabolic panel  -  Stable, tolerating current regimen. Medications refilled. Labs pending as above.   Hyperlipidemia, unspecified hyperlipidemia type - Plan: Comprehensive metabolic panel  -Tolerating combo of Lipitor and Niaspan.  Continue same.  Need for prophylactic vaccination and inoculation against influenza - Plan: Flu Vaccine QUAD 36+ mos IM  Elevated LFTs  -Slight decrease last time from peak levels in June.  Repeat testing today.  If still elevated could consider updated ultrasound as last one in 2016.  Tingling of both feet  -Suspected neuropathy from diabetes.  Overall stable, intermittent temporary symptoms in the morning, option of higher dose of gabapentin 200 mg nightly.  She will call me/my chart if using higher dose to adjust refills. Recheck 3 months  No orders of the defined types were placed in this encounter.  Patient Instructions       If you have lab work done today you will be contacted with your lab results within the next 2 weeks.  If you have not heard from [place] then please contact us. The fastest way to get your results is to register for My Chart.   IF you received an x-ray today, you will receive an invoice from [facility]. Please contact [facility] at [phone] with questions or concerns regarding your invoice.   IF you received labwork today, you will receive an  invoice from [place]. Please contact [facility] at [phone] with questions or concerns  regarding your invoice.   Our billing staff will not be able to assist you with questions regarding bills from these companies.  You will be contacted with the lab results as soon as they are available. The fastest way to get your results is to activate your My Chart account. Instructions are located on the last page of this paperwork. If you have not heard from [place] regarding the results in 2 weeks, please contact this office.         Signed, Merri Ray, MD [facility]

## 2019-10-04 NOTE — Patient Instructions (Addendum)
  If more frequent numbness or nerve symptoms in legs, can take 2 gabapentin. Let me know if you increase the dose.   I will check A1c, liver tests again today.  If liver tests are still elevated, we can order an ultrasound.  Let me know if there are questions.  Recheck in 3 months.  Thank you for coming in today.   If you have lab work done today you will be contacted with your lab results within the next 2 weeks.  If you have not heard from [place] then please contact us. The fastest way to get your results is to register for My Chart.   IF you received an x-ray today, you will receive an invoice from [facility]. Please contact [facility] at [phone] with questions or concerns regarding your invoice.   IF you received labwork today, you will receive an invoice from [place]. Please contact [facility] at [phone] with questions or concerns regarding your invoice.   Our billing staff will not be able to assist you with questions regarding bills from these companies.  You will be contacted with the lab results as soon as they are available. The fastest way to get your results is to activate your My Chart account. Instructions are located on the last page of this paperwork. If you have not heard from [place] regarding the results in 2 weeks, please contact this office.

## 2019-10-05 LAB — HEMOGLOBIN A1C
Est. average glucose Bld gHb Est-mCnc: 148 mg/dL
Hgb A1c MFr Bld: 6.8 % — ABNORMAL HIGH (ref 4.8–5.6)

## 2019-10-05 LAB — COMPREHENSIVE METABOLIC PANEL
ALT: 13 IU/L (ref 0–32)
AST: 21 IU/L (ref 0–40)
Albumin/Globulin Ratio: 1.8 (ref 1.2–2.2)
Albumin: 4.7 g/dL (ref 3.8–4.9)
Alkaline Phosphatase: 72 IU/L (ref 44–121)
BUN/Creatinine Ratio: 21 (ref 9–23)
BUN: 15 mg/dL (ref 6–24)
Bilirubin Total: 0.7 mg/dL (ref 0.0–1.2)
CO2: 26 mmol/L (ref 20–29)
Calcium: 9.6 mg/dL (ref 8.7–10.2)
Chloride: 97 mmol/L (ref 96–106)
Creatinine, Ser: 0.73 mg/dL (ref 0.57–1.00)
GFR calc Af Amer: 105 mL/min/{1.73_m2} (ref >59)
GFR calc non Af Amer: 91 mL/min/{1.73_m2} (ref >59)
Globulin, Total: 2.6 g/dL (ref 1.5–4.5)
Glucose: 99 mg/dL (ref 65–99)
Potassium: 4.1 mmol/L (ref 3.5–5.2)
Sodium: 139 mmol/L (ref 134–144)
Total Protein: 7.3 g/dL (ref 6.0–8.5)

## 2019-10-05 LAB — MICROALBUMIN / CREATININE URINE RATIO
Creatinine, Urine: 44.6 mg/dL
Microalb/Creat Ratio: <7 mg/g creat (ref 0–29)
Microalbumin, Urine: <3 ug/mL

## 2019-11-20 ENCOUNTER — Other Ambulatory Visit: Payer: Self-pay | Admitting: Family Medicine

## 2019-11-20 DIAGNOSIS — E785 Hyperlipidemia, unspecified: Secondary | ICD-10-CM

## 2019-11-20 NOTE — Telephone Encounter (Signed)
Requested Prescriptions  Pending Prescriptions Disp Refills   niacin (NIASPAN) 750 MG CR tablet [Pharmacy Med Name: NIACIN 750MG  ER TABLETS] 180 tablet 1    Sig: TAKE 2 TABLETS(1500 MG) BY MOUTH AT BEDTIME     Cardiovascular:  Antilipid - niacin Failed - 11/20/2019  8:57 AM      Failed - LDL in normal range and within 360 days    LDL Chol Calc (NIH)  Date Value Ref Range Status  07/07/2019 91 0 - 99 mg/dL Final         Passed - AST in normal range and within 360 days    AST  Date Value Ref Range Status  10/04/2019 21 0 - 40 IU/L Final         Passed - ALT in normal range and within 360 days    ALT  Date Value Ref Range Status  10/04/2019 13 0 - 32 IU/L Final         Passed - Total Cholesterol in normal range and within 360 days    Cholesterol, Total  Date Value Ref Range Status  07/07/2019 158 100 - 199 mg/dL Final         Passed - HDL in normal range and within 360 days    HDL  Date Value Ref Range Status  07/07/2019 56 >39 mg/dL Final         Passed - Triglycerides in normal range and within 360 days    Triglycerides  Date Value Ref Range Status  07/07/2019 53 0 - 149 mg/dL Final         Passed - Valid encounter within last 12 months    Recent Outpatient Visits          1 month ago Type 2 diabetes mellitus with microalbuminuria, without long-term current use of insulin ([place])   [facility], Ranell Patrick, MD   3 months ago Elevated LFTs   [facility], Ranell Patrick, MD   4 months ago Elevated LFTs   [facility], Ranell Patrick, MD   4 months ago Type 2 diabetes mellitus with microalbuminuria, without long-term current use of insulin [facility])   [facility], Ranell Patrick, MD   7 months ago Type 2 diabetes mellitus with microalbuminuria, without long-term current use of insulin [facility])   [facility], Ranell Patrick, MD

## 2019-12-10 ENCOUNTER — Other Ambulatory Visit: Payer: Self-pay | Admitting: Family Medicine

## 2019-12-10 DIAGNOSIS — E1129 Type 2 diabetes mellitus with other diabetic kidney complication: Secondary | ICD-10-CM

## 2019-12-10 DIAGNOSIS — E1141 Type 2 diabetes mellitus with diabetic mononeuropathy: Secondary | ICD-10-CM

## 2019-12-10 DIAGNOSIS — R202 Paresthesia of skin: Secondary | ICD-10-CM

## 2019-12-10 DIAGNOSIS — I10 Essential (primary) hypertension: Secondary | ICD-10-CM

## 2020-01-16 ENCOUNTER — Other Ambulatory Visit: Payer: Self-pay | Admitting: Family Medicine

## 2020-01-16 DIAGNOSIS — J302 Other seasonal allergic rhinitis: Secondary | ICD-10-CM

## 2020-02-01 ENCOUNTER — Other Ambulatory Visit: Payer: Self-pay | Admitting: Family Medicine

## 2020-02-01 DIAGNOSIS — I10 Essential (primary) hypertension: Secondary | ICD-10-CM

## 2020-03-09 ENCOUNTER — Other Ambulatory Visit: Payer: Self-pay

## 2020-03-09 ENCOUNTER — Telehealth: Payer: BC Managed Care – PPO | Admitting: Family Medicine

## 2020-03-09 ENCOUNTER — Encounter: Payer: Self-pay | Admitting: Family Medicine

## 2020-03-09 VITALS — Ht 62.5 in | Wt 185.0 lb

## 2020-03-09 DIAGNOSIS — H04123 Dry eye syndrome of bilateral lacrimal glands: Secondary | ICD-10-CM

## 2020-03-09 DIAGNOSIS — R233 Spontaneous ecchymoses: Secondary | ICD-10-CM | POA: Diagnosis not present

## 2020-03-09 DIAGNOSIS — E785 Hyperlipidemia, unspecified: Secondary | ICD-10-CM | POA: Diagnosis not present

## 2020-03-09 DIAGNOSIS — T148XXA Other injury of unspecified body region, initial encounter: Secondary | ICD-10-CM

## 2020-03-09 DIAGNOSIS — R809 Proteinuria, unspecified: Secondary | ICD-10-CM

## 2020-03-09 DIAGNOSIS — N898 Other specified noninflammatory disorders of vagina: Secondary | ICD-10-CM

## 2020-03-09 DIAGNOSIS — I10 Essential (primary) hypertension: Secondary | ICD-10-CM | POA: Diagnosis not present

## 2020-03-09 DIAGNOSIS — R682 Dry mouth, unspecified: Secondary | ICD-10-CM

## 2020-03-09 DIAGNOSIS — E1129 Type 2 diabetes mellitus with other diabetic kidney complication: Secondary | ICD-10-CM | POA: Diagnosis not present

## 2020-03-09 MED ORDER — ESTROGENS, CONJUGATED 0.625 MG/GM VA CREA: 1.0000 | TOPICAL_CREAM | Freq: Every day | VAGINAL | 12 refills | Status: DC

## 2020-03-09 NOTE — Progress Notes (Signed)
Virtual Visit via Telephone Note  I connected with Stephanie Nunez on 03/09/20 at 6:26 PM by telephone and verified that I am speaking with the correct person using two identifiers. Patient location:home.  My location: office    I discussed the limitations, risks, security and privacy concerns of performing an evaluation and management service by telephone and the availability of in person appointments. I also discussed with the patient that there may be a patient responsible charge related to this service. The patient expressed understanding and agreed to proceed, consent obtained  Chief complaint: Chief Complaint  Patient presents with  . Allergic Reaction    Pt reports she thinks she was having areaction to [place]. PT reports she felt weird. Pt reports her mouth being dry ad feeling like she had sores in her mouth, but once she looked in her mouth no sores were found. Pt reports pain in her R shoulder that seemed to disappear after the pt stopped taking the medication. Pt states dryness in her genitals. Pt states she isn't sure if it's a reaction to the medication or menopause setting in.     History of Present Illness: Stephanie Nunez is a 60 y.o. female with history of hypertension, diabetes, hyperlipidemia, elevated LFTs.  Last visit in September 2021.  23-monthfollow-up planned.  Diabetes: With microalbuminuria, neuropathy Last visit September 2021.  Treated with Metformin 1000 mg twice daily, Farxiga 10 mg daily, had increased that dose in June 2021, statin with Lipitor, ACE inhibitor with lisinopril.  Episodic dysesthesias in lower legs improved with drinking water and gabapentin 100 mg nightly was helping.  Started with vaginal burning/dry feeling and painful intercourse (at opening) about a month ago. Slightly better with use of condom. Has tried KY gel - min relief. Use of condom was better. No external rash/discharge or bleeding.   Then had feeling like her mouth was dry,  feel like she had sores in her mouth but no sores were seen.    Not sure if this was reaction to medication or menopausal symptoms. Stopped farxiga one week ago, and started aUAL Corporationfor a few days.  Feels better past week off farxiga. Prior shoulder pain has improved.  Some bruising on thighs only. at times past month, fade in a week or so. No nosebleed/vaginal bleeding/gum bleeding.  LMP 4 years ago. Some initial hot flushes in menopause, improved then returned recently.  Gabapentin as needed only - none in past week (only taking for foot tingling).  Still with some dry mouth, dry vaginal mucosa.  No vision changes or new dry eyes, but uses systane 2 gtts per day.   Taking otc antihistamine past week only for few days for allergies. On probiotic.   microalbumin nl in 09/2019, ratio 42 in 01/2018.  No change in thirst/polyuria.  95oz water per day.   Home readings 117-135 depending on diet. No lows, highest 142. No 200's.  Optho, foot exam, pneumovax:   Lab Results  Component Value Date   HGBA1C 6.8 (H) 10/04/2019   HGBA1C 8.7 (H) 07/07/2019   HGBA1C 7.4 (H) 04/07/2019   Lab Results  Component Value Date   MICROALBUR 1.0 09/09/2015   LDLCALC 91 07/07/2019   CREATININE 0.73 10/04/2019       Patient Active Problem List   Diagnosis Date Noted  . Epiphora 01/05/2016  . HTN (hypertension) 02/01/2011  . Hyperlipemia 02/01/2011  . Obesity (BMI 30-39.9) 02/01/2011  . DM type 2 (diabetes mellitus, type 2) [place] 02/01[place]  Past Medical History:  Diagnosis Date  . Allergy   . Anemia   . Diabetes type 2, controlled ([place])   . Gestational diabetes mellitus   . Hyperlipidemia    no meds  . Hypertension   . Uterine fibroid    Past Surgical History:  Procedure Laterality Date  . BREAST SURGERY     fatty tumor under left breast  . LIPOMA EXCISION     Left chest wall  . LUMBAR SPINE SURGERY     L4-5   Allergies  Allergen Reactions  . Simvastatin     myalgia  . Statins      Severe musculoskeletal pains.   Prior to Admission medications   Medication Sig Start Date End Date Taking? Authorizing Provider  aspirin 81 MG tablet Take 81 mg by mouth daily.   Yes [provider]  atorvastatin (LIPITOR) 10 MG tablet TAKE 1 TABLET(10 MG) BY MOUTH EVERY OTHER DAY 10/04/19  Yes [name], MD  blood glucose meter kit and supplies Dispense based on insurance preference. once daily as directed. E11.9 02/03/19  Yes [name], MD  cholecalciferol (VITAMIN D) 1000 units tablet Take 2,000 Units by mouth daily.   Yes [provider]  fluticasone (FLONASE) 50 MCG/ACT nasal spray SHAKE LIQUID AND USE 1 TO 2 SPRAYS IN EACH NOSTRIL DAILY 01/17/20  Yes [name], MD  gabapentin (NEURONTIN) 100 MG capsule TAKE 1 CAPSULE(100 MG) BY MOUTH EVERY EVENING 10/04/19  Yes [name], MD  hydrochlorothiazide (HYDRODIURIL) 25 MG tablet TAKE 1 TABLET BY MOUTH EVERY DAY 02/01/20  Yes [name], MD  lisinopril (ZESTRIL) 10 MG tablet TAKE 1 TABLET(10 MG) BY MOUTH DAILY 10/04/19  Yes [name], MD  metFORMIN (GLUCOPHAGE) 1000 MG tablet TAKE 1 TABLET(1000 MG) BY MOUTH TWICE DAILY WITH A MEAL 10/04/19  Yes [name], MD  Multiple Vitamin (MULTIVITAMIN) tablet Take 1 tablet by mouth daily.   Yes [provider]  niacin (NIASPAN) 750 MG CR tablet TAKE 2 TABLETS(1500 MG) BY MOUTH AT BEDTIME 11/20/19  Yes [name], MD  [facility] ULTRA test strip USE TO TEST AS DIRECTED ONCE DAILY 08/03/19  Yes [name], MD  dapagliflozin propanediol (FARXIGA) 10 MG TABS tablet Take 1 tablet (10 mg total) by mouth daily before breakfast. Patient not taking: Reported on 03/09/2020 07/24/19   [name], MD   Social History   Socioeconomic History  . Marital status: Married    Spouse name: Gerald Stabs  . Number of children: 1  . Years of education: 84  . Highest education level: Not on file  Occupational History  . Occupation: Advertising copywriter  Tobacco Use  . Smoking status: Never Smoker  . Smokeless tobacco: Never Used  Substance and Sexual Activity  . Alcohol use: Yes    Alcohol/week: 4.0 standard drinks    Types: 4 Glasses of wine per week    Comment: 4 glasses wine a week   . Drug use: No  . Sexual activity: Yes    Partners: Male    Birth control/protection: Condom  Other Topics Concern  . Not on file  Social History Narrative   Lives with her husband (a Software engineer) and their daughter.  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  All questions were answered with understanding of plan expressed.   Assessment and Plan: Type 2 diabetes mellitus with microalbuminuria, without long-term current use of insulin (HCC) - Plan: Hemoglobin A1c  Bruising - Plan: CBC  Essential hypertension - Plan: Comprehensive metabolic panel, Lipid panel  Hyperlipidemia, unspecified hyperlipidemia type - Plan: Lipid panel  Dry mouth - Plan: ANA,IFA RA Diag Pnl w/rflx Tit/Patn, Sjogren's syndrome antibods(ssa + ssb), Sedimentation Rate  Vaginal dryness - Plan: conjugated estrogens (PREMARIN) vaginal cream  Dry eyes - Plan: ANA,IFA RA Diag Pnl w/rflx Tit/Patn, Sjogren's syndrome antibods(ssa + ssb), Sedimentation Rate  Due for follow-up labs for diabetes, hypertension, hyperlipidemia.  Less likely the dry eyes, dry nasal mucosa are due to [place] but okay to hold off for right now.  Denies vaginal discharge or rash in mouth but mycotic infection for vaginal symptoms  with Wilder Glade possible, unknown cause of dry mouth.  Vaginal dryness may also be due to menopausal symptoms.  Differential includes sicca syndrome, Sjogren's.  Antihistamine also possible cause but more recent use.  Also reports intermittent bruising to thighs that resolves without other areas of bleeding.  -Check A1c, CMP, lipid panel at lab only visit.  Okay to hold [place] for now.  -Topical estrogen vaginal cream for possible postmenopausal symptoms  -Continue fluid intake.    -More consistent gabapentin may help with menopausal symptoms.  -Check Sjogren's antibodies, ANA, RA, sed rate to evaluate for Sjogren's  -Check CBC with bruising, RTC precautions but will evaluate in office in 1 week.  Follow Up Instructions: Lab visit next week followed by in person visit next week.   I discussed the assessment and treatment plan with the patient. The patient was provided an opportunity to ask questions and all were answered. The patient agreed with the plan and demonstrated an understanding of the instructions.   The patient was advised to call back or seek an in-person evaluation if the symptoms worsen or if the condition fails to improve as anticipated.  I provided 34 minutes of non-face-to-face time during this encounter.  Signed,   Merri Ray, MD [facility].  03/09/20

## 2020-03-09 NOTE — Patient Instructions (Signed)
° ° ° °  If you have lab work done today you will be contacted with your lab results within the next 2 weeks.  If you have not heard from us then please contact us. The fastest way to get your results is to register for My Chart. ° ° °IF you received an x-ray today, you will receive an invoice from [facility]. Please contact [facility] at [phone] with questions or concerns regarding your invoice.  ° °IF you received labwork today, you will receive an invoice from [facility]. Please contact [facility] at [phone] with questions or concerns regarding your invoice.  ° °Our billing staff will not be able to assist you with questions regarding bills from these companies. ° °You will be contacted with the lab results as soon as they are available. The fastest way to get your results is to activate your My Chart account. Instructions are located on the last page of this paperwork. If you have not heard from us regarding the results in 2 weeks, please contact this office. °  ° ° ° °

## 2020-03-10 ENCOUNTER — Ambulatory Visit (INDEPENDENT_AMBULATORY_CARE_PROVIDER_SITE_OTHER): Payer: BC Managed Care – PPO

## 2020-03-10 ENCOUNTER — Other Ambulatory Visit: Payer: Self-pay

## 2020-03-10 DIAGNOSIS — E785 Hyperlipidemia, unspecified: Secondary | ICD-10-CM

## 2020-03-10 DIAGNOSIS — H04123 Dry eye syndrome of bilateral lacrimal glands: Secondary | ICD-10-CM

## 2020-03-10 DIAGNOSIS — R682 Dry mouth, unspecified: Secondary | ICD-10-CM

## 2020-03-10 DIAGNOSIS — E1129 Type 2 diabetes mellitus with other diabetic kidney complication: Secondary | ICD-10-CM

## 2020-03-10 DIAGNOSIS — I10 Essential (primary) hypertension: Secondary | ICD-10-CM

## 2020-03-10 DIAGNOSIS — R809 Proteinuria, unspecified: Secondary | ICD-10-CM

## 2020-03-10 DIAGNOSIS — T148XXA Other injury of unspecified body region, initial encounter: Secondary | ICD-10-CM

## 2020-03-10 NOTE — Progress Notes (Signed)
Lab given this AM

## 2020-03-16 ENCOUNTER — Other Ambulatory Visit: Payer: Self-pay | Admitting: Family Medicine

## 2020-03-16 ENCOUNTER — Ambulatory Visit: Payer: BC Managed Care – PPO | Admitting: Family Medicine

## 2020-03-16 ENCOUNTER — Other Ambulatory Visit: Payer: Self-pay

## 2020-03-16 VITALS — BP 138/87 | HR 72 | Temp 98.3°F | Resp 15 | Ht 62.5 in | Wt 178.7 lb

## 2020-03-16 DIAGNOSIS — B370 Candidal stomatitis: Secondary | ICD-10-CM

## 2020-03-16 DIAGNOSIS — E1129 Type 2 diabetes mellitus with other diabetic kidney complication: Secondary | ICD-10-CM | POA: Diagnosis not present

## 2020-03-16 DIAGNOSIS — N898 Other specified noninflammatory disorders of vagina: Secondary | ICD-10-CM | POA: Diagnosis not present

## 2020-03-16 DIAGNOSIS — R809 Proteinuria, unspecified: Secondary | ICD-10-CM

## 2020-03-16 DIAGNOSIS — R682 Dry mouth, unspecified: Secondary | ICD-10-CM

## 2020-03-16 DIAGNOSIS — I10 Essential (primary) hypertension: Secondary | ICD-10-CM

## 2020-03-16 DIAGNOSIS — E785 Hyperlipidemia, unspecified: Secondary | ICD-10-CM

## 2020-03-16 LAB — COMPREHENSIVE METABOLIC PANEL
ALT: 15 IU/L (ref 0–32)
AST: 20 IU/L (ref 0–40)
Albumin/Globulin Ratio: 1.7 (ref 1.2–2.2)
Albumin: 4.3 g/dL (ref 3.8–4.9)
Alkaline Phosphatase: 68 IU/L (ref 44–121)
BUN/Creatinine Ratio: 14 (ref 9–23)
BUN: 10 mg/dL (ref 6–24)
Bilirubin Total: 0.6 mg/dL (ref 0.0–1.2)
CO2: 28 mmol/L (ref 20–29)
Calcium: 9.6 mg/dL (ref 8.7–10.2)
Chloride: 94 mmol/L — ABNORMAL LOW (ref 96–106)
Creatinine, Ser: 0.71 mg/dL (ref 0.57–1.00)
GFR calc Af Amer: 109 mL/min/{1.73_m2} (ref >59)
GFR calc non Af Amer: 94 mL/min/{1.73_m2} (ref >59)
Globulin, Total: 2.6 g/dL (ref 1.5–4.5)
Glucose: 115 mg/dL — ABNORMAL HIGH (ref 65–99)
Potassium: 4.8 mmol/L (ref 3.5–5.2)
Sodium: 136 mmol/L (ref 134–144)
Total Protein: 6.9 g/dL (ref 6.0–8.5)

## 2020-03-16 LAB — CBC
Hematocrit: 42.3 % (ref 34.0–46.6)
Hemoglobin: 14.3 g/dL (ref 11.1–15.9)
MCH: 31.4 pg (ref 26.6–33.0)
MCHC: 33.8 g/dL (ref 31.5–35.7)
MCV: 93 fL (ref 79–97)
Platelets: 380 10*3/uL (ref 150–450)
RBC: 4.56 x10E6/uL (ref 3.77–5.28)
RDW: 12.4 % (ref 11.7–15.4)
WBC: 3.7 10*3/uL (ref 3.4–10.8)

## 2020-03-16 LAB — LIPID PANEL
Chol/HDL Ratio: 4 ratio (ref 0.0–4.4)
Cholesterol, Total: 241 mg/dL — ABNORMAL HIGH (ref 100–199)
HDL: 61 mg/dL (ref >39)
LDL Chol Calc (NIH): 169 mg/dL — ABNORMAL HIGH (ref 0–99)
Triglycerides: 64 mg/dL (ref 0–149)
VLDL Cholesterol Cal: 11 mg/dL (ref 5–40)

## 2020-03-16 LAB — SEDIMENTATION RATE: Sed Rate: 4 mm/hr (ref 0–40)

## 2020-03-16 LAB — SJOGREN'S SYNDROME ANTIBODS(SSA + SSB)
ENA SSA (RO) Ab: <0.2 AI (ref 0.0–0.9)
ENA SSB (LA) Ab: <0.2 AI (ref 0.0–0.9)

## 2020-03-16 LAB — ANA,IFA RA DIAG PNL W/RFLX TIT/PATN
ANA Titer 1: NEGATIVE
Cyclic Citrullin Peptide Ab: 8 units (ref 0–19)
Rheumatoid fact SerPl-aCnc: <10 IU/mL (ref <14.0)

## 2020-03-16 LAB — HEMOGLOBIN A1C
Est. average glucose Bld gHb Est-mCnc: 137 mg/dL
Hgb A1c MFr Bld: 6.4 % — ABNORMAL HIGH (ref 4.8–5.6)

## 2020-03-16 LAB — POCT SKIN KOH: Skin KOH, POC: NEGATIVE

## 2020-03-16 MED ORDER — LOVASTATIN 20 MG PO TABS: 20.0000 mg | ORAL_TABLET | ORAL | 1 refills | Status: DC

## 2020-03-16 MED ORDER — CLOTRIMAZOLE 10 MG MT TROC
10.0000 mg | Freq: Every day | OROMUCOSAL | 0 refills | Status: DC
Start: 2020-03-16 — End: 2020-08-10

## 2020-03-16 NOTE — Progress Notes (Signed)
Subjective:  Patient ID: Stephanie Nunez, female    DOB: [birth year]  Age: 60 y.o. MRN: [id number]  CC:  Chief Complaint  Patient presents with  . Diabetes    Pt here for 1 week recheck reports 167 elevated BG the other day. Pt reports usually lower. No concerns today     HPI [name] presents for   Diabetes: With microalbuminuria, neuropathy Recent labs stable.  Treated with Metformin 1000 mg twice daily, Farxiga 10 mg daily, and is on ACE inhibitor, Lipitor.  Video visit February 24 with concerns of Wilder Glade, stopped for 1 week at that time due to concern of vaginal burning, dry feeling, dry mouth, sores in her mouth but no visible sores. Home readings usually 120-130, did have 167 few days ago. Microalbumin: Normal ratio September 2021, previously 25 in January 2020 Less exercise prior to blood draw. Back to exercising.  Optho, foot exam, pneumovax: Ophthalmology exam:  Lab Results  Component Value Date   HGBA1C 6.4 (H) 03/10/2020   HGBA1C 6.8 (H) 10/04/2019   HGBA1C 8.7 (H) 07/07/2019   Lab Results  Component Value Date   MICROALBUR 1.0 09/09/2015   LDLCALC 169 (H) 03/10/2020   CREATININE 0.71 03/10/2020    Dry mouth Discussed at video visit February 24.  Sjogren antibodies, R rheumatoid factor, CCP, sed rate obtained.  All normal. Less dry mouth. No meds/treatments.  Spicy food affects mouth. Sore inside mouth at times. ?white patches inside mouth after last visit.   Vaginal dryness Discussed at recent video visit.  Possible menopausal symptoms.  Consistent gabapentin use discussed with menopausal symptoms as well as topical estrogen vaginal cream for possible atrophic vaginitis. Topical estrogen 2 times last week - helped some with dryness.   Hyperlipidemia: Lipitor 10 mg every other day, but only taking once per week with Coq10. Still with body aches few days later.  Did not tolerate simvastatin.   Lab Results  Component Value Date   CHOL 241 (H)  03/10/2020   HDL 61 03/10/2020   LDLCALC 169 (H) 03/10/2020   TRIG 64 03/10/2020   CHOLHDL 4.0 03/10/2020   Lab Results  Component Value Date   ALT 15 03/10/2020   AST 20 03/10/2020   ALKPHOS 68 03/10/2020   BILITOT 0.6 03/10/2020   Hypertension: HCTZ 25 mg daily, lisinopril 10 mg daily  Home readings: no recent readings BP Readings from Last 3 Encounters:  03/16/20 138/87  10/04/19 118/80  07/07/19 109/73   Lab Results  Component Value Date   CREATININE 0.71 03/10/2020        History Patient Active Problem List   Diagnosis Date Noted  . Epiphora 01/05/2016  . HTN (hypertension) 02/01/2011  . Hyperlipemia 02/01/2011  . Obesity (BMI 30-39.9) 02/01/2011  . DM type 2 (diabetes mellitus, type 2) ([place]) 02/01/2011   Past Medical History:  Diagnosis Date  . Allergy   . Anemia   . Diabetes type 2, controlled ([place])   . Gestational diabetes mellitus   . Hyperlipidemia    no meds  . Hypertension   . Uterine fibroid    Past Surgical History:  Procedure Laterality Date  . BREAST SURGERY     fatty tumor under left breast  . LIPOMA EXCISION     Left chest wall  . LUMBAR SPINE SURGERY     L4-5   Allergies  Allergen Reactions  . Simvastatin     myalgia  . Statins     Severe musculoskeletal pains.  Prior to Admission medications   Medication Sig Start Date End Date Taking? Authorizing Provider  aspirin 81 MG tablet Take 81 mg by mouth daily.    [provider]  atorvastatin (LIPITOR) 10 MG tablet TAKE 1 TABLET(10 MG) BY MOUTH EVERY OTHER DAY 10/04/19   [name], MD  blood glucose meter kit and supplies Dispense based on insurance preference. once daily as directed. E11.9 02/03/19   [name], MD  cholecalciferol (VITAMIN D) 1000 units tablet Take 2,000 Units by mouth daily.    [provider]  conjugated estrogens (PREMARIN) vaginal cream Place 1 Applicatorful vaginally daily. Up to few days per week as needed. 03/09/20   [name], MD  dapagliflozin propanediol (FARXIGA) 10 MG TABS tablet Take 1 tablet (10 mg total) by mouth daily before breakfast. Patient not taking: Reported on 03/09/2020 07/24/19   [name], MD  fluticasone [facility]) 50 MCG/ACT nasal spray SHAKE LIQUID AND USE 1 TO 2 SPRAYS IN EACH NOSTRIL DAILY 01/17/20   [name], MD  gabapentin (NEURONTIN) 100 MG capsule TAKE 1 CAPSULE(100 MG) BY MOUTH EVERY EVENING 10/04/19   [name], MD  hydrochlorothiazide (HYDRODIURIL) 25 MG tablet TAKE 1 TABLET BY MOUTH EVERY DAY 02/01/20   [name], MD  lisinopril (ZESTRIL) 10 MG tablet TAKE 1 TABLET(10 MG) BY MOUTH DAILY 10/04/19   [name], MD  metFORMIN (GLUCOPHAGE) 1000 MG tablet TAKE 1 TABLET(1000 MG) BY MOUTH TWICE DAILY WITH A MEAL 10/04/19   [name], MD  Multiple Vitamin (MULTIVITAMIN) tablet Take 1 tablet by mouth daily.    [provider]  niacin (NIASPAN) 750 MG CR tablet TAKE 2 TABLETS(1500 MG) BY MOUTH AT BEDTIME 11/20/19   [name], MD  [facility] ULTRA test strip USE TO TEST AS DIRECTED ONCE DAILY 08/03/19   [name], MD   Social History   Socioeconomic History  . Marital status: Married    Spouse name: Gerald Stabs  . Number of children: 1  . Years of education: 19  . Highest education level: Not on file  Occupational History  . Occupation: Control and instrumentation engineer  Tobacco Use  . Smoking status: Never Smoker  . Smokeless tobacco: Never Used  Substance and Sexual Activity  . Alcohol use: Yes    Alcohol/week: 4.0 standard drinks    Types: 4 Glasses of wine per week    Comment: 4 glasses wine a week   . Drug use: No  . Sexual activity: Yes    Partners: Male    Birth control/protection: Condom  Other Topics Concern  . Not on file  Social History Narrative   Lives with her husband (a Software engineer) and their daughter.  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  Respiratory: Negative for chest tightness and shortness of breath.   Cardiovascular: Negative for chest pain, palpitations and leg swelling.  Gastrointestinal: Negative for abdominal pain and blood in stool.  Neurological: Negative for dizziness, syncope, light-headedness and headaches.     Objective:   Vitals:   03/16/20 0957  BP: 138/87  Pulse: 72  Resp: 15  Temp: 98.3 F (36.8 C)  TempSrc: Temporal  SpO2: 99%  Weight: 178 lb 11.2 oz (81.1 kg)  Height: 5' 2.5" (1.588 m)     Physical Exam Vitals reviewed.  Constitutional:      Appearance: She is well-developed and well-nourished.  HENT:     Head: Normocephalic and atraumatic.     Mouth/Throat:     Mouth: Mucous membranes are moist.     Comments: White patches on buccal mucosa and tongue.  Eyes:     Extraocular Movements: EOM normal.     Conjunctiva/sclera: Conjunctivae normal.     Pupils: Pupils are equal, round, and reactive to light.  Neck:     Vascular: No carotid bruit.  Cardiovascular:     Rate and Rhythm: Normal rate and regular rhythm.     Pulses: Intact distal pulses.     Heart sounds: Normal heart sounds.  Pulmonary:     Effort: Pulmonary effort is normal.     Breath sounds: Normal breath sounds.  Abdominal:     Palpations: Abdomen is soft. There is no pulsatile mass.     Tenderness: There is no abdominal tenderness.  Skin:    General: Skin is warm and dry.  Neurological:     Mental Status: She is alert and oriented to person, place, and time.  Psychiatric:        Mood and Affect: Mood and affect normal.        Behavior: Behavior normal.     32 minutes spent during visit, greater than  50% counseling and assimilation of information, chart review, and discussion of plan.   Results for orders placed or performed in visit on 03/16/20  POCT Skin KOH  Result Value Ref Range   Skin KOH, POC Negative Negative     Assessment & Plan:  CHRYSTEL BAREFIELD is a 60 y.o. female . Type 2 diabetes mellitus with microalbuminuria, without long-term current use of insulin (HCC)  -I expect exercise will help control.  Less likely Farxiga cause previous symptoms, could try to restart at lower dose.  Based on most recent A1c will hold on new meds at this time but home monitoring discussed with option of low-dose Farxiga if elevated readings.  Dry mouth Thrush - Plan: clotrimazole (MYCELEX) 10 MG troche, POCT Skin KOH  -Possible thrush based on exam, will try Mycelex atrocious with RTC precautions if not improving or worsening.  Vaginal dryness  -Improved with topical estrogen, continue same.  Essential hypertension  -Stable, continue same regimen goal less than 130/80, home monitoring recommended with RTC precautions  Hyperlipidemia, unspecified hyperlipidemia type - Plan: lovastatin (MEVACOR) 20 MG tablet  -Did not tolerate simvastatin, will try low-dose lovastatin with intermittent dosing initially, then slow increase as tolerated.  RTC precautions.  Meds ordered this encounter  Medications  . lovastatin (MEVACOR) 20 MG tablet    Sig: Take 1 tablet (20 mg total) by mouth once a week. INCREASE UP TO ONCE PER DAY AS TOLERATED.    Dispense:  30 tablet    Refill:  1  . clotrimazole (MYCELEX) 10 MG troche    Sig: Take 1 tablet (10 mg total) by mouth 5 (five) times daily.    Dispense:  70 Troche    Refill:  0   Patient Instructions   It is unlikely that farxiga caused prior symptoms. We can restart lower dose if readings are elevated, let me know. I expect to exercise  to also help diabetes.   Continue topical estrogen.   Can try lovastatin initially once per week in place of  Lipitor. Increase as tolerated up ton once per day. If body aches with that as well, I would consider meeting with lipid specialist. Recheck labs in next 3 months as exercise should help as well.   Keep a record of your blood pressures outside of the office and if over 130/80 - let me know.  Patches in mouth may be thrush. Try mycelex troche, but if not continuing to improve, or any worsening return right away.   If you have lab work done today you will be contacted with your lab results within the next 2 weeks.  If you have not heard from [place] then please contact us. The fastest way to get your results is to register for My Chart.   IF you received an x-ray today, you will receive an invoice from [facility]. Please contact [facility] at [phone] with questions or concerns regarding your invoice.   IF you received labwork today, you will receive an invoice from [facility]. Please contact [facility] at [phone] with questions or concerns regarding your invoice.   Our billing staff will not be able to assist you with questions regarding bills from these companies.  You will be contacted with the lab results as soon as they are available. The fastest way to get your results is to activate your My Chart account. Instructions are located on the last page of this paperwork. If you have not heard from [place] regarding the results in 2 weeks, please contact this office.         Signed, Merri Ray, MD [facility]

## 2020-03-16 NOTE — Patient Instructions (Addendum)
It is unlikely that farxiga caused prior symptoms. We can restart lower dose if readings are elevated, let me know. I expect to exercise to also help diabetes.   Continue topical estrogen.   Can try lovastatin initially once per week in place of Lipitor. Increase as tolerated up ton once per day. If body aches with that as well, I would consider meeting with lipid specialist. Recheck labs in next 3 months as exercise should help as well.   Keep a record of your blood pressures outside of the office and if over 130/80 - let me know.  Patches in mouth may be thrush. Try mycelex troche, but if not continuing to improve, or any worsening return right away.   If you have lab work done today you will be contacted with your lab results within the next 2 weeks.  If you have not heard from [place] then please contact us. The fastest way to get your results is to register for My Chart.   IF you received an x-ray today, you will receive an invoice from [facility]. Please contact [facility] at [phone] with questions or concerns regarding your invoice.   IF you received labwork today, you will receive an invoice from [facility]. Please contact [facility] at [phone] with questions or concerns regarding your invoice.   Our billing staff will not be able to assist you with questions regarding bills from these companies.  You will be contacted with the lab results as soon as they are available. The fastest way to get your results is to activate your My Chart account. Instructions are located on the last page of this paperwork. If you have not heard from [place] regarding the results in 2 weeks, please contact this office.

## 2020-03-19 ENCOUNTER — Encounter: Payer: Self-pay | Admitting: Family Medicine

## 2020-03-30 ENCOUNTER — Other Ambulatory Visit: Payer: Self-pay | Admitting: Family Medicine

## 2020-03-30 DIAGNOSIS — E1129 Type 2 diabetes mellitus with other diabetic kidney complication: Secondary | ICD-10-CM

## 2020-03-30 NOTE — Telephone Encounter (Signed)
Requested medications are due for refill today.  unknown  Requested medications are on the active medications list.  yes  Last refill.07/24/2019  Future visit scheduled.   yes  Notes to clinic.  On active med list it says that pt stopped taking this medication on 03/09/2020

## 2020-04-19 ENCOUNTER — Other Ambulatory Visit: Payer: Self-pay | Admitting: Family Medicine

## 2020-04-19 DIAGNOSIS — J302 Other seasonal allergic rhinitis: Secondary | ICD-10-CM

## 2020-04-19 NOTE — Telephone Encounter (Signed)
   Notes to clinic Not a provider we approve rx for.  

## 2020-05-02 ENCOUNTER — Encounter: Payer: Self-pay | Admitting: Family Medicine

## 2020-05-02 NOTE — Telephone Encounter (Signed)
Called patient and LVM with the name of OB she was referred to in 2019.

## 2020-05-04 ENCOUNTER — Other Ambulatory Visit: Payer: Self-pay | Admitting: Family Medicine

## 2020-05-04 DIAGNOSIS — I10 Essential (primary) hypertension: Secondary | ICD-10-CM

## 2020-05-09 ENCOUNTER — Other Ambulatory Visit: Payer: Self-pay | Admitting: Family Medicine

## 2020-05-09 DIAGNOSIS — I10 Essential (primary) hypertension: Secondary | ICD-10-CM

## 2020-05-24 ENCOUNTER — Other Ambulatory Visit: Payer: Self-pay | Admitting: Family Medicine

## 2020-05-24 DIAGNOSIS — E785 Hyperlipidemia, unspecified: Secondary | ICD-10-CM

## 2020-06-08 ENCOUNTER — Other Ambulatory Visit: Payer: Self-pay | Admitting: Family Medicine

## 2020-06-08 DIAGNOSIS — E1141 Type 2 diabetes mellitus with diabetic mononeuropathy: Secondary | ICD-10-CM

## 2020-06-08 DIAGNOSIS — R202 Paresthesia of skin: Secondary | ICD-10-CM

## 2020-06-11 ENCOUNTER — Other Ambulatory Visit: Payer: Self-pay | Admitting: Family Medicine

## 2020-06-11 DIAGNOSIS — E785 Hyperlipidemia, unspecified: Secondary | ICD-10-CM

## 2020-06-24 ENCOUNTER — Other Ambulatory Visit: Payer: Self-pay | Admitting: Family Medicine

## 2020-06-24 DIAGNOSIS — I10 Essential (primary) hypertension: Secondary | ICD-10-CM

## 2020-06-26 LAB — HM MAMMOGRAPHY

## 2020-07-03 ENCOUNTER — Ambulatory Visit: Payer: Self-pay | Admitting: Family Medicine

## 2020-07-20 ENCOUNTER — Other Ambulatory Visit: Payer: Self-pay | Admitting: Family Medicine

## 2020-07-20 DIAGNOSIS — E1129 Type 2 diabetes mellitus with other diabetic kidney complication: Secondary | ICD-10-CM

## 2020-08-06 ENCOUNTER — Other Ambulatory Visit: Payer: Self-pay | Admitting: Family Medicine

## 2020-08-06 DIAGNOSIS — I10 Essential (primary) hypertension: Secondary | ICD-10-CM

## 2020-08-07 ENCOUNTER — Ambulatory Visit: Payer: Self-pay | Admitting: Family Medicine

## 2020-08-10 ENCOUNTER — Encounter: Payer: Self-pay | Admitting: Family Medicine

## 2020-08-10 ENCOUNTER — Ambulatory Visit: Payer: BC Managed Care – PPO | Admitting: Family Medicine

## 2020-08-10 ENCOUNTER — Other Ambulatory Visit: Payer: Self-pay

## 2020-08-10 VITALS — BP 130/76 | HR 63 | Temp 98.1°F | Resp 16 | Ht 62.5 in | Wt 178.6 lb

## 2020-08-10 DIAGNOSIS — E1141 Type 2 diabetes mellitus with diabetic mononeuropathy: Secondary | ICD-10-CM

## 2020-08-10 DIAGNOSIS — I10 Essential (primary) hypertension: Secondary | ICD-10-CM

## 2020-08-10 DIAGNOSIS — R809 Proteinuria, unspecified: Secondary | ICD-10-CM | POA: Diagnosis not present

## 2020-08-10 DIAGNOSIS — E785 Hyperlipidemia, unspecified: Secondary | ICD-10-CM

## 2020-08-10 DIAGNOSIS — E1129 Type 2 diabetes mellitus with other diabetic kidney complication: Secondary | ICD-10-CM | POA: Diagnosis not present

## 2020-08-10 DIAGNOSIS — R202 Paresthesia of skin: Secondary | ICD-10-CM

## 2020-08-10 LAB — COMPREHENSIVE METABOLIC PANEL
ALT: 12 U/L (ref 0–35)
AST: 17 U/L (ref 0–37)
Albumin: 4.2 g/dL (ref 3.5–5.2)
Alkaline Phosphatase: 51 U/L (ref 39–117)
BUN: 10 mg/dL (ref 6–23)
CO2: 31 mEq/L (ref 19–32)
Calcium: 9.1 mg/dL (ref 8.4–10.5)
Chloride: 98 mEq/L (ref 96–112)
Creatinine, Ser: 0.64 mg/dL (ref 0.40–1.20)
GFR: 96.78 mL/min (ref >60.00)
Glucose, Bld: 90 mg/dL (ref 70–99)
Potassium: 4.2 mEq/L (ref 3.5–5.1)
Sodium: 138 mEq/L (ref 135–145)
Total Bilirubin: 0.9 mg/dL (ref 0.2–1.2)
Total Protein: 6.7 g/dL (ref 6.0–8.3)

## 2020-08-10 LAB — HEMOGLOBIN A1C: Hgb A1c MFr Bld: 6.7 % — ABNORMAL HIGH (ref 4.6–6.5)

## 2020-08-10 LAB — LIPID PANEL
Cholesterol: 194 mg/dL (ref 0–200)
HDL: 58 mg/dL (ref >39.00)
LDL Cholesterol: 123 mg/dL — ABNORMAL HIGH (ref 0–99)
NonHDL: 135.92
Total CHOL/HDL Ratio: 3
Triglycerides: 64 mg/dL (ref 0.0–149.0)
VLDL: 12.8 mg/dL (ref 0.0–40.0)

## 2020-08-10 MED ORDER — NIACIN ER (ANTIHYPERLIPIDEMIC) 750 MG PO TBCR: EXTENDED_RELEASE_TABLET | ORAL | 1 refills | Status: DC

## 2020-08-10 MED ORDER — DAPAGLIFLOZIN PROPANEDIOL 10 MG PO TABS: ORAL_TABLET | ORAL | 1 refills | Status: DC

## 2020-08-10 MED ORDER — GABAPENTIN 100 MG PO CAPS: ORAL_CAPSULE | ORAL | 1 refills | Status: DC

## 2020-08-10 MED ORDER — HYDROCHLOROTHIAZIDE 25 MG PO TABS: 25.0000 mg | ORAL_TABLET | Freq: Every day | ORAL | 1 refills | Status: DC

## 2020-08-10 MED ORDER — METFORMIN HCL 1000 MG PO TABS: ORAL_TABLET | ORAL | 1 refills | Status: DC

## 2020-08-10 MED ORDER — LOVASTATIN 20 MG PO TABS: ORAL_TABLET | ORAL | 1 refills | Status: DC

## 2020-08-10 MED ORDER — LISINOPRIL 10 MG PO TABS: ORAL_TABLET | ORAL | 1 refills | Status: DC

## 2020-08-10 NOTE — Patient Instructions (Signed)
No change in medications today.  Depending on A1c can likely follow-up in 6 months for physical but I will let you know if that needs to be checked sooner.  Thanks for coming in today and please let me know if there are questions.

## 2020-08-10 NOTE — Progress Notes (Signed)
Subjective:  Patient ID: Stephanie Nunez, female    DOB: April 16, 1960  Age: 60 y.o. MRN: [id number]  CC:  Chief Complaint  Patient presents with   Hypertension    Pt here today for check up on htn feels okay denies physical sxs pther than headache but notes they are usually unrelated    Hyperlipidemia     Is due for recheck on lipid panel today possible refill needed as well    Diabetes    Pt due for recheck today doing okay no concerns has been trying to maintain diet and level of activity     HPI Stephanie Nunez presents for   Hypertension: HCTZ 25 mg daily, lisinopril 10 mg daily. Rare added salt, less takeout - more cooking at home.  Doing better, had eval of mouth issues with dentist and discussed treatment.  Home readings: none.  BP Readings from Last 3 Encounters:  08/10/20 130/76  03/16/20 138/87  10/04/19 118/80   Lab Results  Component Value Date   CREATININE 0.71 03/10/2020   Hyperlipidemia: Niaspan, Lovastatin 20 mg with co-Q10 supplement. No new side effects or myalgias with combo.  Off stain on labs in 02/2020.  Lab Results  Component Value Date   CHOL 241 (H) 03/10/2020   HDL 61 03/10/2020   LDLCALC 169 (H) 03/10/2020   TRIG 64 03/10/2020   CHOLHDL 4.0 03/10/2020   Lab Results  Component Value Date   ALT 15 03/10/2020   AST 20 03/10/2020   ALKPHOS 68 03/10/2020   BILITOT 0.6 03/10/2020   Diabetes.  With microalbuminuria, neuropathy, treated with metformin 1000 mg twice daily, Farxiga 10 mg daily.  Gabapentin 100 mg nightly for neuropathic symptoms - working well - no breakthrough neuropathic sxs.   She is on ACE inhibitor and statin with Lipitor.  Wilder Glade previously discontinued due to possible side effect concerns, but unlikely cause of those symptoms. Restarted farxiga - tolerating well now.   Home readings fasting: 178, no 200's. No symptomatic lows. Some ice cream this summer.  Home readings postprandial:none.   Lab Results  Component Value  Date   HGBA1C 6.4 (H) 03/10/2020   Wt Readings from Last 3 Encounters:  08/10/20 178 lb 9.6 oz (81 kg)  03/16/20 178 lb 11.2 oz (81.1 kg)  03/09/20 185 lb (83.9 kg)   Improved microalbumin ratio in September 2021 from 42-7.  History Patient Active Problem List   Diagnosis Date Noted   Epiphora 01/05/2016   HTN (hypertension) 02/01/2011   Hyperlipemia 02/01/2011   Obesity (BMI 30-39.9) 02/01/2011   DM type 2 (diabetes mellitus, type 2) ([place]) 02/01/2011   Past Medical History:  Diagnosis Date   Allergy    Anemia    Diabetes type 2, controlled ([place])    Gestational diabetes mellitus    Hyperlipidemia    no meds   Hypertension    Uterine fibroid    Past Surgical History:  Procedure Laterality Date   BREAST SURGERY     fatty tumor under left breast   LIPOMA EXCISION     Left chest wall   LUMBAR SPINE SURGERY     L4-5   Allergies  Allergen Reactions   Simvastatin     myalgia   Statins     Severe musculoskeletal pains.   Prior to Admission medications   Medication Sig Start Date End Date Taking? Authorizing Provider  aspirin 81 MG tablet Take 81 mg by mouth daily.   Yes [provider]  blood glucose meter kit and supplies Dispense based on insurance preference. once daily as directed. E11.9 02/03/19  Yes [name], MD  cholecalciferol (VITAMIN D) 1000 units tablet Take 2,000 Units by mouth daily.   Yes [provider]  conjugated estrogens (PREMARIN) vaginal cream Place 1 Applicatorful vaginally daily. Up to few days per week as needed. 03/09/20  Yes [name], MD  FARXIGA 10 MG TABS tablet TAKE 1 TABLET(10 MG) BY MOUTH DAILY BEFORE BREAKFAST 03/30/20  Yes [name], MD  fluticasone [facility]) 50 MCG/ACT nasal spray SHAKE LIQUID AND USE 1 TO 2 SPRAYS IN EACH NOSTRIL DAILY 05/09/20  Yes [name], MD  gabapentin (NEURONTIN) 100 MG capsule TAKE 1 CAPSULE(100 MG) BY MOUTH EVERY EVENING 06/08/20  Yes [name], MD   hydrochlorothiazide (HYDRODIURIL) 25 MG tablet TAKE 1 TABLET BY MOUTH EVERY DAY 08/07/20  Yes [name], MD  lisinopril (ZESTRIL) 10 MG tablet TAKE 1 TABLET(10 MG) BY MOUTH DAILY 06/26/20  Yes [name], MD  lovastatin (MEVACOR) 20 MG tablet TAKE 1 TABLET BY MOUTH EVERY DAY. INCREASE UP TO EVERY DAY AS TOLERATED 06/13/20  Yes [name], MD  metFORMIN (GLUCOPHAGE) 1000 MG tablet TAKE 1 TABLET(1000 MG) BY MOUTH TWICE DAILY WITH A MEAL 07/21/20  Yes [name], MD  Multiple Vitamin (MULTIVITAMIN) tablet Take 1 tablet by mouth daily.   Yes [provider]  niacin (NIASPAN) 750 MG CR tablet TAKE 2 TABLETS(1500 MG) BY MOUTH AT BEDTIME 05/24/20  Yes [name], MD  [facility] ULTRA test strip USE TO TEST AS DIRECTED ONCE DAILY 08/03/19  Yes [name], MD   Social History   Socioeconomic History   Marital status: Married    Spouse name: Gerald Stabs   Number of children: 1   Years of education: 16   Highest education level: Not on file  Occupational History   Occupation: Control and instrumentation engineer  Tobacco Use   Smoking status: Never   Smokeless tobacco: Never  Substance and Sexual Activity   Alcohol use: Yes    Alcohol/week: 4.0 standard drinks    Types: 4 Glasses of wine per week    Comment: 4 glasses wine a week    Drug use: No   Sexual activity: Yes    Partners: Male    Birth control/protection: Condom  Other Topics Concern   Not on file  Social History Narrative   Lives with her husband (a Software engineer) and their daughter.  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   Respiratory:  Negative for chest tightness and shortness of breath.   Cardiovascular:  Negative for chest pain, palpitations and leg swelling.  Gastrointestinal:  Negative for abdominal pain and blood in stool.  Neurological:  Negative for dizziness, syncope, light-headedness and headaches.    Objective:   Vitals:   08/10/20 1111  BP: 130/76  Pulse: 63  Resp: 16  Temp: 98.1 F (36.7 C)  TempSrc: Temporal  SpO2: 96%  Weight: 178 lb 9.6 oz (81 kg)  Height: 5' 2.5" (1.588 m)  Physical Exam Vitals reviewed.  Constitutional:      Appearance: Normal appearance. She is well-developed.  HENT:     Head: Normocephalic and atraumatic.  Eyes:     Conjunctiva/sclera: Conjunctivae normal.     Pupils: Pupils are equal, round, and reactive to light.  Neck:     Vascular: No carotid bruit.  Cardiovascular:     Rate and Rhythm: Normal rate and regular rhythm.     Heart sounds: Normal heart sounds.  Pulmonary:     Effort: Pulmonary effort is normal.     Breath sounds: Normal breath sounds.  Abdominal:     Palpations: Abdomen is soft. There is no pulsatile mass.     Tenderness: There is no abdominal tenderness.  Musculoskeletal:     Right lower leg: No edema.     Left lower leg: No edema.  Skin:    General: Skin is warm and dry.  Neurological:     Mental Status: She is alert and oriented to person, place, and time.  Psychiatric:        Mood and Affect: Mood normal.        Behavior: Behavior normal.       Assessment & Plan:  Stephanie Nunez is a 60 y.o. female . Type 2 diabetes mellitus with microalbuminuria, without long-term current use of insulin (HCC) - Plan: Hemoglobin A1c, dapagliflozin propanediol (FARXIGA) 10 MG TABS tablet, metFORMIN (GLUCOPHAGE) 1000 MG tablet  -Slight elevated home readings, check A1c, continue same dose metformin, Farxiga for now  Essential hypertension - Plan: Comprehensive metabolic panel, hydrochlorothiazide (HYDRODIURIL) 25 MG tablet,  lisinopril (ZESTRIL) 10 MG tablet  -Borderline.  Monitor sodium/processed food/takeout food and diet.  Continue same regimen for now, check labs  Hyperlipidemia, unspecified hyperlipidemia type - Plan: Lipid panel, lovastatin (MEVACOR) 20 MG tablet, niacin (NIASPAN) 750 MG CR tablet  -Stable, tolerating combination along with co-Q10.  No symptoms of myopathy.  Continue same.  RTC precautions.  Check labs  Tingling of both feet - Plan: gabapentin (NEURONTIN) 100 MG capsule Diabetic mononeuropathy associated with type 2 diabetes mellitus ([place]) - Plan: gabapentin (NEURONTIN) 100 MG capsule  -, Tolerating gabapentin, continue same dose  Meds ordered this encounter  Medications   dapagliflozin propanediol (FARXIGA) 10 MG TABS tablet    Sig: TAKE 1 TABLET(10 MG) BY MOUTH DAILY BEFORE BREAKFAST    Dispense:  90 tablet    Refill:  1   gabapentin (NEURONTIN) 100 MG capsule    Sig: TAKE 1 CAPSULE(100 MG) BY MOUTH EVERY EVENING    Dispense:  90 capsule    Refill:  1   hydrochlorothiazide (HYDRODIURIL) 25 MG tablet    Sig: Take 1 tablet (25 mg total) by mouth daily.    Dispense:  90 tablet    Refill:  1   lisinopril (ZESTRIL) 10 MG tablet    Sig: TAKE 1 TABLET(10 MG) BY MOUTH DAILY    Dispense:  90 tablet    Refill:  1   lovastatin (MEVACOR) 20 MG tablet    Sig: TAKE 1 TABLET BY MOUTH EVERY DAY. INCREASE UP TO EVERY DAY AS TOLERATED    Dispense:  90 tablet    Refill:  1   metFORMIN (GLUCOPHAGE) 1000 MG tablet    Sig: TAKE 1 TABLET(1000 MG) BY MOUTH TWICE DAILY WITH A MEAL    Dispense:  180 tablet    Refill:  1   niacin (NIASPAN) 750 MG CR tablet    Sig: TAKE 2 TABLETS(1500  MG) BY MOUTH AT BEDTIME    Dispense:  180 tablet    Refill:  1   Patient Instructions  No change in medications today.  Depending on A1c can likely follow-up in 6 months for physical but I will let you know if that needs to be checked sooner.  Thanks for coming in today and please let me know if there are questions.       Signed,   Merri Ray, MD [place] 08/10/20 11:50 AM

## 2020-08-11 ENCOUNTER — Other Ambulatory Visit: Payer: Self-pay

## 2020-08-11 DIAGNOSIS — I10 Essential (primary) hypertension: Secondary | ICD-10-CM

## 2020-08-11 MED ORDER — LISINOPRIL 10 MG PO TABS: ORAL_TABLET | ORAL | 1 refills | Status: DC

## 2020-08-11 NOTE — Progress Notes (Signed)
Medication resent

## 2020-09-29 ENCOUNTER — Telehealth: Payer: Self-pay

## 2020-09-29 NOTE — Telephone Encounter (Signed)
Patient needs an appointment to do EKG for pre surgical clearance we were unfortunately not aware of surgery when she was here last and we did not get this information to give to them.   Thank you

## 2020-10-03 NOTE — Telephone Encounter (Signed)
Where does EKG need to be done in office or a different location?

## 2020-10-03 NOTE — Telephone Encounter (Signed)
In office for 20 min EKG appt thank you!

## 2020-10-04 NOTE — Telephone Encounter (Signed)
LVM to schedule a EKG 10/04/20 at 10:26

## 2020-10-14 ENCOUNTER — Other Ambulatory Visit: Payer: Self-pay | Admitting: Family Medicine

## 2020-10-14 DIAGNOSIS — E785 Hyperlipidemia, unspecified: Secondary | ICD-10-CM

## 2020-10-18 ENCOUNTER — Ambulatory Visit: Payer: BC Managed Care – PPO | Admitting: Family Medicine

## 2020-10-18 ENCOUNTER — Other Ambulatory Visit: Payer: Self-pay

## 2020-10-18 VITALS — BP 118/70 | HR 100 | Temp 98.5°F | Resp 16 | Ht 62.5 in | Wt 178.4 lb

## 2020-10-18 DIAGNOSIS — R809 Proteinuria, unspecified: Secondary | ICD-10-CM

## 2020-10-18 DIAGNOSIS — Z01818 Encounter for other preprocedural examination: Secondary | ICD-10-CM

## 2020-10-18 DIAGNOSIS — E1129 Type 2 diabetes mellitus with other diabetic kidney complication: Secondary | ICD-10-CM

## 2020-10-18 DIAGNOSIS — E785 Hyperlipidemia, unspecified: Secondary | ICD-10-CM | POA: Diagnosis not present

## 2020-10-18 DIAGNOSIS — I10 Essential (primary) hypertension: Secondary | ICD-10-CM

## 2020-10-18 NOTE — Patient Instructions (Signed)
I do not have specific concerns that would preclude you from having surgery.  I will make some recommendations on your paperwork, and will wait for results of blood work from today.  Once I have that I can complete your paperwork and send it to Dr. Gaetano Net.  Good luck and let me know if there are questions.

## 2020-10-18 NOTE — Progress Notes (Signed)
Subjective:  Patient ID: Stephanie Nunez, female    DOB: [birth year]  Age: 60 y.o. MRN: [id number]  CC:  Chief Complaint  Patient presents with   Medical Clearance    Pt here for pre surgical pt is having a polyp removed from her vaginal canal     HPI Stephanie Nunez presents for   Preop for hysteroscopy, d and C, possible myosure, Dr. Gaetano Net, Day Surgery, general anesthesia. 11/10/20.   No recent surgery - had back surgery, no anesthesia issues at 60yo.   Activity - weights, stationary bike, elliptical, walking treadmill. No CP/dyspnea with this or walking up 2 flights of stairs of city block.  Over 6 METS.  No hx of TIA/CVA,  No hx of CHF. RCRI/[place] score of 0 -1 (if intraabdominal surgery).   Diabetes: Well contolled on farxiga, metformin.  Lab Results  Component Value Date   HGBA1C 6.7 (H) 08/10/2020   HGBA1C 6.4 (H) 03/10/2020   HGBA1C 6.8 (H) 10/04/2019   Lab Results  Component Value Date   MICROALBUR 1.0 09/09/2015   LDLCALC 123 (H) 08/10/2020   CREATININE 0.64 08/10/2020        History Patient Active Problem List   Diagnosis Date Noted   Epiphora 01/05/2016   HTN (hypertension) 02/01/2011   Hyperlipemia 02/01/2011   Obesity (BMI 30-39.9) 02/01/2011   DM type 2 (diabetes mellitus, type 2) ([place]) 02/01/2011   Past Medical History:  Diagnosis Date   Allergy    Anemia    Diabetes type 2, controlled ([place])    Gestational diabetes mellitus    Hyperlipidemia    no meds   Hypertension    Uterine fibroid    Past Surgical History:  Procedure Laterality Date   BREAST SURGERY     fatty tumor under left breast   LIPOMA EXCISION     Left chest wall   LUMBAR SPINE SURGERY     L4-5   Allergies  Allergen Reactions   Simvastatin     myalgia   Statins     Severe musculoskeletal pains.   Prior to Admission medications   Medication Sig Start Date End Date Taking? Authorizing Provider  aspirin 81 MG tablet Take 81 mg by mouth daily.   Yes [provider]  blood glucose meter kit and supplies Dispense based on insurance preference. once daily as directed. E11.9 02/03/19  Yes [name], MD  cholecalciferol (VITAMIN D) 1000 units tablet Take 2,000 Units by mouth daily.   Yes [provider]  conjugated estrogens (PREMARIN) vaginal cream Place 1 Applicatorful vaginally daily. Up to few days per week as needed. 03/09/20  Yes [name], MD  dapagliflozin propanediol (FARXIGA) 10 MG TABS tablet TAKE 1 TABLET(10 MG) BY MOUTH DAILY BEFORE BREAKFAST 08/10/20  Yes [name], MD  fluticasone [facility]) 50 MCG/ACT nasal spray SHAKE LIQUID AND USE 1 TO 2 SPRAYS IN EACH NOSTRIL DAILY 05/09/20  Yes [name], MD  gabapentin (NEURONTIN) 100 MG capsule TAKE 1 CAPSULE(100 MG) BY MOUTH EVERY EVENING 08/10/20  Yes [name], MD  hydrochlorothiazide (HYDRODIURIL) 25 MG tablet Take 1 tablet (25 mg total) by mouth daily. 08/10/20  Yes [name], MD  lisinopril (ZESTRIL) 10 MG tablet TAKE 1 TABLET(10 MG) BY MOUTH DAILY 08/11/20  Yes [name], MD  lovastatin (MEVACOR) 20 MG tablet TAKE 1 TABLET BY MOUTH EVERY DAY. INCREASE UP TO EVERY DAY AS TOLERATED 10/16/20  Yes [name], MD  metFORMIN (GLUCOPHAGE) 1000 MG tablet TAKE 1 TABLET(1000 MG) BY MOUTH TWICE DAILY WITH A MEAL 08/10/20  Yes [name], MD  Multiple Vitamin (MULTIVITAMIN) tablet Take 1 tablet by mouth daily.   Yes [provider]  niacin (NIASPAN) 750 MG CR tablet TAKE 2 TABLETS(1500 MG) BY MOUTH AT BEDTIME 08/10/20  Yes [name], MD  [facility] ULTRA test strip USE TO TEST AS DIRECTED ONCE DAILY 08/03/19  Yes [name], MD   Social History   Socioeconomic History   Marital status: Married    Spouse name: Gerald Stabs   Number of children: 1   Years of education: 16   Highest education level: Not on file  Occupational History   Occupation: Control and instrumentation engineer  Tobacco Use   Smoking status: Never   Smokeless  tobacco: Never  Substance and Sexual Activity   Alcohol use: Yes    Alcohol/week: 4.0 standard drinks    Types: 4 Glasses of wine per week    Comment: 4 glasses wine a week    Drug use: No   Sexual activity: Yes    Partners: Male    Birth control/protection: Condom  Other Topics Concern   Not on file  Social History Narrative   Lives with her husband (a Software engineer) and their daughter.  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  Constitutional:      Appearance: Normal appearance. She is well-developed.  HENT:     Head: Normocephalic and atraumatic.  Eyes:     Conjunctiva/sclera: Conjunctivae normal.     Pupils: Pupils are equal, round, and reactive to light.  Neck:     Vascular: No carotid bruit.  Cardiovascular:     Rate and Rhythm: Normal rate and regular rhythm.     Heart sounds: Normal heart sounds.  Pulmonary:     Effort: Pulmonary effort is normal.     Breath sounds: Normal breath sounds.  Abdominal:     Palpations: Abdomen is soft. There is no pulsatile mass.     Tenderness: There is no abdominal tenderness.  Musculoskeletal:     Right lower leg: No edema.     Left lower leg: No edema.  Skin:    General: Skin is warm and dry.  Neurological:     Mental Status: She is alert and oriented to person, place, and time.  Psychiatric:        Mood and Affect: Mood normal.        Behavior: Behavior normal.     EKG: Sinus rhythm, rate 84.  No apparent acute ST or T wave changes, nonspecific T wave in lead III.  Flat T waves lateral leads compared to EKG performed on 01/01/2017  33 minutes spent during visit, including chart review, counseling and assimilation of information, exam, ekg review and comparison, discussion  of procedure, discussion of plan, and chart completion.    Assessment & Plan:  Stephanie Nunez is a 60 y.o. female . Encounter for preoperative assessment - Plan: EKG 12-Lead, CBC, Basic metabolic panel Type 2 diabetes mellitus with microalbuminuria, without long-term current use of insulin (HCC) - Plan: CBC, Basic metabolic panel Essential hypertension - Plan: CBC, Basic metabolic panel Hyperlipidemia, unspecified hyperlipidemia type  -No apparent contraindications to surgery or any increased risk of major adverse cardiac event based on RCRI index, history as above.   -check CBC, BMP to evaluate creatinine, screen for anemia.  I will complete paperwork for surgeon once labs back.  Would recommend holding metformin for any contrast studies, maintain hydration with use of lisinopril and other medications, can hold aspirin temporarily if needed as well.    No orders of the defined types were placed in this encounter.  Patient Instructions  I do not have specific concerns that would preclude you from having surgery.  I will make some recommendations on your paperwork, and will wait for results of blood work from today.  Once I have that I can complete your paperwork and send it to Dr. Gaetano Net.  Good luck and let me know if there are questions.    Signed,   Merri Ray, MD [place] 10/18/20 3:55 PM

## 2020-10-19 LAB — CBC
HCT: 42.2 % (ref 36.0–46.0)
Hemoglobin: 14.2 g/dL (ref 12.0–15.0)
MCHC: 33.6 g/dL (ref 30.0–36.0)
MCV: 94.2 fl (ref 78.0–100.0)
Platelets: 409 10*3/uL — ABNORMAL HIGH (ref 150.0–400.0)
RBC: 4.48 Mil/uL (ref 3.87–5.11)
RDW: 13 % (ref 11.5–15.5)
WBC: 4.2 10*3/uL (ref 4.0–10.5)

## 2020-10-19 LAB — BASIC METABOLIC PANEL
BUN: 13 mg/dL (ref 6–23)
CO2: 33 mEq/L — ABNORMAL HIGH (ref 19–32)
Calcium: 9.5 mg/dL (ref 8.4–10.5)
Chloride: 96 mEq/L (ref 96–112)
Creatinine, Ser: 0.78 mg/dL (ref 0.40–1.20)
GFR: 83.07 mL/min (ref >60.00)
Glucose, Bld: 141 mg/dL — ABNORMAL HIGH (ref 70–99)
Potassium: 3.8 mEq/L (ref 3.5–5.1)
Sodium: 137 mEq/L (ref 135–145)

## 2020-10-20 ENCOUNTER — Ambulatory Visit: Payer: BC Managed Care – PPO | Admitting: Family Medicine

## 2020-10-26 ENCOUNTER — Telehealth: Payer: Self-pay | Admitting: Family Medicine

## 2020-10-26 NOTE — Telephone Encounter (Signed)
Caller name:Mary  On DPR? :yes/no: No  Call back number: [phone]  Provider they see: Carlota Raspberry  Reason for call: Mary from [facility] called to check on the preop form. I let her know that I would get his assistant to call her back

## 2020-10-26 NOTE — Telephone Encounter (Signed)
Spoke to [place], she stated patient did not pass preop exam and patient was informed of that. I called Stanton Kidney back to let her know. Also let Noah Delaine know that she was not scheduled for a follow up to do another preop exam. Sent message to [facility] to let her know to reach out to patient and follow up to make sure she understands she did not pass and needed another visit.

## 2020-10-27 NOTE — Telephone Encounter (Signed)
This has been reviewd and blood work has come back forms have been filled out and faxed back to physicians

## 2020-11-01 ENCOUNTER — Telehealth: Payer: Self-pay

## 2020-11-01 NOTE — Telephone Encounter (Signed)
This was a mistake on my part as I told Claiborne Billings the patient had more work up before she could be passed and this was not the case once I looked deeper in her chart and found we were only waiting for blood work, I had called and left a message and faxed the signed clearance to their office and spoke with Ms Pasqua about her clearance being approved, I will call the surgical office again today and clear up this error.

## 2020-11-01 NOTE — Telephone Encounter (Signed)
Dr Carlota Raspberry, Please advise

## 2020-11-01 NOTE — Telephone Encounter (Signed)
Caller name:Elenore Sarina Ill   On DPR? :Yes  Call back number:[phone]  Provider they see: Carlota Raspberry   Reason for call: Pt is calling and wanted to find out what is wrong why she was not cleared for surgery. Dr Gaetano Net was the surgical doctor?

## 2020-11-01 NOTE — Telephone Encounter (Signed)
Spoke to [place] today who helped clear up this issue, I am refaxing the forms as they had not received the EKG and part of the labs from previous fax and then Stephanie Nunez is set for her surgery. I called Stephanie Nunez to let her know she did not answer but I left a message stating this was all cleared up and she can call the office and speak with me if any other issues arose.   Fax re sent 2:55pm

## 2020-11-02 ENCOUNTER — Encounter (HOSPITAL_BASED_OUTPATIENT_CLINIC_OR_DEPARTMENT_OTHER): Payer: Self-pay | Admitting: Obstetrics and Gynecology

## 2020-11-06 ENCOUNTER — Other Ambulatory Visit: Payer: Self-pay | Admitting: Family Medicine

## 2020-11-06 DIAGNOSIS — I10 Essential (primary) hypertension: Secondary | ICD-10-CM

## 2020-11-08 ENCOUNTER — Other Ambulatory Visit: Payer: Self-pay

## 2020-11-08 ENCOUNTER — Encounter (HOSPITAL_BASED_OUTPATIENT_CLINIC_OR_DEPARTMENT_OTHER): Payer: Self-pay | Admitting: Obstetrics and Gynecology

## 2020-11-08 ENCOUNTER — Encounter (HOSPITAL_COMMUNITY)
Admission: RE | Admit: 2020-11-08 | Discharge: 2020-11-08 | Disposition: A | Discharge status: Home / Self Care | Payer: BC Managed Care – PPO | Source: Ambulatory Visit | Attending: Obstetrics and Gynecology | Admitting: Obstetrics and Gynecology

## 2020-11-08 DIAGNOSIS — Z888 Allergy status to other drugs, medicaments and biological substances status: Secondary | ICD-10-CM | POA: Diagnosis not present

## 2020-11-08 DIAGNOSIS — N841 Polyp of cervix uteri: Secondary | ICD-10-CM

## 2020-11-08 DIAGNOSIS — Z833 Family history of diabetes mellitus: Secondary | ICD-10-CM | POA: Diagnosis not present

## 2020-11-08 DIAGNOSIS — Z823 Family history of stroke: Secondary | ICD-10-CM | POA: Diagnosis not present

## 2020-11-08 DIAGNOSIS — E119 Type 2 diabetes mellitus without complications: Secondary | ICD-10-CM | POA: Diagnosis not present

## 2020-11-08 DIAGNOSIS — Z8249 Family history of ischemic heart disease and other diseases of the circulatory system: Secondary | ICD-10-CM | POA: Diagnosis not present

## 2020-11-08 DIAGNOSIS — Z800 Family history of malignant neoplasm of digestive organs: Secondary | ICD-10-CM | POA: Diagnosis not present

## 2020-11-08 DIAGNOSIS — Z7984 Long term (current) use of oral hypoglycemic drugs: Secondary | ICD-10-CM | POA: Diagnosis not present

## 2020-11-08 DIAGNOSIS — N840 Polyp of corpus uteri: Secondary | ICD-10-CM | POA: Diagnosis present

## 2020-11-08 DIAGNOSIS — Z01812 Encounter for preprocedural laboratory examination: Secondary | ICD-10-CM | POA: Insufficient documentation

## 2020-11-08 LAB — BASIC METABOLIC PANEL
Anion gap: 10 (ref 5–15)
BUN: 12 mg/dL (ref 6–20)
CO2: 28 mmol/L (ref 22–32)
Calcium: 9.3 mg/dL (ref 8.9–10.3)
Chloride: 100 mmol/L (ref 98–111)
Creatinine, Ser: 0.55 mg/dL (ref 0.44–1.00)
GFR, Estimated: >60 mL/min (ref >60)
Glucose, Bld: 126 mg/dL — ABNORMAL HIGH (ref 70–99)
Potassium: 3.2 mmol/L — ABNORMAL LOW (ref 3.5–5.1)
Sodium: 138 mmol/L (ref 135–145)

## 2020-11-08 LAB — CBC
HCT: 43.6 % (ref 36.0–46.0)
Hemoglobin: 14.5 g/dL (ref 12.0–15.0)
MCH: 31.7 pg (ref 26.0–34.0)
MCHC: 33.3 g/dL (ref 30.0–36.0)
MCV: 95.2 fL (ref 80.0–100.0)
Platelets: 373 10*3/uL (ref 150–400)
RBC: 4.58 MIL/uL (ref 3.87–5.11)
RDW: 12.5 % (ref 11.5–15.5)
WBC: 5.1 10*3/uL (ref 4.0–10.5)
nRBC: 0 % (ref 0.0–0.2)

## 2020-11-08 NOTE — H&P (Signed)
Stephanie Nunez is an 60 y.o. female. On exam she has an endocervical polyp with a thick stalk projecting 3-4 cm beyond external cervical os. Denies bleeding and pain. U/S in office notes a polyp arising from upper cervical canal. Saline would not pass into EM cavity. Multiple fibroids 4.1, 3.9 and 1.9 cm as well as a 6.0 cm subserosal vs pedunculated fibroid. Unable to visualize ovaries.  Pertinent Gynecological History: Menses: post-menopausal Bleeding:  Contraception: none DES exposure: denies Blood transfusions: none Sexually transmitted diseases: no past history Previous GYN Procedures:  Last mammogram: normal Date: 6/22 Last pap: normal Date: 7/22 OB History: G1, P1   Menstrual History: Menarche age: unknown73 yo with  No LMP recorded. Patient is postmenopausal.    Past Medical History:  Diagnosis Date   Allergic rhinitis    Cervical polyp    History of gestational diabetes mellitus    Hyperlipidemia    no meds   Hypertension    Type 2 diabetes mellitus ([facility])    followed by pcp---  (11-08-2020  pt stated check blood sugar once weekly, fasting sugar -- 145)   Uterine fibroid    Wears glasses     Past Surgical History:  Procedure Laterality Date   COLONOSCOPY  12/06/2015   by stark   LIPOMA EXCISION  1990   Left chest wall   LUMBAR SPINE SURGERY  1992   L4-5    Family History  Problem Relation Age of Onset   Cancer Father        pancreatic   Pancreatic cancer Father    Diabetes Sister    Hypertension Sister    Hypertension Sister    Hypertension Brother    Stroke Maternal Grandmother    Stroke Paternal Grandmother    Colon cancer Neg Hx    Colon polyps Neg Hx    Esophageal cancer Neg Hx    Rectal cancer Neg Hx    Stomach cancer Neg Hx     Social History:  reports that she has never smoked. She has never used smokeless tobacco. She reports that she does not currently use alcohol. She reports that she does not use drugs.  Allergies:  Allergies   Allergen Reactions   Simvastatin     myalgia   Statins     Severe musculoskeletal pains.    No medications prior to admission.    Review of Systems  Constitutional:  Negative for fever.   There were no vitals taken for this visit. Physical Exam Cardiovascular:     Rate and Rhythm: Normal rate.  Pulmonary:     Effort: Pulmonary effort is normal.  Genitourinary:    Comments: Endocervical polyp with a thick stalk extending 3-4 cm beyond external cervical os   Results for orders placed or performed during the hospital encounter of 11/08/20 (from the past 24 hour(s))  Type and screen [facility]     Status: None   Collection Time: 11/08/20  2:43 PM  Result Value Ref Range   ABO/RH(D) O POS    Antibody Screen NEG    Sample Expiration 11/22/2020,2359    Extend sample reason      NO TRANSFUSIONS OR PREGNANCY IN THE PAST 3 MONTHS Performed at [facility], [place] [address] [place], [place] [address]   CBC     Status: None   Collection Time: 11/08/20  2:43 PM  Result Value Ref Range   WBC 5.1 4.0 - 10.5 K/uL   RBC 4.58  3.87 - 5.11 MIL/uL   Hemoglobin 14.5 12.0 - 15.0 g/dL   HCT 43.6 36.0 - 46.0 %   MCV 95.2 80.0 - 100.0 fL   MCH 31.7 26.0 - 34.0 pg   MCHC 33.3 30.0 - 36.0 g/dL   RDW 12.5 11.5 - 15.5 %   Platelets 373 150 - 400 K/uL   nRBC 0.0 0.0 - 0.2 %  Basic metabolic panel     Status: Abnormal   Collection Time: 11/08/20  2:43 PM  Result Value Ref Range   Sodium 138 135 - 145 mmol/L   Potassium 3.2 (L) 3.5 - 5.1 mmol/L   Chloride 100 98 - 111 mmol/L   CO2 28 22 - 32 mmol/L   Glucose, Bld 126 (H) 70 - 99 mg/dL   BUN 12 6 - 20 mg/dL   Creatinine, Ser 0.55 0.44 - 1.00 mg/dL   Calcium 9.3 8.9 - 10.3 mg/dL   GFR, Estimated >60 >60 mL/min   Anion gap 10 5 - 15    No results found.  Assessment/Plan: 60 yo with thick endocervical polyp D/W H/S, D&C and possible Myosure resection Risks reviewed including infection, uterine  perforation and organ damage, bleeding/transfusion-HIV/Hep, DVT/PE, pneumonia. She states she understands and agrees.  Shon Millet II 11/08/2020, 6:03 PM

## 2020-11-08 NOTE — Progress Notes (Signed)
Spoke w/ via phone for pre-op interview--- pt Lab needs dos----  no             Lab results------ pt had lab work done today CBC/ BMP/ T&S results in epic;  current ekg in epic COVID test -----patient states asymptomatic no test needed Arrive at ------- 0530 on 11-10-2020 NPO after MN NO Solid Food.  Clear liquids from MN until--- 0430 Med rec completed Medications to take morning of surgery ----- flonase spray Diabetic medication ----- do not take farxiga/ metformin morning of surgery Patient instructed no nail polish to be worn day of surgery Patient instructed to bring photo id and insurance card day of surgery Patient aware to have Driver (ride ) / caregiver for 24 hours after surgery --husband, Stephanie Nunez Patient Special Instructions ----- n/a Pre-Op special Istructions ----- n/a Patient verbalized understanding of instructions that were given at this phone interview. Patient denies shortness of breath, chest pain, fever, cough at this phone interview.

## 2020-11-10 ENCOUNTER — Ambulatory Visit (HOSPITAL_BASED_OUTPATIENT_CLINIC_OR_DEPARTMENT_OTHER)
Admission: RE | Admit: 2020-11-10 | Discharge: 2020-11-10 | Disposition: A | Discharge status: Home / Self Care | Payer: BC Managed Care – PPO | Attending: Obstetrics and Gynecology | Admitting: Obstetrics and Gynecology

## 2020-11-10 ENCOUNTER — Ambulatory Visit (HOSPITAL_BASED_OUTPATIENT_CLINIC_OR_DEPARTMENT_OTHER): Payer: BC Managed Care – PPO | Admitting: Anesthesiology

## 2020-11-10 ENCOUNTER — Encounter (HOSPITAL_BASED_OUTPATIENT_CLINIC_OR_DEPARTMENT_OTHER)
Admission: RE | Disposition: A | Discharge status: Home / Self Care | Payer: Self-pay | Source: Home / Self Care | Attending: Obstetrics and Gynecology

## 2020-11-10 ENCOUNTER — Encounter (HOSPITAL_BASED_OUTPATIENT_CLINIC_OR_DEPARTMENT_OTHER): Payer: Self-pay | Admitting: Obstetrics and Gynecology

## 2020-11-10 ENCOUNTER — Other Ambulatory Visit: Payer: Self-pay

## 2020-11-10 DIAGNOSIS — E119 Type 2 diabetes mellitus without complications: Secondary | ICD-10-CM | POA: Insufficient documentation

## 2020-11-10 DIAGNOSIS — Z823 Family history of stroke: Secondary | ICD-10-CM | POA: Insufficient documentation

## 2020-11-10 DIAGNOSIS — Z7984 Long term (current) use of oral hypoglycemic drugs: Secondary | ICD-10-CM | POA: Insufficient documentation

## 2020-11-10 DIAGNOSIS — N841 Polyp of cervix uteri: Secondary | ICD-10-CM | POA: Insufficient documentation

## 2020-11-10 DIAGNOSIS — Z800 Family history of malignant neoplasm of digestive organs: Secondary | ICD-10-CM | POA: Insufficient documentation

## 2020-11-10 DIAGNOSIS — Z888 Allergy status to other drugs, medicaments and biological substances status: Secondary | ICD-10-CM | POA: Insufficient documentation

## 2020-11-10 DIAGNOSIS — N840 Polyp of corpus uteri: Secondary | ICD-10-CM | POA: Insufficient documentation

## 2020-11-10 DIAGNOSIS — Z8249 Family history of ischemic heart disease and other diseases of the circulatory system: Secondary | ICD-10-CM | POA: Insufficient documentation

## 2020-11-10 DIAGNOSIS — Z833 Family history of diabetes mellitus: Secondary | ICD-10-CM | POA: Insufficient documentation

## 2020-11-10 HISTORY — PX: DILATATION & CURETTAGE/HYSTEROSCOPY WITH MYOSURE: SHX6511

## 2020-11-10 HISTORY — DX: Polyp of cervix uteri: N84.1

## 2020-11-10 HISTORY — DX: Personal history of gestational diabetes: Z86.32

## 2020-11-10 HISTORY — DX: Type 2 diabetes mellitus without complications: E11.9

## 2020-11-10 HISTORY — DX: Presence of spectacles and contact lenses: Z97.3

## 2020-11-10 HISTORY — DX: Allergic rhinitis, unspecified: J30.9

## 2020-11-10 LAB — GLUCOSE, CAPILLARY
Glucose-Capillary: 113 mg/dL — ABNORMAL HIGH (ref 70–99)
Glucose-Capillary: 101 mg/dL — ABNORMAL HIGH (ref 70–99)

## 2020-11-10 LAB — TYPE AND SCREEN
ABO/RH(D): O POS
Antibody Screen: NEGATIVE

## 2020-11-10 LAB — ABO/RH: ABO/RH(D): O POS

## 2020-11-10 SURGERY — DILATATION & CURETTAGE/HYSTEROSCOPY WITH MYOSURE
Anesthesia: General | Site: Vagina

## 2020-11-10 MED ORDER — KETOROLAC TROMETHAMINE 30 MG/ML IJ SOLN
INTRAMUSCULAR | Status: DC | PRN
  Administered 2020-11-10: 30 mg via INTRAVENOUS

## 2020-11-10 MED ORDER — LIDOCAINE HCL (CARDIAC) PF 100 MG/5ML IV SOSY
PREFILLED_SYRINGE | INTRAVENOUS | Status: DC | PRN
  Administered 2020-11-10: 60 mg via INTRAVENOUS

## 2020-11-10 MED ORDER — LACTATED RINGERS IV SOLN: INTRAVENOUS | Status: DC

## 2020-11-10 MED ORDER — LIDOCAINE HCL 1 % IJ SOLN
INTRAMUSCULAR | Status: DC | PRN
  Administered 2020-11-10: 16 mL

## 2020-11-10 MED ORDER — PROPOFOL 10 MG/ML IV BOLUS
INTRAVENOUS | Status: DC | PRN
  Administered 2020-11-10: 180 mg via INTRAVENOUS

## 2020-11-10 MED ORDER — DEXAMETHASONE SODIUM PHOSPHATE 4 MG/ML IJ SOLN
INTRAMUSCULAR | Status: DC | PRN
  Administered 2020-11-10: 10 mg via INTRAVENOUS

## 2020-11-10 MED ORDER — SOD CITRATE-CITRIC ACID 500-334 MG/5ML PO SOLN: 30.0000 mL | ORAL | Status: DC

## 2020-11-10 MED ORDER — MIDAZOLAM HCL 2 MG/2ML IJ SOLN
INTRAMUSCULAR | Status: AC
  Filled 2020-11-10: qty 2

## 2020-11-10 MED ORDER — FENTANYL CITRATE (PF) 100 MCG/2ML IJ SOLN
INTRAMUSCULAR | Status: DC | PRN
  Administered 2020-11-10 (×4): 25 ug via INTRAVENOUS

## 2020-11-10 MED ORDER — ONDANSETRON HCL 4 MG/2ML IJ SOLN
INTRAMUSCULAR | Status: DC | PRN
  Administered 2020-11-10: 4 mg via INTRAVENOUS

## 2020-11-10 MED ORDER — POVIDONE-IODINE 10 % EX SWAB: 2.0000 "application " | Freq: Once | CUTANEOUS | Status: DC

## 2020-11-10 MED ORDER — CEFAZOLIN SODIUM-DEXTROSE 2-4 GM/100ML-% IV SOLN
2.0000 g | INTRAVENOUS | Status: AC
  Administered 2020-11-10: 2 g via INTRAVENOUS

## 2020-11-10 MED ORDER — KETOROLAC TROMETHAMINE 30 MG/ML IJ SOLN
INTRAMUSCULAR | Status: AC
  Filled 2020-11-10: qty 1

## 2020-11-10 MED ORDER — ACETAMINOPHEN 500 MG PO TABS: 1000.0000 mg | ORAL_TABLET | Freq: Once | ORAL | Status: DC

## 2020-11-10 MED ORDER — FENTANYL CITRATE (PF) 100 MCG/2ML IJ SOLN: 25.0000 ug | INTRAMUSCULAR | Status: DC | PRN

## 2020-11-10 MED ORDER — PROPOFOL 10 MG/ML IV BOLUS
INTRAVENOUS | Status: AC
  Filled 2020-11-10: qty 40

## 2020-11-10 MED ORDER — CEFAZOLIN SODIUM-DEXTROSE 2-4 GM/100ML-% IV SOLN
INTRAVENOUS | Status: AC
  Filled 2020-11-10: qty 100

## 2020-11-10 MED ORDER — MIDAZOLAM HCL 5 MG/5ML IJ SOLN
INTRAMUSCULAR | Status: DC | PRN
  Administered 2020-11-10: 2 mg via INTRAVENOUS

## 2020-11-10 MED ORDER — SODIUM CHLORIDE 0.9 % IR SOLN
Status: DC | PRN
  Administered 2020-11-10: 3000 mL

## 2020-11-10 MED ORDER — ONDANSETRON HCL 4 MG/2ML IJ SOLN
INTRAMUSCULAR | Status: AC
  Filled 2020-11-10: qty 2

## 2020-11-10 MED ORDER — FENTANYL CITRATE (PF) 100 MCG/2ML IJ SOLN
INTRAMUSCULAR | Status: AC
  Filled 2020-11-10: qty 2

## 2020-11-10 MED ORDER — SCOPOLAMINE 1 MG/3DAYS TD PT72: 1.0000 | MEDICATED_PATCH | TRANSDERMAL | Status: DC

## 2020-11-10 MED ORDER — AMISULPRIDE (ANTIEMETIC) 5 MG/2ML IV SOLN: 10.0000 mg | Freq: Once | INTRAVENOUS | Status: DC | PRN

## 2020-11-10 MED ORDER — DEXAMETHASONE SODIUM PHOSPHATE 10 MG/ML IJ SOLN
INTRAMUSCULAR | Status: AC
  Filled 2020-11-10: qty 1

## 2020-11-10 MED ORDER — LIDOCAINE 2% (20 MG/ML) 5 ML SYRINGE
INTRAMUSCULAR | Status: AC
  Filled 2020-11-10: qty 5

## 2020-11-10 SURGICAL SUPPLY — 16 items
CATH ROBINSON RED A/P 16FR (CATHETERS) ×2 IMPLANT
DEVICE MYOSURE REACH (MISCELLANEOUS) ×2 IMPLANT
DRSG TELFA 3X8 NADH (GAUZE/BANDAGES/DRESSINGS) ×2 IMPLANT
GAUZE 4X4 16PLY ~~LOC~~+RFID DBL (SPONGE) ×2 IMPLANT
GLOVE SURG ENC MOIS LTX SZ8 (GLOVE) ×2 IMPLANT
GLOVE SURG POLYISO LF SZ6.5 (GLOVE) ×2 IMPLANT
GLOVE SURG UNDER POLY LF SZ7 (GLOVE) ×2 IMPLANT
GOWN STRL REUS W/TWL LRG LVL3 (GOWN DISPOSABLE) ×4 IMPLANT
IV NS IRRIG 3000ML ARTHROMATIC (IV SOLUTION) ×2 IMPLANT
KIT PROCEDURE FLUENT (KITS) ×2 IMPLANT
KIT TURNOVER CYSTO (KITS) ×2 IMPLANT
PACK VAGINAL MINOR WOMEN LF (CUSTOM PROCEDURE TRAY) ×2 IMPLANT
PAD OB MATERNITY 4.3X12.25 (PERSONAL CARE ITEMS) ×2 IMPLANT
PAD PREP 24X48 CUFFED NSTRL (MISCELLANEOUS) ×2 IMPLANT
SEAL ROD LENS SCOPE MYOSURE (ABLATOR) ×2 IMPLANT
TOWEL OR 17X26 10 PK STRL BLUE (TOWEL DISPOSABLE) ×2 IMPLANT

## 2020-11-10 NOTE — Discharge Instructions (Addendum)
Call office and schedule a follow up visit for one week post-op.    No ibuprofen, Advil, Aleve, Motrin, ketorolac, meloxicam, naproxen, or other NSAIDS until after 2:00pm today if needed for pain.     DISCHARGE INSTRUCTIONS: D&C  The following instructions have been prepared to help you care for yourself upon your return home.   Personal hygiene:  Use sanitary pads for vaginal drainage, not tampons.  Shower the day after your procedure.  NO tub baths, pools or Jacuzzis for 2-3 weeks.  Wipe front to back after using the bathroom.  Activity and limitations:  Do NOT drive or operate any equipment for 24 hours. The effects of anesthesia are still present and drowsiness may result.  Do NOT rest in bed all day.  Walking is encouraged.  Walk up and down stairs slowly.  You may resume your normal activity in one to two days or as indicated by your physician.  Sexual activity: NO intercourse for at least 2 weeks after the procedure, or as indicated by your physician.  Diet: Eat a light meal as desired this evening. You may resume your usual diet tomorrow.  Return to work: You may resume your work activities in one to two days or as indicated by your doctor.  What to expect after your surgery: Expect to have vaginal bleeding/discharge for 2-3 days and spotting for up to 10 days. It is not unusual to have soreness for up to 1-2 weeks. You may have a slight burning sensation when you urinate for the first day. Mild cramps may continue for a couple of days. You may have a regular period in 2-6 weeks.  Call your doctor for any of the following:  Excessive vaginal bleeding, saturating and changing one pad every hour.  Inability to urinate 6 hours after discharge from hospital.  Pain not relieved by pain medication.  Fever of 100.4 F or greater.  Unusual vaginal discharge or odor.            Post Anesthesia Home Care Instructions  Activity: Get plenty of rest for the remainder of  the day. A responsible individual must stay with you for 24 hours following the procedure.  For the next 24 hours, DO NOT: -Drive a car -Paediatric nurse -Drink alcoholic beverages -Take any medication unless instructed by your physician -Make any legal decisions or sign important papers.  Meals: Start with liquid foods such as gelatin or soup. Progress to regular foods as tolerated. Avoid greasy, spicy, heavy foods. If nausea and/or vomiting occur, drink only clear liquids until the nausea and/or vomiting subsides. Call your physician if vomiting continues.  Special Instructions/Symptoms: Your throat may feel dry or sore from the anesthesia or the breathing tube placed in your throat during surgery. If this causes discomfort, gargle with warm salt water. The discomfort should disappear within 24 hours.

## 2020-11-10 NOTE — Anesthesia Preprocedure Evaluation (Signed)
Anesthesia Evaluation  Patient identified by MRN, date of birth, ID band Patient awake    Reviewed: Allergy & Precautions, NPO status , Patient's Chart, lab work & pertinent test results  Airway Mallampati: II  TM Distance: >3 FB Neck ROM: Full    Dental  (+) Dental Advisory Given   Pulmonary neg pulmonary ROS,    breath sounds clear to auscultation       Cardiovascular hypertension, Pt. on medications  Rhythm:Regular Rate:Normal     Neuro/Psych negative neurological ROS     GI/Hepatic negative GI ROS, Neg liver ROS,   Endo/Other  diabetes, Type 2, Oral Hypoglycemic Agents  Renal/GU negative Renal ROS     Musculoskeletal   Abdominal   Peds  Hematology negative hematology ROS (+)   Anesthesia Other Findings   Reproductive/Obstetrics                             Anesthesia Physical Anesthesia Plan  ASA: 2  Anesthesia Plan: General   Post-op Pain Management:    Induction: Intravenous  PONV Risk Score and Plan: 4 or greater and Dexamethasone, Ondansetron, Midazolam, Treatment may vary due to age or medical condition and Scopolamine patch - Pre-op  Airway Management Planned: LMA  Additional Equipment:   Intra-op Plan:   Post-operative Plan: Extubation in OR  Informed Consent: I have reviewed the patients History and Physical, chart, labs and discussed the procedure including the risks, benefits and alternatives for the proposed anesthesia with the patient or authorized representative who has indicated his/her understanding and acceptance.     Dental advisory given  Plan Discussed with: CRNA  Anesthesia Plan Comments:         Anesthesia Quick Evaluation

## 2020-11-10 NOTE — Anesthesia Postprocedure Evaluation (Signed)
Anesthesia Post Note  Patient: Stephanie Nunez  Procedure(s) Performed: DILATATION & CURETTAGE/HYSTEROSCOPY WITH  MYOSURE (Vagina )     Patient location during evaluation: PACU Anesthesia Type: General Level of consciousness: awake and alert Pain management: pain level controlled Vital Signs Assessment: post-procedure vital signs reviewed and stable Respiratory status: spontaneous breathing, nonlabored ventilation, respiratory function stable and patient connected to nasal cannula oxygen Cardiovascular status: blood pressure returned to baseline and stable Postop Assessment: no apparent nausea or vomiting Anesthetic complications: no   No notable events documented.  Last Vitals:  Vitals:   11/10/20 0830 11/10/20 0900  BP: 101/90 112/79  Pulse: 71 67  Resp: 17 16  Temp: (!) 36.3 C 36.4 C  SpO2: 99% 96%    Last Pain:  Vitals:   11/10/20 0900  TempSrc:   PainSc: 0-No pain                 Tiajuana Amass

## 2020-11-10 NOTE — Op Note (Signed)
NAMELYLLIE, COBBINS MEDICAL RECORD NO: [id number] ACCOUNT NO: [id number] DATE OF BIRTH: April 23, 1960 FACILITY: [facility] LOCATION: [facility] PHYSICIAN: Daleen Bo. Lyn Hollingshead, MD  Operative Report   DATE OF PROCEDURE: 11/10/2020   PREOPERATIVE DIAGNOSIS:  Endocervical polyp.  POSTOPERATIVE DIAGNOSIS:  Endocervical polyp and endometrial polyp.  PROCEDURES:  Hysteroscopy, dilation and curettage and MyoSure resection.  SURGEON:  Daleen Bo. Lyn Hollingshead, MD  ANESTHESIA:  General with LMA, Suzette Battiest, M.D.  ESTIMATED BLOOD LOSS:  30 mL.   SPECIMENS:  Endocervical polyp, endometrial curettings and endometrial resection, all to pathology.  INDICATIONS AND CONSENT:  This patient is a 60 year old menopausal patient who on examination was noted to have a polyp protruding about 3 cm from the external cervical os.  The stalk was noted to be quite thick and there was a concern over potential  bleeding in the office if it was removed there.  Ultrasound with sonohysterogram was unable to instill saline into the endometrial cavity, but the stalk of the polyp could be seen traversing the entire length of the cervical canal.  Hysteroscopy,  dilation and curettage, possible MyoSure resection has been discussed.  Potential risks and complications were discussed preoperatively including but not limited to infection, organ damage, bleeding requiring transfusion of blood products with HIV and  hepatitis acquisition, DVT, PE, pneumonia and possibly recurrent polyps.  She states she understands and agrees and consent is signed on the chart.  FINDINGS: Both fallopian tube ostia are noted.  There is a 1.5 cm filmy pedunculated polyp in the posterior upper endometrial cavity.  The polyp recognized in the office is also noted protruding from the cervix with the base of the stalk at the  endocervical os.  PROCEDURE: The patient was taken to the operating room where she is identified, placed in the dorsal supine  position and general anesthesia was induced via LMA.  She was placed in the dorsal lithotomy position.  She was prepped and draped in sterile  fashion.  Timeout undertaken.  Bladder straight catheterized.  Bivalve speculum was placed and the anterior cervical lip was injected with 1% lidocaine and grasped with a single tooth tenaculum.  Paracervical block was placed at 2, 4, 5, 7, 8 and 10  o'clock positions with approximately 17 mL total.  Cervix was grasped with a single tooth tenaculum.  The hysteroscope is placed beside the polyp and advanced along the course of the endocervical canal using direct visualization with distending media.   The base of the polyp can be seen at the level of the internal cervical os, but the endometrial cavity could not be readily entered.  Therefore, the hysteroscope was removed and ring clamps were used to remove the endocervical polyp with a twisting  motion, which removes it readily.  Good hemostasis was noted.  The endocervical os was then gently progressively dilated.  Hysteroscope was then again advanced under direct visualization with distending media and the endometrial cavity was entered.  The  above findings were noted.  The cavity holds good distention with the fluid.  The MyoSure device was then used to resect the above noted polyp.  This was then removed and gentle curettage was done for scant tissue.  Instruments were removed.  Good  hemostasis was noted.  All counts were correct.  The patient was awakened and taken to recovery room in stable condition.     Elián.Darby D: 11/10/2020 8:00:43 am T: 11/10/2020 9:23:00 am  JOB: [id number]/ [id number]

## 2020-11-10 NOTE — Transfer of Care (Signed)
Immediate Anesthesia Transfer of Care Note  Patient: Stephanie Nunez  Procedure(s) Performed: Procedure(s) (LRB): DILATATION & CURETTAGE/HYSTEROSCOPY WITH  MYOSURE (N/A)  Patient Location: PACU  Anesthesia Type: General  Level of Consciousness: awake, sedated, patient cooperative and responds to stimulation  Airway & Oxygen Therapy: Patient Spontanous Breathing and Patient connected to [place] 02 and soft FM   Post-op Assessment: Report given to PACU RN, Post -op Vital signs reviewed and stable and Patient moving all extremities  Post vital signs: Reviewed and stable  Complications: No apparent anesthesia complications

## 2020-11-10 NOTE — Anesthesia Procedure Notes (Signed)
Procedure Name: LMA Insertion Date/Time: 11/10/2020 7:29 AM Performed by: Justice Rocher, CRNA Pre-anesthesia Checklist: Patient identified, Emergency Drugs available, Suction available, Patient being monitored and Timeout performed Patient Re-evaluated:Patient Re-evaluated prior to induction Oxygen Delivery Method: Circle system utilized Preoxygenation: Pre-oxygenation with 100% oxygen Induction Type: IV induction Ventilation: Mask ventilation without difficulty LMA: LMA inserted LMA Size: 4.0 Number of attempts: 1 Airway Equipment and Method: Bite block Placement Confirmation: positive ETCO2, breath sounds checked- equal and bilateral and CO2 detector Tube secured with: Tape Dental Injury: Teeth and Oropharynx as per pre-operative assessment

## 2020-11-10 NOTE — Progress Notes (Signed)
11/10/2020  7:54 AM  PATIENT:  Stephanie Nunez  60 y.o. female  PRE-OPERATIVE DIAGNOSIS:  POLYP  POST-OPERATIVE DIAGNOSIS:  POLYP  PROCEDURE:  Procedure(s): DILATATION & CURETTAGE/HYSTEROSCOPY WITH  MYOSURE (N/A)  SURGEON:  Surgeon(s) and Role:    * Everlene Farrier, MD - Primary  PHYSICIAN ASSISTANT:   ASSISTANTS: none   ANESTHESIA:   general  EBL:  30 mL   BLOOD ADMINISTERED:none  DRAINS: none   LOCAL MEDICATIONS USED:  LIDOCAINE  and Amount: 17 ml  SPECIMEN:  Source of Specimen:  endocervical polyp, endometrial curetting, endometrial resection  DISPOSITION OF SPECIMEN:  PATHOLOGY  COUNTS:  YES  TOURNIQUET:  * No tourniquets in log *  DICTATION: .Other Dictation: Dictation Number [id number]  PLAN OF CARE: Discharge to home after PACU  PATIENT DISPOSITION:  PACU - hemodynamically stable.   Delay start of Pharmacological VTE agent (>24hrs) due to surgical blood loss or risk of bleeding: not applicable

## 2020-11-10 NOTE — Progress Notes (Signed)
No change to H&P per patient history Reviewed procedure-H/S, D&C, possible Myosure resection Post operative instructions discussed All questions answered She states she understands and agrees

## 2020-11-13 ENCOUNTER — Encounter (HOSPITAL_BASED_OUTPATIENT_CLINIC_OR_DEPARTMENT_OTHER): Payer: Self-pay | Admitting: Obstetrics and Gynecology

## 2020-11-13 LAB — SURGICAL PATHOLOGY

## 2020-12-16 ENCOUNTER — Encounter: Payer: Self-pay | Admitting: Family Medicine

## 2021-01-03 ENCOUNTER — Ambulatory Visit: Payer: BC Managed Care – PPO | Admitting: Registered Nurse

## 2021-01-09 ENCOUNTER — Ambulatory Visit: Payer: BC Managed Care – PPO | Admitting: Registered Nurse

## 2021-01-09 ENCOUNTER — Encounter: Payer: Self-pay | Admitting: Registered Nurse

## 2021-01-09 VITALS — BP 107/80 | HR 80 | Temp 98.0°F | Resp 16 | Ht 62.0 in | Wt 176.2 lb

## 2021-01-09 DIAGNOSIS — L918 Other hypertrophic disorders of the skin: Secondary | ICD-10-CM

## 2021-01-09 NOTE — Progress Notes (Signed)
Established Patient Office Visit  Subjective:  Patient ID: Stephanie Nunez, female    DOB: [birth year]  Age: 60 y.o. MRN: [id number]  CC:  Chief Complaint  Patient presents with   Skin Tag    Patient states she has a skin tag on on the back of the head that is causing her some discomfort.    HPI CHAVY AVERA presents for skin tag  Right rear of head. Has been present for years, but thinks it has been growing lately.  Notes discomfort when pulling hair up and when wearing hats.  No drainage or discharge. No pain.   Notes changes in mole on abdomen. L side of abdomen.  Dark, flat mole. Expanding diameter over time. No changes in coloration, elevation, or irregular border.   Past Medical History:  Diagnosis Date   Allergic rhinitis    Cervical polyp    History of gestational diabetes mellitus    Hyperlipidemia    no meds   Hypertension    Type 2 diabetes mellitus ([place])    followed by pcp---  (11-08-2020  pt stated check blood sugar once weekly, fasting sugar -- 145)   Uterine fibroid    Wears glasses     Past Surgical History:  Procedure Laterality Date   COLONOSCOPY  12/06/2015   by stark   [place] N/A 11/10/2020   Procedure: [place];  Surgeon: Everlene Farrier, MD;  Location: [facility];  Service: Gynecology;  Laterality: N/A;   LIPOMA EXCISION  1990   Left chest wall   LUMBAR SPINE SURGERY  1992   L4-5    Family History  Problem Relation Age of Onset   Cancer Father        pancreatic   Pancreatic cancer Father    Diabetes Sister    Hypertension Sister    Hypertension Sister    Hypertension Brother    Stroke Maternal Grandmother    Stroke Paternal Grandmother    Colon cancer Neg Hx    Colon polyps Neg Hx    Esophageal cancer Neg Hx    Rectal cancer Neg Hx    Stomach cancer Neg Hx     Social History   Socioeconomic History   Marital status: Married     Spouse name: Gerald Stabs   Number of children: 1   Years of education: 16   Highest education level: Not on file  Occupational History   Occupation: Control and instrumentation engineer  Tobacco Use   Smoking status: Never   Smokeless tobacco: Never  Substance and Sexual Activity   Alcohol use: Not Currently    Comment: seldom   Drug use: Never   Sexual activity: Yes    Partners: Male    Birth control/protection: Condom  Other Topics Concern   Not on file  Social History Narrative   Lives with her husband (a Software engineer) and their daughter.  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     blood glucose meter  kit and supplies Dispense based on insurance preference. once daily as directed. E11.9 1 each 0   cholecalciferol (VITAMIN D) 1000 units tablet Take 1,000 Units by mouth daily.     dapagliflozin propanediol (FARXIGA) 10 MG TABS tablet TAKE 1 TABLET(10 MG) BY MOUTH DAILY BEFORE BREAKFAST (Patient taking differently: Take 10 mg by mouth daily. TAKE 1 TABLET(10 MG) BY MOUTH DAILY BEFORE BREAKFAST) 90 tablet 1   fluticasone (FLONASE) 50 MCG/ACT nasal spray SHAKE LIQUID AND USE 1 TO 2 SPRAYS IN EACH NOSTRIL DAILY (Patient taking differently: Place into both nostrils daily.) 16 g 2   gabapentin (NEURONTIN) 100 MG capsule TAKE 1 CAPSULE(100 MG) BY MOUTH EVERY EVENING (Patient taking differently: Take 100 mg by mouth at bedtime. TAKE 1 CAPSULE(100 MG) BY MOUTH EVERY EVENING) 90 capsule 1   hydrochlorothiazide (HYDRODIURIL) 25 MG tablet TAKE 1 TABLET BY MOUTH EVERY DAY (Patient taking  differently: Take 25 mg by mouth daily.) 90 tablet 1   lisinopril (ZESTRIL) 10 MG tablet TAKE 1 TABLET(10 MG) BY MOUTH DAILY (Patient taking differently: Take 10 mg by mouth daily. TAKE 1 TABLET(10 MG) BY MOUTH DAILY) 90 tablet 1   lovastatin (MEVACOR) 20 MG tablet TAKE 1 TABLET BY MOUTH EVERY DAY. INCREASE UP TO EVERY DAY AS TOLERATED (Patient taking differently: Take 20 mg by mouth at bedtime. TAKE 1 TABLET BY MOUTH EVERY DAY. INCREASE UP TO EVERY DAY AS TOLERATED) 90 tablet 1   metFORMIN (GLUCOPHAGE) 1000 MG tablet TAKE 1 TABLET(1000 MG) BY MOUTH TWICE DAILY WITH A MEAL (Patient taking differently: Take 1,000 mg by mouth 2 (two) times daily with a meal. TAKE 1 TABLET(1000 MG) BY MOUTH TWICE DAILY WITH A MEAL) 180 tablet 1   Multiple Vitamin (MULTIVITAMIN) tablet Take 1 tablet by mouth daily.     niacin (NIASPAN) 750 MG CR tablet TAKE 2 TABLETS(1500 MG) BY MOUTH AT BEDTIME (Patient taking differently: Take 750 mg by mouth at bedtime. TAKE 2 TABLETS(1500 MG) BY MOUTH AT BEDTIME) 180 tablet 1   ONETOUCH ULTRA test strip USE TO TEST AS DIRECTED ONCE DAILY 100 strip 0   No facility-administered medications prior to visit.    Allergies  Allergen Reactions   Simvastatin     myalgia   Statins     Severe musculoskeletal pains.    ROS Review of Systems  Constitutional: Negative.   HENT: Negative.    Eyes: Negative.   Respiratory: Negative.    Cardiovascular: Negative.   Gastrointestinal: Negative.   Genitourinary: Negative.   Musculoskeletal: Negative.   Skin: Negative.   Neurological: Negative.   Psychiatric/Behavioral: Negative.    All other systems reviewed and are negative.    Objective:    Physical Exam Vitals and nursing note reviewed.  Constitutional:      General: She is not in acute distress.    Appearance: Normal appearance. She is normal weight. She is not ill-appearing, toxic-appearing or diaphoretic.  Cardiovascular:     Rate and Rhythm: Normal rate and regular  rhythm.     Heart sounds: Normal heart sounds. No murmur heard.   No friction rub. No gallop.  Pulmonary:     Effort: Pulmonary effort is normal. No respiratory distress.     Breath sounds: Normal breath sounds. No stridor. No wheezing, rhonchi or rales.  Chest:     Chest wall: No tenderness.  Skin:    General: Skin is warm and dry.     Coloration: Skin is not jaundiced or pale.  Findings: Lesion (skin tag on side of head. benign appearing.  In addition, benign appearing nevi on abdomen.) present. No bruising, erythema or rash.  Neurological:     General: No focal deficit present.     Mental Status: She is alert and oriented to person, place, and time. Mental status is at baseline.  Psychiatric:        Mood and Affect: Mood normal.        Behavior: Behavior normal.        Thought Content: Thought content normal.        Judgment: Judgment normal.   Procedure note Reviewed risks, benefits, alternatives, and side effects with patient Questions answered and concerns addressed Pt signed informed consent Pt anesthesized with 0.5cc 1% lidocaine with epinephrine at peduncle of skin tag. Skin tag removed with #11 scalpel.  Gauze applied to area. After care discussed Pt tolerated procedure well Return precautions reviewed.  BP 107/80    Pulse 80    Temp 98 F (36.7 C) (Temporal)    Resp 16    Ht _0  (1.575 m)    Wt 176 lb 3.2 oz (79.9 kg)    LMP 10/28/2015 (Approximate)    SpO2 99%    BMI 32.23 kg/m  Wt Readings from Last 3 Encounters:  01/09/21 176 lb 3.2 oz (79.9 kg)  11/10/20 177 lb 12.8 oz (80.6 kg)  11/08/20 187 lb (84.8 kg)     Health Maintenance Due  Topic Date Due   Pneumococcal Vaccine 22-61 Years old (2 - PCV) 09/08/2016   Zoster Vaccines- Shingrix (2 of 2) 12/08/2018   FOOT EXAM  04/06/2020   PAP SMEAR-Modifier  02/11/2021    There are no preventive care reminders to display for this patient.  Lab Results  Component Value Date   TSH 0.672 09/21/2016   Lab  Results  Component Value Date   WBC 5.1 11/08/2020   HGB 14.5 11/08/2020   HCT 43.6 11/08/2020   MCV 95.2 11/08/2020   PLT 373 11/08/2020   Lab Results  Component Value Date   NA 138 11/08/2020   K 3.2 (L) 11/08/2020   CO2 28 11/08/2020   GLUCOSE 126 (H) 11/08/2020   BUN 12 11/08/2020   CREATININE 0.55 11/08/2020   BILITOT 0.9 08/10/2020   ALKPHOS 51 08/10/2020   AST 17 08/10/2020   ALT 12 08/10/2020   PROT 6.7 08/10/2020   ALBUMIN 4.2 08/10/2020   CALCIUM 9.3 11/08/2020   ANIONGAP 10 11/08/2020   GFR 83.07 10/18/2020   Lab Results  Component Value Date   CHOL 194 08/10/2020   Lab Results  Component Value Date   HDL 58.00 08/10/2020   Lab Results  Component Value Date   LDLCALC 123 (H) 08/10/2020   Lab Results  Component Value Date   TRIG 64.0 08/10/2020   Lab Results  Component Value Date   CHOLHDL 3 08/10/2020   Lab Results  Component Value Date   HGBA1C 6.7 (H) 08/10/2020      Assessment & Plan:   Problem List Items Addressed This Visit   None Visit Diagnoses     Skin tag    -  Primary       No orders of the defined types were placed in this encounter.   Follow-up: Return if symptoms worsen or fail to improve.   PLAN Skin tag removal on lesion on scalp. No complications. I do not expect recurrence or complication.  Refer to derm for assessment of nevi  and head to toe preventative exam. Return prn Patient encouraged to call clinic with any questions, comments, or concerns.  Maximiano Coss, NP

## 2021-01-09 NOTE — Patient Instructions (Addendum)
Ms. Janaye Corp to see you.   Shouldn't be any worries on the skin tag, but let me know if any concerns arise.   Dermatology referral placed. They'll call you to set up an appt.  Call if you need anything  Thanks,  Rich     If you have lab work done today you will be contacted with your lab results within the next 2 weeks.  If you have not heard from [place] then please contact us. The fastest way to get your results is to register for My Chart.   IF you received an x-ray today, you will receive an invoice from [facility]. Please contact [facility] at [phone] with questions or concerns regarding your invoice.   IF you received labwork today, you will receive an invoice from [facility]. Please contact [facility] at [phone] with questions or concerns regarding your invoice.   Our billing staff will not be able to assist you with questions regarding bills from these companies.  You will be contacted with the lab results as soon as they are available. The fastest way to get your results is to activate your My Chart account. Instructions are located on the last page of this paperwork. If you have not heard from [place] regarding the results in 2 weeks, please contact this office.

## 2021-01-24 ENCOUNTER — Other Ambulatory Visit: Payer: Self-pay | Admitting: Family Medicine

## 2021-01-24 DIAGNOSIS — R202 Paresthesia of skin: Secondary | ICD-10-CM

## 2021-01-24 DIAGNOSIS — E1141 Type 2 diabetes mellitus with diabetic mononeuropathy: Secondary | ICD-10-CM

## 2021-01-24 DIAGNOSIS — E1129 Type 2 diabetes mellitus with other diabetic kidney complication: Secondary | ICD-10-CM

## 2021-02-23 ENCOUNTER — Other Ambulatory Visit: Payer: Self-pay | Admitting: Family Medicine

## 2021-02-23 DIAGNOSIS — E1129 Type 2 diabetes mellitus with other diabetic kidney complication: Secondary | ICD-10-CM

## 2021-03-19 ENCOUNTER — Ambulatory Visit: Payer: BC Managed Care – PPO | Admitting: Family Medicine

## 2021-03-19 ENCOUNTER — Encounter: Payer: Self-pay | Admitting: Family Medicine

## 2021-03-19 VITALS — BP 124/76 | HR 76 | Temp 97.8°F | Resp 16 | Ht 62.0 in | Wt 177.4 lb

## 2021-03-19 DIAGNOSIS — H6692 Otitis media, unspecified, left ear: Secondary | ICD-10-CM

## 2021-03-19 DIAGNOSIS — H1032 Unspecified acute conjunctivitis, left eye: Secondary | ICD-10-CM

## 2021-03-19 MED ORDER — POLYMYXIN B-TRIMETHOPRIM 10000-0.1 UNIT/ML-% OP SOLN
1.0000 [drp] | Freq: Four times a day (QID) | OPHTHALMIC | 0 refills | Status: AC
Start: 2021-03-19 — End: 2021-03-24

## 2021-03-19 MED ORDER — AMOXICILLIN-POT CLAVULANATE 875-125 MG PO TABS: 1.0000 | ORAL_TABLET | Freq: Two times a day (BID) | ORAL | 0 refills | Status: DC

## 2021-03-19 NOTE — Progress Notes (Signed)
Subjective:  Patient ID: Stephanie Nunez, female    DOB: [birth year]  Age: 61 y.o. MRN: [id number]  CC:  Chief Complaint  Patient presents with   Ear Fullness    Pt has Lt ear fullness starting Friday, pain below the ear, notes ringing started yesterday , loss of hearing, denies fever besides 1 week ago 101.2 otherwise no fever     HPI Stephanie Nunez presents for   Left ear pain/fullness  - 3 days ago - felt throbbing below left ear, pain in left ear and decreased hearing in left ear since Friday. Tried otc allergy meds. Min congestion/cold symptoms early lat week. Fever 1 week ago, none since.  Ringing in left ear yesterday. Trouble popping ear.  No current nasal congestion.  No ear d/c or bleeding.  Tried otc ear drops for cerumen - no relief.  No recent swimming.  Some d/c from left eye few times past week, noted this am, no daytime d/c.   History Patient Active Problem List   Diagnosis Date Noted   Epiphora 01/05/2016   HTN (hypertension) 02/01/2011   Hyperlipemia 02/01/2011   Obesity (BMI 30-39.9) 02/01/2011   DM type 2 (diabetes mellitus, type 2) ([place]) 02/01/2011   Past Medical History:  Diagnosis Date   Allergic rhinitis    Cervical polyp    History of gestational diabetes mellitus    Hyperlipidemia    no meds   Hypertension    Type 2 diabetes mellitus ([place])    followed by pcp---  (11-08-2020  pt stated check blood sugar once weekly, fasting sugar -- 145)   Uterine fibroid    Wears glasses    Past Surgical History:  Procedure Laterality Date   COLONOSCOPY  12/06/2015   by stark   [place] N/A 11/10/2020   Procedure: [place];  Surgeon: Everlene Farrier, MD;  Location: [facility];  Service: Gynecology;  Laterality: N/A;   LIPOMA EXCISION  1990   Left chest wall   LUMBAR SPINE SURGERY  1992   L4-5   Allergies  Allergen Reactions   Simvastatin     myalgia    Statins     Severe musculoskeletal pains.   Prior to Admission medications   Medication Sig Start Date End Date Taking? Authorizing Provider  aspirin 81 MG tablet Take 81 mg by mouth at bedtime.   Yes [provider]  blood glucose meter kit and supplies Dispense based on insurance preference. once daily as directed. E11.9 02/03/19  Yes [name], MD  cholecalciferol (VITAMIN D) 1000 units tablet Take 1,000 Units by mouth daily.   Yes [provider]  FARXIGA 10 MG TABS tablet TAKE 1 TABLET(10 MG) BY MOUTH DAILY BEFORE BREAKFAST 02/23/21  Yes [name], MD  fluticasone (FLONASE) 50 MCG/ACT nasal spray SHAKE LIQUID AND USE 1 TO 2 SPRAYS IN EACH NOSTRIL DAILY Patient taking differently: Place into both nostrils daily. 05/09/20  Yes [name], MD  gabapentin (NEURONTIN) 100 MG capsule TAKE 1 CAPSULE(100 MG) BY MOUTH EVERY EVENING 01/24/21  Yes [name], MD  hydrochlorothiazide (HYDRODIURIL) 25 MG tablet TAKE 1 TABLET BY MOUTH EVERY DAY Patient taking differently: Take 25 mg by mouth daily. 11/06/20  Yes [name], MD  lisinopril (ZESTRIL) 10 MG tablet TAKE 1 TABLET(10 MG) BY MOUTH DAILY Patient taking differently: Take 10 mg by mouth daily. TAKE 1 TABLET(10 MG) BY MOUTH DAILY 08/11/20  Yes [name], MD  lovastatin (MEVACOR) 20 MG tablet TAKE 1 TABLET BY MOUTH EVERY DAY. INCREASE UP TO EVERY DAY AS TOLERATED Patient taking differently: Take 20 mg by mouth at bedtime. TAKE 1 TABLET BY MOUTH EVERY DAY. INCREASE UP TO EVERY DAY AS TOLERATED 10/16/20  Yes [name], MD  metFORMIN (GLUCOPHAGE) 1000 MG tablet TAKE 1 TABLET(1000 MG) BY MOUTH TWICE DAILY WITH A MEAL 01/24/21  Yes [name], MD  Multiple Vitamin (MULTIVITAMIN) tablet Take 1 tablet by mouth daily.   Yes [provider]  niacin (NIASPAN) 750 MG CR tablet TAKE 2 TABLETS(1500 MG) BY MOUTH AT BEDTIME Patient taking differently: Take 750 mg by mouth at bedtime.  TAKE 2 TABLETS(1500 MG) BY MOUTH AT BEDTIME 08/10/20  Yes [name], MD  [facility] ULTRA test strip USE TO TEST AS DIRECTED ONCE DAILY 08/03/19  Yes [name], MD   Social History   Socioeconomic History   Marital status: Married    Spouse name: Gerald Stabs   Number of children: 1   Years of education: 16   Highest education level: Not on file  Occupational History   Occupation: Control and instrumentation engineer  Tobacco Use   Smoking status: Never   Smokeless tobacco: Never  Substance and Sexual Activity   Alcohol use: Not Currently    Comment: seldom   Drug use: Never   Sexual activity: Yes    Partners: Male    Birth control/protection: Condom  Other Topics Concern   Not on file  Social History Narrative   Lives with her husband (a Software engineer) and their daughter.  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   Objective:   Vitals:   03/19/21 1529  BP: 124/76  Pulse: 76  Resp: 16  Temp: 97.8 F (36.6 C)  TempSrc: Temporal  SpO2: 97%  Weight: 177 lb 6.4 oz (80.5 kg)  Height: '5\' 2"'  (1.575 m)     Physical Exam Vitals reviewed.  Constitutional:      General: She is not in acute distress.    Appearance: She is well-developed.  HENT:     Head: Normocephalic and atraumatic.     Right Ear: Hearing, tympanic membrane, ear canal and external ear normal.     Left Ear: Hearing, ear canal and external ear normal.     Ears:     Comments: Retracted, dull, injected left TM.  No rupture seen.  Canal clear.  External ear nontender. Mastoid nontender.    Nose: Nose normal.     Mouth/Throat:     Pharynx: No posterior oropharyngeal erythema.  Eyes:      General: Lids are normal.     Extraocular Movements: Extraocular movements intact.     Conjunctiva/sclera: Conjunctivae normal.     Pupils: Pupils are equal, round, and reactive to light.  Cardiovascular:     Rate and Rhythm: Normal rate and regular rhythm.     Heart sounds: Normal heart sounds. No murmur heard. Pulmonary:     Effort: Pulmonary effort is normal. No respiratory distress.     Breath sounds: Normal breath sounds. No wheezing or  rhonchi.  Skin:    General: Skin is warm and dry.     Findings: No rash.  Neurological:     Mental Status: She is alert and oriented to person, place, and time.  Psychiatric:        Mood and Affect: Mood normal.        Behavior: Behavior normal.       Assessment & Plan:  Stephanie Nunez is a 61 y.o. female . Left otitis media, unspecified otitis media type - Plan: amoxicillin-clavulanate (AUGMENTIN) 875-125 MG tablet  Acute conjunctivitis of left eye, unspecified acute conjunctivitis type - Plan: trimethoprim-polymyxin b (POLYTRIM) ophthalmic solution Left otitis media likely cause of decreased hearing, pain.  No sign of rupture.  Mastoid nontender.  Start Augmentin, RTC precautions if not improving or persistent hearing difficulty, or new/worsening symptoms.  Eye exam overall reassuring at this time.  Polytrim drops sent to pharmacy on hold if redness returns improved persistent discharge returns.  RTC precautions.  Meds ordered this encounter  Medications   amoxicillin-clavulanate (AUGMENTIN) 875-125 MG tablet    Sig: Take 1 tablet by mouth 2 (two) times daily.    Dispense:  20 tablet    Refill:  0   trimethoprim-polymyxin b (POLYTRIM) ophthalmic solution    Sig: Place 1 drop into the left eye every 6 (six) hours for 5 days.    Dispense:  10 mL    Refill:  0    Ok to place on hold - fill if continued redness and eye discharge   Patient Instructions  Start antibiotic pills for left ear infection.  Expect symptoms to be improving as  the week goes on including hearing but recheck if not.  Follow-up if any worsening of symptoms.  Eyes look okay for now but if you notice more redness or persistent discharge I did send some antibiotic eyedrops to your pharmacy.  Let me know if there are questions and get well soon.   Otitis Media, Adult Otitis media occurs when there is inflammation and fluid in the middle ear with signs and symptoms of an acute infection. The middle ear is a part of the ear that contains bones for hearing as well as air that helps send sounds to the brain. When infected fluid builds up in this space, it causes pressure and can lead to an ear infection. The eustachian tube connects the middle ear to the back of the nose (nasopharynx) and normally allows air into the middle ear. If the eustachian tube becomes blocked, fluid can build up and become infected. What are the causes? This condition is caused by a blockage in the eustachian tube. This can be caused by mucus or by swelling of the tube. Problems that can cause a blockage include: A cold or other upper respiratory infection. Allergies. An irritant, such as tobacco smoke. Enlarged adenoids. The adenoids are areas of soft tissue located high in the back of the throat, behind the nose and the roof of the mouth. They are part of the body's defense system (immune system). A mass in the nasopharynx. Damage to the ear caused by pressure changes (barotrauma). What increases the risk? You are more likely to develop this condition if you: Smoke or are exposed to tobacco smoke. Have an opening in the roof of your mouth (cleft palate). Have gastroesophageal reflux. Have an immune system disorder. What are the signs or symptoms? Symptoms of this condition include: Ear pain. Fever. Decreased hearing. Tiredness (lethargy). Fluid leaking from the  ear, if the eardrum is ruptured or has burst. Ringing in the ear. How is this diagnosed? This condition is diagnosed  with a physical exam. During the exam, your health care provider will use an instrument called an otoscope to look in your ear and check for redness, swelling, and fluid. He or she will also ask about your symptoms. Your health care provider may also order tests, such as: A pneumatic otoscopy. This is a test to check the movement of the eardrum. It is done by squeezing a small amount of air into the ear. A tympanogram. This is a test that shows how well the eardrum moves in response to air pressure in the ear canal. It provides a graph for your health care provider to review. How is this treated? This condition can go away on its own within 3-5 days. But if the condition is caused by a bacterial infection and does not go away on its own, or if it keeps coming back, your health care provider may: Prescribe antibiotic medicine to treat the infection. Prescribe or recommend medicines to control pain. Follow these instructions at home: Take over-the-counter and prescription medicines only as told by your health care provider. If you were prescribed an antibiotic medicine, take it as told by your health care provider. Do not stop taking the antibiotic even if you start to feel better. Keep all follow-up visits. This is important. Contact a health care provider if: You have bleeding from your nose. There is a lump on your neck. You are not feeling better in 5 days. You feel worse instead of better. Get help right away if: You have severe pain that is not controlled with medicine. You have swelling, redness, or pain around your ear. You have stiffness in your neck. A part of your face is not moving (paralyzed). The bone behind your ear (mastoid bone) is tender when you touch it. You develop a severe headache. Summary Otitis media is redness, soreness, and swelling of the middle ear, usually resulting in pain and decreased hearing. This condition can go away on its own within 3-5 days. If the  problem does not go away in 3-5 days, your health care provider may give you medicines to treat the infection. If you were prescribed an antibiotic medicine, take it as told by your health care provider. Follow all instructions that were given to you by your health care provider. This information is not intended to replace advice given to you by your health care provider. Make sure you discuss any questions you have with your health care provider. Document Revised: 04/10/2020 Document Reviewed: 04/10/2020 Elsevier Patient Education  2022 [place],   Merri Ray, MD [place] 03/19/21 4:02 PM

## 2021-03-19 NOTE — Patient Instructions (Signed)
Start antibiotic pills for left ear infection.  Expect symptoms to be improving as the week goes on including hearing but recheck if not.  Follow-up if any worsening of symptoms.  Eyes look okay for now but if you notice more redness or persistent discharge I did send some antibiotic eyedrops to your pharmacy.  Let me know if there are questions and get well soon.  ? ?Otitis Media, Adult ?Otitis media occurs when there is inflammation and fluid in the middle ear with signs and symptoms of an acute infection. The middle ear is a part of the ear that contains bones for hearing as well as air that helps send sounds to the brain. When infected fluid builds up in this space, it causes pressure and can lead to an ear infection. The eustachian tube connects the middle ear to the back of the nose (nasopharynx) and normally allows air into the middle ear. If the eustachian tube becomes blocked, fluid can build up and become infected. ?What are the causes? ?This condition is caused by a blockage in the eustachian tube. This can be caused by mucus or by swelling of the tube. Problems that can cause a blockage include: ?A cold or other upper respiratory infection. ?Allergies. ?An irritant, such as tobacco smoke. ?Enlarged adenoids. The adenoids are areas of soft tissue located high in the back of the throat, behind the nose and the roof of the mouth. They are part of the body's defense system (immune system). ?A mass in the nasopharynx. ?Damage to the ear caused by pressure changes (barotrauma). ?What increases the risk? ?You are more likely to develop this condition if you: ?Smoke or are exposed to tobacco smoke. ?Have an opening in the roof of your mouth (cleft palate). ?Have gastroesophageal reflux. ?Have an immune system disorder. ?What are the signs or symptoms? ?Symptoms of this condition include: ?Ear pain. ?Fever. ?Decreased hearing. ?Tiredness (lethargy). ?Fluid leaking from the ear, if the eardrum is ruptured or has  burst. ?Ringing in the ear. ?How is this diagnosed? ?This condition is diagnosed with a physical exam. During the exam, your health care provider will use an instrument called an otoscope to look in your ear and check for redness, swelling, and fluid. He or she will also ask about your symptoms. ?Your health care provider may also order tests, such as: ?A pneumatic otoscopy. This is a test to check the movement of the eardrum. It is done by squeezing a small amount of air into the ear. ?A tympanogram. This is a test that shows how well the eardrum moves in response to air pressure in the ear canal. It provides a graph for your health care provider to review. ?How is this treated? ?This condition can go away on its own within 3-5 days. But if the condition is caused by a bacterial infection and does not go away on its own, or if it keeps coming back, your health care provider may: ?Prescribe antibiotic medicine to treat the infection. ?Prescribe or recommend medicines to control pain. ?Follow these instructions at home: ?Take over-the-counter and prescription medicines only as told by your health care provider. ?If you were prescribed an antibiotic medicine, take it as told by your health care provider. Do not stop taking the antibiotic even if you start to feel better. ?Keep all follow-up visits. This is important. ?Contact a health care provider if: ?You have bleeding from your nose. ?There is a lump on your neck. ?You are not feeling better in 5  days. ?You feel worse instead of better. ?Get help right away if: ?You have severe pain that is not controlled with medicine. ?You have swelling, redness, or pain around your ear. ?You have stiffness in your neck. ?A part of your face is not moving (paralyzed). ?The bone behind your ear (mastoid bone) is tender when you touch it. ?You develop a severe headache. ?Summary ?Otitis media is redness, soreness, and swelling of the middle ear, usually resulting in pain and  decreased hearing. ?This condition can go away on its own within 3-5 days. ?If the problem does not go away in 3-5 days, your health care provider may give you medicines to treat the infection. ?If you were prescribed an antibiotic medicine, take it as told by your health care provider. ?Follow all instructions that were given to you by your health care provider. ?This information is not intended to replace advice given to you by your health care provider. Make sure you discuss any questions you have with your health care provider. ?Document Revised: 04/10/2020 Document Reviewed: 04/10/2020 ?Elsevier Patient Education ? [place]. ? ?

## 2021-03-24 ENCOUNTER — Encounter: Payer: Self-pay | Admitting: Family Medicine

## 2021-04-02 NOTE — Telephone Encounter (Signed)
See notes to patient. ?

## 2021-04-02 NOTE — Telephone Encounter (Signed)
Pt notes continued congestion and drainage fullness in the ears has been taking daily antihistamine and neti pot.  ? ?Please advise  ?

## 2021-04-07 ENCOUNTER — Other Ambulatory Visit: Payer: Self-pay | Admitting: Family Medicine

## 2021-04-07 DIAGNOSIS — J302 Other seasonal allergic rhinitis: Secondary | ICD-10-CM

## 2021-04-25 ENCOUNTER — Other Ambulatory Visit: Payer: Self-pay | Admitting: Family Medicine

## 2021-05-07 ENCOUNTER — Other Ambulatory Visit: Payer: Self-pay | Admitting: Family Medicine

## 2021-05-07 DIAGNOSIS — J302 Other seasonal allergic rhinitis: Secondary | ICD-10-CM

## 2021-05-09 ENCOUNTER — Other Ambulatory Visit: Payer: Self-pay | Admitting: Family Medicine

## 2021-05-09 DIAGNOSIS — E785 Hyperlipidemia, unspecified: Secondary | ICD-10-CM

## 2021-05-09 DIAGNOSIS — I10 Essential (primary) hypertension: Secondary | ICD-10-CM

## 2021-06-20 ENCOUNTER — Other Ambulatory Visit: Payer: Self-pay | Admitting: Family Medicine

## 2021-06-20 DIAGNOSIS — I10 Essential (primary) hypertension: Secondary | ICD-10-CM

## 2021-07-17 ENCOUNTER — Other Ambulatory Visit: Payer: Self-pay | Admitting: Family Medicine

## 2021-07-17 DIAGNOSIS — E1129 Type 2 diabetes mellitus with other diabetic kidney complication: Secondary | ICD-10-CM

## 2021-07-27 ENCOUNTER — Encounter: Payer: Self-pay | Admitting: Family Medicine

## 2021-07-27 LAB — HM MAMMOGRAPHY

## 2021-08-07 ENCOUNTER — Encounter: Payer: Self-pay | Admitting: Family Medicine

## 2021-08-23 ENCOUNTER — Other Ambulatory Visit: Payer: Self-pay | Admitting: Family Medicine

## 2021-08-23 DIAGNOSIS — R809 Proteinuria, unspecified: Secondary | ICD-10-CM

## 2021-08-23 NOTE — Telephone Encounter (Signed)
Stephanie Nunez Last Visit - 03/19/21 90 Tablets - 02/23/21

## 2021-10-23 LAB — HM DIABETES EYE EXAM

## 2021-11-30 ENCOUNTER — Other Ambulatory Visit: Payer: Self-pay | Admitting: Family Medicine

## 2021-11-30 DIAGNOSIS — E1141 Type 2 diabetes mellitus with diabetic mononeuropathy: Secondary | ICD-10-CM

## 2021-11-30 DIAGNOSIS — R202 Paresthesia of skin: Secondary | ICD-10-CM

## 2021-11-30 NOTE — Telephone Encounter (Signed)
Gabapentin 100 mg LOV: 03/19/21 Last Refill:01/24/21 Upcoming appt: none

## 2021-11-30 NOTE — Telephone Encounter (Signed)
Overdue for diabetes follow-up.  I will refill gabapentin temporarily but needs office visit prior to further refills.

## 2021-12-22 ENCOUNTER — Other Ambulatory Visit: Payer: Self-pay | Admitting: Family Medicine

## 2021-12-22 DIAGNOSIS — E785 Hyperlipidemia, unspecified: Secondary | ICD-10-CM

## 2021-12-23 ENCOUNTER — Other Ambulatory Visit: Payer: Self-pay | Admitting: Family Medicine

## 2021-12-23 DIAGNOSIS — E785 Hyperlipidemia, unspecified: Secondary | ICD-10-CM

## 2022-01-01 ENCOUNTER — Other Ambulatory Visit: Payer: Self-pay | Admitting: Family Medicine

## 2022-01-01 DIAGNOSIS — E785 Hyperlipidemia, unspecified: Secondary | ICD-10-CM

## 2022-01-15 ENCOUNTER — Other Ambulatory Visit: Payer: Self-pay | Admitting: Family Medicine

## 2022-01-15 ENCOUNTER — Other Ambulatory Visit: Payer: Self-pay | Admitting: Lab

## 2022-01-15 DIAGNOSIS — E1129 Type 2 diabetes mellitus with other diabetic kidney complication: Secondary | ICD-10-CM

## 2022-02-19 ENCOUNTER — Other Ambulatory Visit: Payer: Self-pay | Admitting: Family Medicine

## 2022-02-19 DIAGNOSIS — R809 Proteinuria, unspecified: Secondary | ICD-10-CM

## 2022-02-26 ENCOUNTER — Other Ambulatory Visit: Payer: Self-pay | Admitting: Family Medicine

## 2022-02-26 DIAGNOSIS — E1141 Type 2 diabetes mellitus with diabetic mononeuropathy: Secondary | ICD-10-CM

## 2022-02-26 DIAGNOSIS — R202 Paresthesia of skin: Secondary | ICD-10-CM

## 2022-02-26 NOTE — Telephone Encounter (Signed)
Gabapentin 100 mg LOV: 03/19/21 Last Refill:11/30/21 Upcoming appt: none

## 2022-02-26 NOTE — Telephone Encounter (Signed)
Gabapentin temporarily refilled but I do not see upcoming appointment to review chronic medications.  Please schedule appointment in the next 4 to 6 weeks.

## 2022-02-27 NOTE — Telephone Encounter (Signed)
Called Pt left message for her to call the office and home phone is disconnected.

## 2022-03-15 ENCOUNTER — Other Ambulatory Visit: Payer: Self-pay | Admitting: Family Medicine

## 2022-03-15 DIAGNOSIS — E1129 Type 2 diabetes mellitus with other diabetic kidney complication: Secondary | ICD-10-CM

## 2022-03-18 ENCOUNTER — Other Ambulatory Visit: Payer: Self-pay | Admitting: Family Medicine

## 2022-03-18 DIAGNOSIS — E1129 Type 2 diabetes mellitus with other diabetic kidney complication: Secondary | ICD-10-CM

## 2022-03-21 ENCOUNTER — Other Ambulatory Visit: Payer: Self-pay | Admitting: Family Medicine

## 2022-03-21 DIAGNOSIS — I10 Essential (primary) hypertension: Secondary | ICD-10-CM

## 2022-03-25 ENCOUNTER — Other Ambulatory Visit: Payer: Self-pay | Admitting: Family Medicine

## 2022-03-25 DIAGNOSIS — I10 Essential (primary) hypertension: Secondary | ICD-10-CM

## 2022-04-08 ENCOUNTER — Encounter: Payer: Self-pay | Admitting: Family Medicine

## 2022-04-08 ENCOUNTER — Ambulatory Visit (INDEPENDENT_AMBULATORY_CARE_PROVIDER_SITE_OTHER): Payer: BC Managed Care – PPO | Admitting: Family Medicine

## 2022-04-08 VITALS — BP 121/64 | HR 90 | Temp 98.6°F | Ht 62.0 in | Wt 181.2 lb

## 2022-04-08 DIAGNOSIS — Z13220 Encounter for screening for lipoid disorders: Secondary | ICD-10-CM

## 2022-04-08 DIAGNOSIS — Z1329 Encounter for screening for other suspected endocrine disorder: Secondary | ICD-10-CM

## 2022-04-08 DIAGNOSIS — R202 Paresthesia of skin: Secondary | ICD-10-CM | POA: Diagnosis not present

## 2022-04-08 DIAGNOSIS — E1129 Type 2 diabetes mellitus with other diabetic kidney complication: Secondary | ICD-10-CM

## 2022-04-08 DIAGNOSIS — R809 Proteinuria, unspecified: Secondary | ICD-10-CM

## 2022-04-08 DIAGNOSIS — E1141 Type 2 diabetes mellitus with diabetic mononeuropathy: Secondary | ICD-10-CM | POA: Diagnosis not present

## 2022-04-08 DIAGNOSIS — I10 Essential (primary) hypertension: Secondary | ICD-10-CM

## 2022-04-08 DIAGNOSIS — Z136 Encounter for screening for cardiovascular disorders: Secondary | ICD-10-CM

## 2022-04-08 DIAGNOSIS — Z0000 Encounter for general adult medical examination without abnormal findings: Secondary | ICD-10-CM

## 2022-04-08 DIAGNOSIS — E785 Hyperlipidemia, unspecified: Secondary | ICD-10-CM

## 2022-04-08 DIAGNOSIS — Z131 Encounter for screening for diabetes mellitus: Secondary | ICD-10-CM

## 2022-04-08 MED ORDER — LOVASTATIN 20 MG PO TABS: ORAL_TABLET | ORAL | 2 refills | Status: DC

## 2022-04-08 MED ORDER — METFORMIN HCL 1000 MG PO TABS: ORAL_TABLET | ORAL | 2 refills | Status: DC

## 2022-04-08 MED ORDER — LISINOPRIL 10 MG PO TABS: ORAL_TABLET | ORAL | 2 refills | Status: DC

## 2022-04-08 MED ORDER — HYDROCHLOROTHIAZIDE 25 MG PO TABS: ORAL_TABLET | ORAL | 2 refills | Status: DC

## 2022-04-08 NOTE — Patient Instructions (Addendum)
Thanks for coming in today.   If any concerns on labs I will let you know.  Follow-up in 6 months.  Let me know if there are questions sooner.    Preventive Care 61-61 Years Old, Female Preventive care refers to lifestyle choices and visits with your health care provider that can promote health and wellness. Preventive care visits are also called wellness exams. What can I expect for my preventive care visit? Counseling Your health care provider may ask you questions about your: Medical history, including: Past medical problems. Family medical history. Pregnancy history. Current health, including: Menstrual cycle. Method of birth control. Emotional well-being. Home life and relationship well-being. Sexual activity and sexual health. Lifestyle, including: Alcohol, nicotine or tobacco, and drug use. Access to firearms. Diet, exercise, and sleep habits. Work and work Statistician. Sunscreen use. Safety issues such as seatbelt and bike helmet use. Physical exam Your health care provider will check your: Height and weight. These may be used to calculate your BMI (body mass index). BMI is a measurement that tells if you are at a healthy weight. Waist circumference. This measures the distance around your waistline. This measurement also tells if you are at a healthy weight and may help predict your risk of certain diseases, such as type 2 diabetes and high blood pressure. Heart rate and blood pressure. Body temperature. Skin for abnormal spots. What immunizations do I need?  Vaccines are usually given at various ages, according to a schedule. Your health care provider will recommend vaccines for you based on your age, medical history, and lifestyle or other factors, such as travel or where you work. What tests do I need? Screening Your health care provider may recommend screening tests for certain conditions. This may include: Lipid and cholesterol levels. Diabetes screening. This is  done by checking your blood sugar (glucose) after you have not eaten for a while (fasting). Pelvic exam and Pap test. Hepatitis B test. Hepatitis C test. HIV (human immunodeficiency virus) test. STI (sexually transmitted infection) testing, if you are at risk. Lung cancer screening. Colorectal cancer screening. Mammogram. Talk with your health care provider about when you should start having regular mammograms. This may depend on whether you have a family history of breast cancer. BRCA-related cancer screening. This may be done if you have a family history of breast, ovarian, tubal, or peritoneal cancers. Bone density scan. This is done to screen for osteoporosis. Talk with your health care provider about your test results, treatment options, and if necessary, the need for more tests. Follow these instructions at home: Eating and drinking  Eat a diet that includes fresh fruits and vegetables, whole grains, lean protein, and low-fat dairy products. Take vitamin and mineral supplements as recommended by your health care provider. Do not drink alcohol if: Your health care provider tells you not to drink. You are pregnant, may be pregnant, or are planning to become pregnant. If you drink alcohol: Limit how much you have to 0-1 drink a day. Know how much alcohol is in your drink. In the [place] one drink equals one 12 oz bottle of beer (355 mL), one 5 oz glass of wine (148 mL), or one 1 oz glass of hard liquor (44 mL). Lifestyle Brush your teeth every morning and night with fluoride toothpaste. Floss one time each day. Exercise for at least 30 minutes 5 or more days each week. Do not use any products that contain nicotine or tobacco. These products include cigarettes, chewing tobacco, and vaping devices,  such as e-cigarettes. If you need help quitting, ask your health care provider. Do not use drugs. If you are sexually active, practice safe sex. Use a condom or other form of protection to  prevent STIs. If you do not wish to become pregnant, use a form of birth control. If you plan to become pregnant, see your health care provider for a prepregnancy visit. Take aspirin only as told by your health care provider. Make sure that you understand how much to take and what form to take. Work with your health care provider to find out whether it is safe and beneficial for you to take aspirin daily. Find healthy ways to manage stress, such as: Meditation, yoga, or listening to music. Journaling. Talking to a trusted person. Spending time with friends and family. Minimize exposure to UV radiation to reduce your risk of skin cancer. Safety Always wear your seat belt while driving or riding in a vehicle. Do not drive: If you have been drinking alcohol. Do not ride with someone who has been drinking. When you are tired or distracted. While texting. If you have been using any mind-altering substances or drugs. Wear a helmet and other protective equipment during sports activities. If you have firearms in your house, make sure you follow all gun safety procedures. Seek help if you have been physically or sexually abused. What's next? Visit your health care provider once a year for an annual wellness visit. Ask your health care provider how often you should have your eyes and teeth checked. Stay up to date on all vaccines. This information is not intended to replace advice given to you by your health care provider. Make sure you discuss any questions you have with your health care provider. Document Revised: 06/28/2020 Document Reviewed: 06/28/2020 Elsevier Patient Education  [place].

## 2022-04-08 NOTE — Progress Notes (Unsigned)
Subjective:  Patient ID: Stephanie Nunez, female    DOB: 1960/11/11  Age: 62 y.o. MRN: [id number]  CC:  Chief Complaint  Patient presents with   Annual Exam    Yearly exam      HPI Stephanie Nunez presents for Annual Exam Last visit with me in March 2023 for acute issues.  Chronic meds last discussed at preoperative assessment in October 2022.  GYN - Dr. Gaetano Net.  Dilatation and curettage, hysteroscopy with [facility] on 11/10/2020.   Staying busy with work. No health changes.    Diabetes: With microalbuminuria.  Treated with Farxiga, metformin.  On ACE inhibitor, statin.  Gabapentin for neuropathic symptoms. Some missed doses on occasion - less than once per week.  No recent need for gabapentin- less foot neuropathic sx's No home readings, no mycotic infections, or other side effects.  Microalbumin: Due Optho, foot exam, pneumovax:  Optho - appt last year - due in next month. [facility].   Lab Results  Component Value Date   HGBA1C 6.7 (H) 08/10/2020   HGBA1C 6.4 (H) 03/10/2020   HGBA1C 6.8 (H) 10/04/2019   Lab Results  Component Value Date   MICROALBUR 1.0 09/09/2015   LDLCALC 123 (H) 08/10/2020   CREATININE 0.55 11/08/2020   Hypertension: Lisinopril 10 mg daily, hydrochlorothiazide 25 mg daily. Home readings: none, no new side effects BP Readings from Last 3 Encounters:  04/08/22 121/64  03/19/21 124/76  01/09/21 107/80   Lab Results  Component Value Date   CREATININE 0.55 11/08/2020   Hyperlipidemia: Lovastatin 20 mg daily, niacin 750 mg CR daily. No myalgias/flushing. Takes CoQ10.  Lab Results  Component Value Date   CHOL 194 08/10/2020   HDL 58.00 08/10/2020   LDLCALC 123 (H) 08/10/2020   TRIG 64.0 08/10/2020   CHOLHDL 3 08/10/2020   Lab Results  Component Value Date   ALT 12 08/10/2020   AST 17 08/10/2020   ALKPHOS 51 08/10/2020   BILITOT 0.9 08/10/2020   Denies depression/anhedonia.  Busy with work.     04/08/2022    2:56 PM 03/19/2021     3:32 PM 01/09/2021   10:40 AM 10/18/2020    2:56 PM 08/10/2020   11:15 AM  Depression screen PHQ 2/9  Decreased Interest 0 0 0 0 0  Down, Depressed, Hopeless 1 0 0 0 0  PHQ - 2 Score 1 0 0 0 0  Altered sleeping 1  0    Tired, decreased energy 0  0    Change in appetite 0  0    Feeling bad or failure about yourself  0  0    Trouble concentrating 0  0    Moving slowly or fidgety/restless 0  0    Suicidal thoughts 0  0    PHQ-9 Score 2  0    Difficult doing work/chores Not difficult at all  Not difficult at all      Health Maintenance  Topic Date Due   Zoster Vaccines- Shingrix (2 of 2) 12/08/2018   OPHTHALMOLOGY EXAM  12/18/2019   FOOT EXAM  04/06/2020   Diabetic kidney evaluation - Urine ACR  10/03/2020   HEMOGLOBIN A1C  02/10/2021   PAP SMEAR-Modifier  02/11/2021   INFLUENZA VACCINE  08/14/2021   COVID-19 Vaccine (6 - 2023-24 season) 09/14/2021   Diabetic kidney evaluation - eGFR measurement  11/08/2021   MAMMOGRAM  07/28/2023   DTaP/Tdap/Td (3 - Td or Tdap) 09/08/2025   COLONOSCOPY (Pts 45-74yrs Insurance coverage will need  to be confirmed)  12/05/2025   Hepatitis C Screening  Completed   HIV Screening  Completed   HPV VACCINES  Aged Out  Colon: colonoscopy 2017 - repeat 55yrs.  GYN - Dr. Gaetano Net, pap normal last year.  Mammogram 07/27/21.   Immunization History  Administered Date(s) Administered   Influenza Inj Mdck Quad Pf 10/13/2018   Influenza Split 10/31/2011   Influenza,inj,Quad PF,6+ Mos 10/18/2012, 09/09/2015, 09/21/2016, 10/07/2017, 10/04/2019   Influenza-Unspecified 11/05/2013, 10/07/2017   Moderna Sars-Covid-2 Vaccination 02/26/2019, 03/27/2019, 11/13/2019, 05/19/2020, 09/29/2020   Pneumococcal Polysaccharide-23 09/09/2015   Tdap 11/13/2004, 09/09/2015   Zoster Recombinat (Shingrix) 10/13/2018   Zoster, Live 05/14/2012  Had second shingrix.  Covid booster - up to date, had newest booster.  RSV vaccine - option given.   No results found. Optho appt last  year - as above.   Dental: with a year.  Alcohol: less than on ce per week - 1 glass red wine.   Tobacco: none.   Exercise:less recently. FITT principle discussed.    History Patient Active Problem List   Diagnosis Date Noted   Epiphora 01/05/2016   HTN (hypertension) 02/01/2011   Hyperlipemia 02/01/2011   Obesity (BMI 30-39.9) 02/01/2011   DM type 2 (diabetes mellitus, type 2) ([place]) 02/01/2011   Past Medical History:  Diagnosis Date   Allergic rhinitis    Cervical polyp    History of gestational diabetes mellitus    Hyperlipidemia    no meds   Hypertension    Type 2 diabetes mellitus ([place])    followed by pcp---  (11-08-2020  pt stated check blood sugar once weekly, fasting sugar -- 145)   Uterine fibroid    Wears glasses    Past Surgical History:  Procedure Laterality Date   COLONOSCOPY  12/06/2015   by stark   [place] N/A 11/10/2020   Procedure: [place];  Surgeon: Everlene Farrier, MD;  Location: [place];  Service: Gynecology;  Laterality: N/A;   LIPOMA EXCISION  1990   Left chest wall   LUMBAR SPINE SURGERY  1992   L4-5   Allergies  Allergen Reactions   Simvastatin     myalgia   Statins     Severe musculoskeletal pains.   Prior to Admission medications   Medication Sig Start Date End Date Taking? Authorizing Provider  aspirin 81 MG tablet Take 81 mg by mouth at bedtime.   Yes [provider]  blood glucose meter kit and supplies Dispense based on insurance preference. once daily as directed. E11.9 02/03/19  Yes [name], MD  cholecalciferol (VITAMIN D) 1000 units tablet Take 1,000 Units by mouth daily.   Yes [provider]  FARXIGA 10 MG TABS tablet TAKE 1 TABLET(10 MG) BY MOUTH DAILY BEFORE BREAKFAST 02/19/22  Yes [name], MD  fluticasone [facility]) 50 MCG/ACT nasal spray SHAKE LIQUID AND USE 1 TO 2 SPRAYS IN EACH NOSTRIL  DAILY 05/08/21  Yes [name], MD  hydrochlorothiazide (HYDRODIURIL) 25 MG tablet TAKE 1 TABLET(25 MG) BY MOUTH DAILY 05/09/21  Yes [name], MD  lisinopril (ZESTRIL) 10 MG tablet TAKE 1 TABLET(10 MG) BY MOUTH DAILY 03/26/22  Yes [name], MD  lovastatin (MEVACOR) 20 MG tablet TAKE 1 TABLET BY MOUTH EVERY DAY. INCREASE UP TO EVERY DAY AS TOLERATED 01/02/22  Yes [name], MD  metFORMIN (GLUCOPHAGE) 1000 MG tablet TAKE 1 TABLET(1000 MG) BY MOUTH TWICE DAILY WITH A  MEAL 03/18/22  Yes [name], MD  Multiple Vitamin (MULTIVITAMIN) tablet Take 1 tablet by mouth daily.   Yes [provider]  niacin (NIASPAN) 750 MG CR tablet TAKE 2 TABLETS(1500 MG) BY MOUTH AT BEDTIME 12/24/21  Yes [name], MD  [facility] ULTRA test strip USE TO TEST AS DIRECTED ONCE DAILY 08/03/19  Yes [name], MD  gabapentin (NEURONTIN) 100 MG capsule TAKE 1 CAPSULE(100 MG) BY MOUTH EVERY EVENING Patient not taking: Reported on 04/08/2022 02/26/22   [name], MD   Social History   Socioeconomic History   Marital status: Married    Spouse name: Gerald Stabs   Number of children: 1   Years of education: 16   Highest education level: Not on file  Occupational History   Occupation: Control and instrumentation engineer  Tobacco Use   Smoking status: Never   Smokeless tobacco: Never  Substance and Sexual Activity   Alcohol use: Not Currently    Comment: seldom   Drug use: Never   Sexual activity: Yes    Partners: Male    Birth control/protection: Condom  Other Topics Concern   Not on file  Social History Narrative   Lives with her husband (a Software engineer) and their daughter.  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  Negative other than as indicated above or in HPI.    Objective:   Vitals:   04/08/22 1457  BP: 121/64  Pulse: 90  Temp: 98.6 F (37 C)  TempSrc: Temporal  SpO2: 97%  Weight: 181 lb 3.2 oz (82.2 kg)  Height: 5\' 2"  (1.575 m)     Physical Exam Constitutional:      Appearance: She is well-developed.  HENT:     Head: Normocephalic and atraumatic.     Right Ear: External ear normal.     Left Ear: External ear normal.  Eyes:     Conjunctiva/sclera: Conjunctivae normal.     Pupils: Pupils are equal, round, and reactive to light.  Neck:     Thyroid: No thyromegaly.  Cardiovascular:     Rate and Rhythm: Normal rate and regular rhythm.     Heart sounds: Normal heart sounds. No murmur heard. Pulmonary:     Effort: Pulmonary effort is normal. No respiratory distress.     Breath sounds: Normal breath sounds. No wheezing.  Abdominal:     General: Bowel sounds are normal.     Palpations: Abdomen is soft.     Tenderness: There is no abdominal tenderness.  Musculoskeletal:        General: No tenderness. Normal range of motion.     Cervical back: Normal range of motion and neck supple.  Lymphadenopathy:     Cervical: No cervical adenopathy.  Skin:    General: Skin is warm and dry.     Findings: No rash.  Neurological:     Mental Status: She is alert and oriented to person, place, and time.  Psychiatric:        Behavior: Behavior normal.        Thought Content: Thought content normal.  Assessment & Plan:  EVERLENA ROEL is a 62 y.o. female . Annual physical exam  - -anticipatory guidance as below in AVS, screening labs above. Health maintenance items as above in HPI discussed/recommended as applicable.   Screening for thyroid disorder - Plan: TSH  Screening for hyperlipidemia - Plan: Comp Met (CMET), Lipid panel  Screening for  hypertension - Plan: Comp Met (CMET)  Type 2 diabetes mellitus with microalbuminuria, without long-term current use of insulin ([place]) - Plan: metFORMIN (GLUCOPHAGE) 1000 MG tablet) - Plan: Urine Microalbumin w/creat. ratio, Comp Met (CMET), CBC w/Diff, HgB A1c, Lipid panel  -  Stable, tolerating current regimen. Medications can be refilled when needed. Labs pending as above.   Diabetic mononeuropathy associated with type 2 diabetes mellitus (HCC) Tingling of both feet  -Improved as above, not needing gabapentin at this time.  Okay to refill if flare symptoms.  Essential hypertension - Plan: hydrochlorothiazide (HYDRODIURIL) 25 MG tablet, lisinopril (ZESTRIL) 10 MG tablet  -  Stable, tolerating current regimen. Medications refilled. Labs pending as above.   Hyperlipidemia, unspecified hyperlipidemia type - Plan: lovastatin (MEVACOR) 20 MG tablet  -  Stable, tolerating current regimen. Medications refilled. Labs pending as above.     Meds ordered this encounter  Medications   hydrochlorothiazide (HYDRODIURIL) 25 MG tablet    Sig: TAKE 1 TABLET(25 MG) BY MOUTH DAILY    Dispense:  90 tablet    Refill:  2   lisinopril (ZESTRIL) 10 MG tablet    Sig: TAKE 1 TABLET(10 MG) BY MOUTH DAILY    Dispense:  90 tablet    Refill:  2   lovastatin (MEVACOR) 20 MG tablet    Sig: TAKE 1 TABLET BY MOUTH EVERY DAY.    Dispense:  90 tablet    Refill:  2   metFORMIN (GLUCOPHAGE) 1000 MG tablet    Sig: TAKE 1 TABLET(1000 MG) BY MOUTH TWICE DAILY WITH A MEAL    Dispense:  180 tablet    Refill:  2   There are no Patient Instructions on file for this visit.    Signed,   Merri Ray, MD [place], [facility] 04/08/22 3:31 PM

## 2022-04-09 ENCOUNTER — Encounter: Payer: Self-pay | Admitting: Family Medicine

## 2022-04-09 LAB — LIPID PANEL
Cholesterol: 196 mg/dL (ref 0–200)
HDL: 64.5 mg/dL (ref >39.00)
LDL Cholesterol: 118 mg/dL — ABNORMAL HIGH (ref 0–99)
NonHDL: 131.88
Total CHOL/HDL Ratio: 3
Triglycerides: 68 mg/dL (ref 0.0–149.0)
VLDL: 13.6 mg/dL (ref 0.0–40.0)

## 2022-04-09 LAB — COMPREHENSIVE METABOLIC PANEL
ALT: 14 U/L (ref 0–35)
AST: 20 U/L (ref 0–37)
Albumin: 4.7 g/dL (ref 3.5–5.2)
Alkaline Phosphatase: 62 U/L (ref 39–117)
BUN: 10 mg/dL (ref 6–23)
CO2: 33 mEq/L — ABNORMAL HIGH (ref 19–32)
Calcium: 9.6 mg/dL (ref 8.4–10.5)
Chloride: 94 mEq/L — ABNORMAL LOW (ref 96–112)
Creatinine, Ser: 0.68 mg/dL (ref 0.40–1.20)
GFR: 94.27 mL/min (ref >60.00)
Glucose, Bld: 88 mg/dL (ref 70–99)
Potassium: 3.7 mEq/L (ref 3.5–5.1)
Sodium: 138 mEq/L (ref 135–145)
Total Bilirubin: 0.8 mg/dL (ref 0.2–1.2)
Total Protein: 8 g/dL (ref 6.0–8.3)

## 2022-04-09 LAB — CBC WITH DIFFERENTIAL/PLATELET
Basophils Absolute: 0 10*3/uL (ref 0.0–0.1)
Basophils Relative: 1.1 % (ref 0.0–3.0)
Eosinophils Absolute: 0.2 10*3/uL (ref 0.0–0.7)
Eosinophils Relative: 5.4 % — ABNORMAL HIGH (ref 0.0–5.0)
HCT: 45.2 % (ref 36.0–46.0)
Hemoglobin: 15.5 g/dL — ABNORMAL HIGH (ref 12.0–15.0)
Lymphocytes Relative: 57.1 % — ABNORMAL HIGH (ref 12.0–46.0)
Lymphs Abs: 2.3 10*3/uL (ref 0.7–4.0)
MCHC: 34.2 g/dL (ref 30.0–36.0)
MCV: 94.2 fl (ref 78.0–100.0)
Monocytes Absolute: 0.4 10*3/uL (ref 0.1–1.0)
Monocytes Relative: 8.9 % (ref 3.0–12.0)
Neutro Abs: 1.1 10*3/uL — ABNORMAL LOW (ref 1.4–7.7)
Neutrophils Relative %: 27.5 % — ABNORMAL LOW (ref 43.0–77.0)
Platelets: 356 10*3/uL (ref 150.0–400.0)
RBC: 4.8 Mil/uL (ref 3.87–5.11)
RDW: 13.2 % (ref 11.5–15.5)
WBC: 4.1 10*3/uL (ref 4.0–10.5)

## 2022-04-09 LAB — MICROALBUMIN / CREATININE URINE RATIO
Creatinine,U: 60 mg/dL
Microalb Creat Ratio: 1.2 mg/g (ref 0.0–30.0)
Microalb, Ur: <0.7 mg/dL (ref 0.0–1.9)

## 2022-04-09 LAB — HEMOGLOBIN A1C: Hgb A1c MFr Bld: 7.7 % — ABNORMAL HIGH (ref 4.6–6.5)

## 2022-04-09 LAB — TSH: TSH: 1.28 u[IU]/mL (ref 0.35–5.50)

## 2022-05-14 ENCOUNTER — Other Ambulatory Visit: Payer: Self-pay | Admitting: Family Medicine

## 2022-05-14 DIAGNOSIS — I10 Essential (primary) hypertension: Secondary | ICD-10-CM

## 2022-06-01 ENCOUNTER — Encounter: Payer: Self-pay | Admitting: Family Medicine

## 2022-07-04 ENCOUNTER — Other Ambulatory Visit (INDEPENDENT_AMBULATORY_CARE_PROVIDER_SITE_OTHER): Payer: BC Managed Care – PPO

## 2022-07-04 ENCOUNTER — Other Ambulatory Visit: Payer: Self-pay | Admitting: Family Medicine

## 2022-07-04 DIAGNOSIS — D582 Other hemoglobinopathies: Secondary | ICD-10-CM

## 2022-07-04 DIAGNOSIS — E785 Hyperlipidemia, unspecified: Secondary | ICD-10-CM | POA: Diagnosis not present

## 2022-07-04 LAB — CBC
HCT: 40.9 % (ref 36.0–46.0)
Hemoglobin: 13.6 g/dL (ref 12.0–15.0)
MCHC: 33.2 g/dL (ref 30.0–36.0)
MCV: 94.9 fl (ref 78.0–100.0)
Platelets: 357 10*3/uL (ref 150.0–400.0)
RBC: 4.31 Mil/uL (ref 3.87–5.11)
RDW: 12.8 % (ref 11.5–15.5)
WBC: 4 10*3/uL (ref 4.0–10.5)

## 2022-07-04 LAB — LIPID PANEL
Cholesterol: 176 mg/dL (ref 0–200)
HDL: 56.2 mg/dL (ref >39.00)
LDL Cholesterol: 106 mg/dL — ABNORMAL HIGH (ref 0–99)
NonHDL: 119.96
Total CHOL/HDL Ratio: 3
Triglycerides: 69 mg/dL (ref 0.0–149.0)
VLDL: 13.8 mg/dL (ref 0.0–40.0)

## 2022-07-04 NOTE — Progress Notes (Signed)
Here for lab only visit, see last labs.  Elevated hemoglobin and adjusted cholesterol med, repeat CBC, lipid panel.

## 2022-07-23 ENCOUNTER — Other Ambulatory Visit: Payer: Self-pay | Admitting: Family Medicine

## 2022-07-23 DIAGNOSIS — E785 Hyperlipidemia, unspecified: Secondary | ICD-10-CM

## 2022-08-06 LAB — HM MAMMOGRAPHY

## 2022-08-08 ENCOUNTER — Encounter: Payer: Self-pay | Admitting: Family Medicine

## 2022-08-14 ENCOUNTER — Encounter (INDEPENDENT_AMBULATORY_CARE_PROVIDER_SITE_OTHER): Payer: Self-pay

## 2022-08-23 ENCOUNTER — Encounter: Payer: Self-pay | Admitting: Family Medicine

## 2022-08-23 MED ORDER — LOVASTATIN 40 MG PO TABS: 40.0000 mg | ORAL_TABLET | Freq: Every day | ORAL | 3 refills | Status: DC

## 2022-09-06 ENCOUNTER — Ambulatory Visit: Payer: BC Managed Care – PPO | Admitting: Family Medicine

## 2022-09-06 ENCOUNTER — Encounter: Payer: Self-pay | Admitting: Family Medicine

## 2022-09-06 VITALS — BP 110/64 | HR 84 | Temp 98.0°F | Resp 16 | Ht 62.0 in | Wt 182.2 lb

## 2022-09-06 DIAGNOSIS — R809 Proteinuria, unspecified: Secondary | ICD-10-CM | POA: Diagnosis not present

## 2022-09-06 DIAGNOSIS — N898 Other specified noninflammatory disorders of vagina: Secondary | ICD-10-CM | POA: Diagnosis not present

## 2022-09-06 DIAGNOSIS — E785 Hyperlipidemia, unspecified: Secondary | ICD-10-CM

## 2022-09-06 DIAGNOSIS — E1129 Type 2 diabetes mellitus with other diabetic kidney complication: Secondary | ICD-10-CM

## 2022-09-06 DIAGNOSIS — I10 Essential (primary) hypertension: Secondary | ICD-10-CM

## 2022-09-06 LAB — COMPREHENSIVE METABOLIC PANEL
ALT: 16 U/L (ref 0–35)
AST: 18 U/L (ref 0–37)
Albumin: 4.5 g/dL (ref 3.5–5.2)
Alkaline Phosphatase: 55 U/L (ref 39–117)
BUN: 13 mg/dL (ref 6–23)
CO2: 34 mEq/L — ABNORMAL HIGH (ref 19–32)
Calcium: 9.5 mg/dL (ref 8.4–10.5)
Chloride: 95 mEq/L — ABNORMAL LOW (ref 96–112)
Creatinine, Ser: 0.69 mg/dL (ref 0.40–1.20)
GFR: 93.67 mL/min (ref >60.00)
Glucose, Bld: 147 mg/dL — ABNORMAL HIGH (ref 70–99)
Potassium: 3.9 mEq/L (ref 3.5–5.1)
Sodium: 139 mEq/L (ref 135–145)
Total Bilirubin: 0.6 mg/dL (ref 0.2–1.2)
Total Protein: 7.4 g/dL (ref 6.0–8.3)

## 2022-09-06 LAB — LIPID PANEL
Cholesterol: 220 mg/dL — ABNORMAL HIGH (ref 0–200)
HDL: 57.3 mg/dL (ref >39.00)
LDL Cholesterol: 143 mg/dL — ABNORMAL HIGH (ref 0–99)
NonHDL: 162.37
Total CHOL/HDL Ratio: 4
Triglycerides: 97 mg/dL (ref 0.0–149.0)
VLDL: 19.4 mg/dL (ref 0.0–40.0)

## 2022-09-06 LAB — HEMOGLOBIN A1C: Hgb A1c MFr Bld: 7.8 % — ABNORMAL HIGH (ref 4.6–6.5)

## 2022-09-06 MED ORDER — METFORMIN HCL 1000 MG PO TABS
ORAL_TABLET | ORAL | 2 refills | Status: DC
Start: 2022-09-06 — End: 2023-01-22

## 2022-09-06 MED ORDER — NIACIN ER (ANTIHYPERLIPIDEMIC) 750 MG PO TBCR
EXTENDED_RELEASE_TABLET | ORAL | 1 refills | Status: DC
Start: 2022-09-06 — End: 2023-01-13

## 2022-09-06 MED ORDER — HYDROCHLOROTHIAZIDE 25 MG PO TABS
ORAL_TABLET | ORAL | 2 refills | Status: DC
Start: 2022-09-06 — End: 2023-04-30

## 2022-09-06 MED ORDER — DAPAGLIFLOZIN PROPANEDIOL 10 MG PO TABS
10.0000 mg | ORAL_TABLET | Freq: Every day | ORAL | 1 refills | Status: DC
Start: 2022-09-06 — End: 2022-10-21

## 2022-09-06 MED ORDER — LISINOPRIL 10 MG PO TABS
ORAL_TABLET | ORAL | 2 refills | Status: DC
Start: 2022-09-06 — End: 2023-03-21

## 2022-09-06 MED ORDER — FLUCONAZOLE 150 MG PO TABS: 150.0000 mg | ORAL_TABLET | Freq: Every day | ORAL | 0 refills | Status: DC

## 2022-09-06 NOTE — Patient Instructions (Addendum)
Thanks for coming in today.  No med changes at this time.  I will let you know if there are any concerns on labs.  We will try to get the records of previous maintenance items that you have performed.  Recheck in 3 months for diabetes, and once levels are stable we can shift back to 6 months.  If symptoms of yeast infection I did prescribe Diflucan that can be filled but if that is recurrent we may need to look at change in your medications.  Take care!

## 2022-09-06 NOTE — Progress Notes (Signed)
Subjective:  Patient ID: Stephanie Nunez, female    DOB: Jan 10, 1961  Age: 62 y.o. MRN: [id number]  CC:  Chief Complaint  Patient presents with   Follow-up    6 Months follow up   HPI Stephanie Nunez presents for   Diabetes: With microalbuminuria, treated with Farxiga, metformin, on ACE inhibitor and statin.  Gabapentin for neuropathy symptoms, not needed at her last visit, infrequent need = once in past week.  Some vaginal itching at times. No discharge - resolves with otc treatment, not to degree of yeast infection in past.  Recent visit with GYN- declined meds for dryness. Pap next year.  No home readings.  Microalbumin: nl ratio 04/08/22.  Optho, foot exam, pneumovax:  Optho in April - [facility] in [place].  Shingrix at pharmacy - [facility].   Diabetic Foot Exam - Simple   Simple Foot Form Visual Inspection No deformities, no ulcerations, no other skin breakdown bilaterally: Yes Sensation Testing Intact to touch and monofilament testing bilaterally: Yes Pulse Check Posterior Tibialis and Dorsalis pulse intact bilaterally: Yes Comments      Lab Results  Component Value Date   HGBA1C 7.7 (H) 04/08/2022   HGBA1C 6.7 (H) 08/10/2020   HGBA1C 6.4 (H) 03/10/2020   Lab Results  Component Value Date   MICROALBUR <0.7 04/08/2022   LDLCALC 106 (H) 07/04/2022   CREATININE 0.68 04/08/2022   Hypertension: Lisinopril 10 mg daily, hydrochlorothiazide 25 mg daily Home readings: none.  No med side effects.  BP Readings from Last 3 Encounters:  09/06/22 110/64  04/08/22 121/64  03/19/21 124/76   Lab Results  Component Value Date   CREATININE 0.68 04/08/2022   Hyperlipidemia: Lovastatin - increased to 40 mg daily prior, niacin 750 mg controlled release daily.  Co-Q10 supplement.  No myalgias, flushing or new side effects. Improving diet and some more exercise - biking. Improved core strength Lab Results  Component Value Date   CHOL 176 07/04/2022   HDL 56.20  07/04/2022   LDLCALC 106 (H) 07/04/2022   TRIG 69.0 07/04/2022   CHOLHDL 3 07/04/2022   Lab Results  Component Value Date   ALT 14 04/08/2022   AST 20 04/08/2022   ALKPHOS 62 04/08/2022   BILITOT 0.8 04/08/2022      History Patient Active Problem List   Diagnosis Date Noted   Epiphora 01/05/2016   HTN (hypertension) 02/01/2011   Hyperlipemia 02/01/2011   Obesity (BMI 30-39.9) 02/01/2011   DM type 2 (diabetes mellitus, type 2) (HCC) 02/01/2011   Past Medical History:  Diagnosis Date   Allergic rhinitis    Cervical polyp    History of gestational diabetes mellitus    Hyperlipidemia    no meds   Hypertension    Type 2 diabetes mellitus (HCC)    followed by pcp---  (11-08-2020  pt stated check blood sugar once weekly, fasting sugar -- 145)   Uterine fibroid    Wears glasses    Past Surgical History:  Procedure Laterality Date   COLONOSCOPY  12/06/2015   by stark   DILATATION & CURETTAGE/HYSTEROSCOPY WITH MYOSURE N/A 11/10/2020   Procedure: DILATATION & CURETTAGE/HYSTEROSCOPY WITH  MYOSURE;  Surgeon: Harold Hedge, MD;  Location: [facility];  Service: Gynecology;  Laterality: N/A;   LIPOMA EXCISION  1990   Left chest wall   LUMBAR SPINE SURGERY  1992   L4-5   Allergies  Allergen Reactions   Simvastatin     myalgia   Statins  Severe musculoskeletal pains.   Prior to Admission medications   Medication Sig Start Date End Date Taking? Authorizing Provider  aspirin 81 MG tablet Take 81 mg by mouth at bedtime.   Yes [provider]  blood glucose meter kit and supplies Dispense based on insurance preference. once daily as directed. E11.9 02/03/19  Yes Shade Flood, MD  cholecalciferol (VITAMIN D) 1000 units tablet Take 1,000 Units by mouth daily.   Yes [provider]  FARXIGA 10 MG TABS tablet TAKE 1 TABLET(10 MG) BY MOUTH DAILY BEFORE BREAKFAST 02/19/22  Yes Shade Flood, MD  fluticasone [facility]) 50 MCG/ACT nasal spray  SHAKE LIQUID AND USE 1 TO 2 SPRAYS IN EACH NOSTRIL DAILY 05/08/21  Yes Shade Flood, MD  gabapentin (NEURONTIN) 100 MG capsule TAKE 1 CAPSULE(100 MG) BY MOUTH EVERY EVENING 02/26/22  Yes Shade Flood, MD  hydrochlorothiazide (HYDRODIURIL) 25 MG tablet TAKE 1 TABLET(25 MG) BY MOUTH DAILY 05/14/22  Yes Shade Flood, MD  lisinopril (ZESTRIL) 10 MG tablet TAKE 1 TABLET(10 MG) BY MOUTH DAILY 04/08/22  Yes Shade Flood, MD  lovastatin (MEVACOR) 40 MG tablet Take 1 tablet (40 mg total) by mouth at bedtime. 08/23/22  Yes Sheliah Hatch, MD  metFORMIN (GLUCOPHAGE) 1000 MG tablet TAKE 1 TABLET(1000 MG) BY MOUTH TWICE DAILY WITH A MEAL 04/08/22  Yes Shade Flood, MD  Multiple Vitamin (MULTIVITAMIN) tablet Take 1 tablet by mouth daily.   Yes [provider]  niacin (NIASPAN) 750 MG CR tablet TAKE 2 TABLETS(1500 MG) BY MOUTH AT BEDTIME 07/23/22  Yes Shade Flood, MD  [facility] ULTRA test strip USE TO TEST AS DIRECTED ONCE DAILY 08/03/19  Yes Shade Flood, MD   Social History   Socioeconomic History   Marital status: Married    Spouse name: Thayer Ohm   Number of children: 1   Years of education: 16   Highest education level: Not on file  Occupational History   Occupation: Geologist, engineering  Tobacco Use   Smoking status: Never   Smokeless tobacco: Never  Substance and Sexual Activity   Alcohol use: Not Currently    Comment: seldom   Drug use: Never   Sexual activity: Yes    Partners: Male    Birth control/protection: Condom  Other Topics Concern   Not on file  Social History Narrative   Lives with her husband (a Teacher, early years/pre) and their daughter.  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, environmental   Social Determinants of Corporate investment banker Strain: Not on file  Food Insecurity: No Food Insecurity (03/16/2021)   Received [facility]   Hunger Vital Sign    Worried About Running Out of Food in the Last Year: Never true    Ran  Out of Food in the Last Year: Never true  Transportation Needs: Not on file  Physical Activity: Not on file  Stress: Not on file  Social Connections: Unknown (05/25/2021)   Received from [facility]   Social Network    Social Network: Not on file  Intimate Partner Violence: Unknown (04/16/2021)   Received from [facility]   HITS    Physically Hurt: Not on file    Insult or Talk Down To: Not on file    Threaten Physical Harm: Not on file    Scream or Curse: Not on file    Review of Systems  Constitutional:  Negative for fatigue and unexpected weight change.  Respiratory:  Negative for chest tightness and shortness of breath.   Cardiovascular:  Negative for chest pain, palpitations and leg swelling.  Gastrointestinal:  Negative for abdominal pain and blood in stool.  Neurological:  Negative for dizziness, syncope, light-headedness and headaches.     Objective:   Vitals:   09/06/22 0806  BP: 110/64  Pulse: 84  Resp: 16  Temp: 98 F (36.7 C)  TempSrc: Oral  SpO2: 98%  Weight: 182 lb 3.2 oz (82.6 kg)  Height: 5\' 2"  (1.575 m)     Physical Exam Vitals reviewed.  Constitutional:      Appearance: Normal appearance. She is well-developed.  HENT:     Head: Normocephalic and atraumatic.  Eyes:     Conjunctiva/sclera: Conjunctivae normal.     Pupils: Pupils are equal, round, and reactive to light.  Neck:     Vascular: No carotid bruit.  Cardiovascular:     Rate and Rhythm: Normal rate and regular rhythm.     Heart sounds: Normal heart sounds.  Pulmonary:     Effort: Pulmonary effort is normal.     Breath sounds: Normal breath sounds.  Abdominal:     Palpations: Abdomen is soft. There is no pulsatile mass.     Tenderness: There is no abdominal tenderness.  Musculoskeletal:     Right lower leg: No edema.     Left lower leg: No edema.  Skin:    General: Skin is warm and dry.  Neurological:     Mental Status: She is alert and oriented to person, place, and time.   Psychiatric:        Mood and Affect: Mood normal.        Behavior: Behavior normal.        Assessment & Plan:  Stephanie Nunez is a 62 y.o. female . Type 2 diabetes mellitus with microalbuminuria, without long-term current use of insulin (HCC) - Plan: dapagliflozin propanediol (FARXIGA) 10 MG TABS tablet, metFORMIN (GLUCOPHAGE) 1000 MG tablet, Hemoglobin A1c, Comprehensive metabolic panel  -Tolerating current regimen, some intermittent vaginal itching but no true symptoms of yeast infection otherwise.  Could be related to atrophic vaginitis and has discussed with GYN, deferred meds.  Diflucan.  If needed for true yeast infection symptoms, with RTC precautions if that is recurrent as may need to stop SGLT2.  Check labs and adjust plan accordingly.  Hyperlipidemia, unspecified hyperlipidemia type - Plan: niacin (NIASPAN) 750 MG CR tablet, Lipid panel  -Tolerating higher dose statin, continue same.  Check updated labs.  Essential hypertension - Plan: hydrochlorothiazide (HYDRODIURIL) 25 MG tablet, lisinopril (ZESTRIL) 10 MG tablet  -  Stable, tolerating current regimen. Medications refilled. Labs pending as above.   Vaginal itching  -As above, likely related in part due to atrophic vaginitis, option of Diflucan if candidal symptoms.  Meds ordered this encounter  Medications   fluconazole (DIFLUCAN) 150 MG tablet    Sig: Take 1 tablet (150 mg total) by mouth daily.    Dispense:  1 tablet    Refill:  0   dapagliflozin propanediol (FARXIGA) 10 MG TABS tablet    Sig: Take 1 tablet (10 mg total) by mouth daily.    Dispense:  90 tablet    Refill:  1   hydrochlorothiazide (HYDRODIURIL) 25 MG tablet    Sig: TAKE 1 TABLET(25 MG) BY MOUTH DAILY    Dispense:  90 tablet    Refill:  2   lisinopril (ZESTRIL) 10 MG tablet    Sig: TAKE  1 TABLET(10 MG) BY MOUTH DAILY    Dispense:  90 tablet    Refill:  2   metFORMIN (GLUCOPHAGE) 1000 MG tablet    Sig: TAKE 1 TABLET(1000 MG) BY MOUTH TWICE  DAILY WITH A MEAL    Dispense:  180 tablet    Refill:  2   niacin (NIASPAN) 750 MG CR tablet    Sig: TAKE 2 TABLETS(1500 MG) BY MOUTH AT BEDTIME    Dispense:  180 tablet    Refill:  1   Patient Instructions  Thanks for coming in today.  No med changes at this time.  I will let you know if there are any concerns on labs.  We will try to get the records of previous maintenance items that you have performed.  Recheck in 3 months for diabetes, and once levels are stable we can shift back to 6 months.  If symptoms of yeast infection I did prescribe Diflucan that can be filled but if that is recurrent we may need to look at change in your medications.  Take care!    Signed,   Meredith Staggers, MD [facility], [facility] 09/06/22 8:35 AM

## 2022-09-10 ENCOUNTER — Telehealth: Payer: Self-pay

## 2022-09-10 NOTE — Telephone Encounter (Signed)
-----   Message from Shade Flood sent at 09/10/2022  1:26 PM EDT -----  Diabetes test is stable from March but still needs to be below 7.  Borderline elevated CO2 is not likely clinically significant.  Blood sugar was elevated at 147 but other electrolytes looked okay.  Cholesterol was also elevated, would like to see LDL about half of that reading with goal of less than 70.  Please schedule visit in the next week or 2, MyChart visit is fine, so we can discuss action plan regarding diabetes and cholesterol treatment.  Thanks.

## 2022-09-17 ENCOUNTER — Telehealth: Payer: Self-pay

## 2022-09-17 NOTE — Telephone Encounter (Signed)
Diabetes test is stable from March but still needs to be below 7.  Borderline elevated CO2 is not likely clinically significant.  Blood sugar was elevated at 147 but other electrolytes looked okay.  Cholesterol was also elevated, would like to see LDL about half of that reading with goal of less than 70.  Please schedule visit in the next week or 2, MyChart visit is fine, so we can discuss action plan regarding diabetes and cholesterol treatment.  Thanks.

## 2022-09-17 NOTE — Telephone Encounter (Signed)
-----   Message from Shade Flood sent at 09/17/2022  1:40 PM EDT ----- Results sent by MyChart, but appears patient has not yet reviewed those results.  Please call and make sure they have either seen note or discuss result note.  Thanks.

## 2022-09-18 NOTE — Telephone Encounter (Signed)
 Called patient to discuss lab work, no answer, left a message for the patient to call back to discuss or review by MyChart if that is prefered.

## 2022-09-19 NOTE — Telephone Encounter (Signed)
Letter out

## 2022-09-27 ENCOUNTER — Encounter: Payer: Self-pay | Admitting: Family Medicine

## 2022-09-27 ENCOUNTER — Other Ambulatory Visit: Payer: Self-pay | Admitting: Family Medicine

## 2022-09-27 ENCOUNTER — Telehealth (INDEPENDENT_AMBULATORY_CARE_PROVIDER_SITE_OTHER): Payer: BC Managed Care – PPO | Admitting: Family Medicine

## 2022-09-27 DIAGNOSIS — E1129 Type 2 diabetes mellitus with other diabetic kidney complication: Secondary | ICD-10-CM | POA: Diagnosis not present

## 2022-09-27 DIAGNOSIS — Z7984 Long term (current) use of oral hypoglycemic drugs: Secondary | ICD-10-CM | POA: Diagnosis not present

## 2022-09-27 DIAGNOSIS — E785 Hyperlipidemia, unspecified: Secondary | ICD-10-CM

## 2022-09-27 MED ORDER — SITAGLIPTIN PHOSPHATE 100 MG PO TABS
100.0000 mg | ORAL_TABLET | Freq: Every day | ORAL | 1 refills | Status: DC
Start: 2022-09-27 — End: 2023-01-13

## 2022-09-27 MED ORDER — ROSUVASTATIN CALCIUM 10 MG PO TABS: 10.0000 mg | ORAL_TABLET | Freq: Every day | ORAL | 1 refills | Status: DC

## 2022-09-27 NOTE — Patient Instructions (Addendum)
Try crestor instead of lovastatin, but remain on the COQ10 supplement to see if crestor is tolerated. If so, then Crestor should be more effective for your cholesterol and should recheck levels in 6 weeks.  Patient is results, can either remain on same dose of Crestor or increases slightly.  If you have any side effects with that medication, return to lovastatin and let me know.  For your 6 week labs, lab visit is fine - scheduled at our office or walk-in to [facility] - Inpatient location:  [facility] or xray: Walk in 8:30-4:30 during weekdays, no appointment needed [address]  [place], [place] [address]  A1c was still elevated, I think adding Januvia will be sufficient.  Continue other medications and new prescription was sent to your pharmacy.  We can recheck your A1c in the next 3 months.  Let me know if there are any questions and take care.

## 2022-09-27 NOTE — Progress Notes (Signed)
Virtual Visit via Video Note  I connected with Stephanie Nunez on 09/27/22 at 12:10 PM by a video enabled telemedicine application and verified that I am speaking with the correct person using two identifiers.  Initial portion of visit through video but due to some connection difficulties changed to audio only for final portion of visit. 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    I discussed the limitations, risks, security and privacy concerns of performing an evaluation and management service by telephone and the availability of in person appointments. I also discussed with the patient that there may be a patient responsible charge related to this service. The patient expressed understanding and agreed to proceed, consent obtained  Chief complaint:  Chief Complaint  Patient presents with   Results    Lab results from last visit. Did inform her of result note and reason of visit was for diabetes and cholesterol management.     History of Present Illness: Stephanie Nunez is a 62 y.o. female  Diabetes: With microalbuminuria treated with Farxiga, metformin, and on ACE inhibitor and statin.  Gabapentin for neuropathy as needed, infrequent need.  We did discuss some intermittent vaginal itching at times but no discharge and resolves with over-the-counter treatment when discussed at her August 23 visit, suspected component of atrophic vaginitis, not mycotic infection..  A1c still elevated on most recent testing.   Lab Results  Component Value Date   HGBA1C 7.8 (H) 09/06/2022   HGBA1C 7.7 (H) 04/08/2022   HGBA1C 6.7 (H) 08/10/2020   Lab Results  Component Value Date   MICROALBUR <0.7 04/08/2022   LDLCALC 143 (H) 09/06/2022   CREATININE 0.69 09/06/2022   Hyperlipidemia: Treated with lovastatin, 40 mg daily (dose increased earlier in the year) and niacin 750 mg controlled release daily along with co-Q10 supplement, tolerating at  her last visit but persistent elevated LDL. Some difficulty with other statins (myopathy symptoms, but had not taken CoQ 10 at the time and no myalgias on current regimen).  Lab Results  Component Value Date   CHOL 220 (H) 09/06/2022   HDL 57.30 09/06/2022   LDLCALC 143 (H) 09/06/2022   TRIG 97.0 09/06/2022   CHOLHDL 4 09/06/2022   Lab Results  Component Value Date   ALT 16 09/06/2022   AST 18 09/06/2022   ALKPHOS 55 09/06/2022   BILITOT 0.6 09/06/2022      Patient Active Problem List   Diagnosis Date Noted   Epiphora 01/05/2016   HTN (hypertension) 02/01/2011   Hyperlipemia 02/01/2011   Obesity (BMI 30-39.9) 02/01/2011   DM type 2 (diabetes mellitus, type 2) (HCC) 02/01/2011   Past Medical History:  Diagnosis Date   Allergic rhinitis    Cervical polyp    History of gestational diabetes mellitus    Hyperlipidemia    no meds   Hypertension    Type 2 diabetes mellitus (HCC)    followed by pcp---  (11-08-2020  pt stated check blood sugar once weekly, fasting sugar -- 145)   Uterine fibroid    Wears glasses    Past Surgical History:  Procedure Laterality Date   COLONOSCOPY  12/06/2015   by stark   DILATATION & CURETTAGE/HYSTEROSCOPY WITH MYOSURE N/A 11/10/2020   Procedure: DILATATION & CURETTAGE/HYSTEROSCOPY WITH  MYOSURE;  Surgeon: Harold Hedge, MD;  Location: [facility];  Service: Gynecology;  Laterality: N/A;   LIPOMA EXCISION  1990   Left chest wall  LUMBAR SPINE SURGERY  1992   L4-5   Allergies  Allergen Reactions   Simvastatin     myalgia   Statins     Severe musculoskeletal pains.   Prior to Admission medications   Medication Sig Start Date End Date Taking? Authorizing Provider  aspirin 81 MG tablet Take 81 mg by mouth at bedtime.   Yes [provider]  blood glucose meter kit and supplies Dispense based on insurance preference. once daily as directed. E11.9 02/03/19  Yes Shade Flood, MD  cholecalciferol (VITAMIN D)  1000 units tablet Take 1,000 Units by mouth daily.   Yes [provider]  dapagliflozin propanediol (FARXIGA) 10 MG TABS tablet Take 1 tablet (10 mg total) by mouth daily. 09/06/22  Yes Shade Flood, MD  fluconazole (DIFLUCAN) 150 MG tablet Take 1 tablet (150 mg total) by mouth daily. 09/06/22  Yes Shade Flood, MD  fluticasone (FLONASE) 50 MCG/ACT nasal spray SHAKE LIQUID AND USE 1 TO 2 SPRAYS IN EACH NOSTRIL DAILY 05/08/21  Yes Shade Flood, MD  gabapentin (NEURONTIN) 100 MG capsule TAKE 1 CAPSULE(100 MG) BY MOUTH EVERY EVENING 02/26/22  Yes Shade Flood, MD  hydrochlorothiazide (HYDRODIURIL) 25 MG tablet TAKE 1 TABLET(25 MG) BY MOUTH DAILY 09/06/22  Yes Shade Flood, MD  lisinopril (ZESTRIL) 10 MG tablet TAKE 1 TABLET(10 MG) BY MOUTH DAILY 09/06/22  Yes Shade Flood, MD  lovastatin (MEVACOR) 40 MG tablet Take 1 tablet (40 mg total) by mouth at bedtime. 08/23/22  Yes Sheliah Hatch, MD  metFORMIN (GLUCOPHAGE) 1000 MG tablet TAKE 1 TABLET(1000 MG) BY MOUTH TWICE DAILY WITH A MEAL 09/06/22  Yes Shade Flood, MD  Multiple Vitamin (MULTIVITAMIN) tablet Take 1 tablet by mouth daily.   Yes [provider]  niacin (NIASPAN) 750 MG CR tablet TAKE 2 TABLETS(1500 MG) BY MOUTH AT BEDTIME 09/06/22  Yes Shade Flood, MD  [facility] ULTRA test strip USE TO TEST AS DIRECTED ONCE DAILY 08/03/19  Yes Shade Flood, MD   Social History   Socioeconomic History   Marital status: Married    Spouse name: Thayer Ohm   Number of children: 1   Years of education: 16   Highest education level: Not on file  Occupational History   Occupation: Geologist, engineering  Tobacco Use   Smoking status: Never   Smokeless tobacco: Never  Substance and Sexual Activity   Alcohol use: Not Currently    Comment: seldom   Drug use: Never   Sexual activity: Yes    Partners: Male    Birth control/protection: Condom  Other Topics Concern   Not on file  Social History Narrative    Lives with her husband (a Teacher, early years/pre) and their daughter.  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, environmental   Social Determinants of Corporate investment banker Strain: Not on file  Food Insecurity: No Food Insecurity (03/16/2021)   Received [facility], [facility]   Hunger Vital Sign    Worried About Running Out of Food in the Last Year: Never true    Ran Out of Food in the Last Year: Never true  Transportation Needs: Not on file  Physical Activity: Not on file  Stress: Not on file  Social Connections: Unknown (05/25/2021)   Received from [facility], [facility]   Social Network    Social Network: Not on file  Intimate Partner Violence: Unknown (04/16/2021)   [facility],  [facility]   HITS    Physically Hurt: Not on file    Insult or Talk Down To: Not on file    Threaten Physical Harm: Not on file    Scream or Curse: Not on file    Observations/Objective: There were no vitals filed for this visit. Nontoxic appearance on video, speaking in full sentences, no respiratory distress, euthymic mood, coherent answers, understanding expressed of plan with all questions answered.  Assessment and Plan: Type 2 diabetes mellitus with microalbuminuria, without long-term current use of insulin (HCC) - Plan: sitaGLIPtin (JANUVIA) 100 MG tablet  -Insufficient control of diabetes, options discussed including GLP-1 versus DPP 4, and based on current A1c I think DPP 4 will be sufficient.  Add Januvia, potential side effects and risks of meds have been discussed, continue metformin, Farxiga same doses for now.  Recheck levels in 3 months.  Hyperlipidemia, unspecified hyperlipidemia type - Plan: rosuvastatin (CRESTOR) 10 MG tablet  -Goal LDL closer to 70, lovastatin has not been sufficient.  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  Will try low-dose Crestor to see if that is tolerated  with use of co-Q10 and if so lab visit in 6 weeks for repeat lipids.  If intolerant return to lovastatin and to let me know.  Follow Up Instructions:    I discussed the assessment and treatment plan with the patient. The patient was provided an opportunity to ask questions and all were answered. The patient agreed with the plan and demonstrated an understanding of the instructions.   The patient was advised to call back or seek an in-person evaluation if the symptoms worsen or if the condition fails to improve as anticipated.   Shade Flood, MD

## 2022-10-09 ENCOUNTER — Ambulatory Visit: Payer: BC Managed Care – PPO | Admitting: Family Medicine

## 2022-10-20 ENCOUNTER — Other Ambulatory Visit: Payer: Self-pay | Admitting: Family Medicine

## 2022-10-20 DIAGNOSIS — E1129 Type 2 diabetes mellitus with other diabetic kidney complication: Secondary | ICD-10-CM

## 2022-11-21 ENCOUNTER — Other Ambulatory Visit: Payer: Self-pay | Admitting: Family Medicine

## 2022-11-21 DIAGNOSIS — E785 Hyperlipidemia, unspecified: Secondary | ICD-10-CM

## 2022-12-11 ENCOUNTER — Ambulatory Visit: Payer: BC Managed Care – PPO | Admitting: Family Medicine

## 2022-12-11 ENCOUNTER — Telehealth: Payer: Self-pay | Admitting: Family Medicine

## 2022-12-11 NOTE — Telephone Encounter (Signed)
[facility] [place] Night - C Client Site [facility] - Night Provider Meredith Staggers- MD Contact Type Call Who Is Calling Patient / Member / Family / Caregiver Caller Name Kaleia Reindl Caller Phone Number [phone] Patient Name [name] Patient DOB [birth year] Call Type Message Only Information Provided Reason for Call Request to [facility] Appointment Initial Comment Caller states needs her cancel her appointment for tomorrow at 8am. Patient request to speak to RN No Additional Comment Office hours provided. Declined triage. Disp. Time Disposition Final User 12/10/2022 6:13:05 PM General Information Provided Yes Bertram Savin Call Closed By: Bertram Savin Transaction Date/Time: 12/10/2022 6:10:06 PM (ET)  Pt has been rescheduled

## 2023-01-13 ENCOUNTER — Encounter: Payer: Self-pay | Admitting: Family Medicine

## 2023-01-13 ENCOUNTER — Ambulatory Visit: Payer: BC Managed Care – PPO | Admitting: Family Medicine

## 2023-01-13 VITALS — BP 118/82 | HR 83 | Temp 98.1°F | Ht 62.0 in | Wt 183.0 lb

## 2023-01-13 DIAGNOSIS — E785 Hyperlipidemia, unspecified: Secondary | ICD-10-CM | POA: Diagnosis not present

## 2023-01-13 DIAGNOSIS — R202 Paresthesia of skin: Secondary | ICD-10-CM

## 2023-01-13 DIAGNOSIS — I10 Essential (primary) hypertension: Secondary | ICD-10-CM

## 2023-01-13 DIAGNOSIS — R809 Proteinuria, unspecified: Secondary | ICD-10-CM

## 2023-01-13 DIAGNOSIS — E1129 Type 2 diabetes mellitus with other diabetic kidney complication: Secondary | ICD-10-CM

## 2023-01-13 DIAGNOSIS — E1141 Type 2 diabetes mellitus with diabetic mononeuropathy: Secondary | ICD-10-CM

## 2023-01-13 DIAGNOSIS — Z7984 Long term (current) use of oral hypoglycemic drugs: Secondary | ICD-10-CM

## 2023-01-13 LAB — LIPID PANEL
Cholesterol: 197 mg/dL (ref 0–200)
HDL: 61.6 mg/dL (ref >39.00)
LDL Cholesterol: 121 mg/dL — ABNORMAL HIGH (ref 0–99)
NonHDL: 135.15
Total CHOL/HDL Ratio: 3
Triglycerides: 71 mg/dL (ref 0.0–149.0)
VLDL: 14.2 mg/dL (ref 0.0–40.0)

## 2023-01-13 LAB — COMPREHENSIVE METABOLIC PANEL
ALT: 17 U/L (ref 0–35)
AST: 21 U/L (ref 0–37)
Albumin: 4.6 g/dL (ref 3.5–5.2)
Alkaline Phosphatase: 50 U/L (ref 39–117)
BUN: 14 mg/dL (ref 6–23)
CO2: 31 meq/L (ref 19–32)
Calcium: 9.3 mg/dL (ref 8.4–10.5)
Chloride: 95 meq/L — ABNORMAL LOW (ref 96–112)
Creatinine, Ser: 0.66 mg/dL (ref 0.40–1.20)
GFR: 94.44 mL/min (ref >60.00)
Glucose, Bld: 95 mg/dL (ref 70–99)
Potassium: 3.5 meq/L (ref 3.5–5.1)
Sodium: 137 meq/L (ref 135–145)
Total Bilirubin: 0.9 mg/dL (ref 0.2–1.2)
Total Protein: 7.6 g/dL (ref 6.0–8.3)

## 2023-01-13 LAB — HEMOGLOBIN A1C: Hgb A1c MFr Bld: 7.6 % — ABNORMAL HIGH (ref 4.6–6.5)

## 2023-01-13 MED ORDER — NIACIN ER (ANTIHYPERLIPIDEMIC) 750 MG PO TBCR: EXTENDED_RELEASE_TABLET | ORAL | 1 refills | Status: DC

## 2023-01-13 MED ORDER — ROSUVASTATIN CALCIUM 10 MG PO TABS
10.0000 mg | ORAL_TABLET | Freq: Every day | ORAL | 1 refills | Status: DC
Start: 2023-01-13 — End: 2023-04-30

## 2023-01-13 MED ORDER — SITAGLIPTIN PHOSPHATE 100 MG PO TABS: 100.0000 mg | ORAL_TABLET | Freq: Every day | ORAL | 1 refills | Status: DC

## 2023-01-13 MED ORDER — DAPAGLIFLOZIN PROPANEDIOL 10 MG PO TABS
10.0000 mg | ORAL_TABLET | Freq: Every day | ORAL | 1 refills | Status: DC
Start: 2023-01-13 — End: 2023-04-30

## 2023-01-13 MED ORDER — GABAPENTIN 100 MG PO CAPS: ORAL_CAPSULE | ORAL | 0 refills | Status: AC

## 2023-01-13 NOTE — Progress Notes (Signed)
Subjective:  Patient ID: Stephanie Nunez, female    DOB: [birth year]  Age: 62 y.o. MRN: [id number]  CC:  Chief Complaint  Patient presents with   Medical Management of Chronic Issues    Pt is well no concerns     HPI Stephanie Nunez presents for   Diabetes: Treated with Marcelline Deist, metformin, Januvia that was started in September.  She is on Crestor 10 mg daily for statin, lovastatin was not sufficient previously.  Crestor ordered in September. Options of meds discussed for diabetes in September and shows DPP 4 over GLP-1 at that time. Denies mycotic or urinary tract infection symptoms typically - possible early yeast infection sx's - dryness. No d/c and has read about vaginal atrophy- has discussed with GYN - Dr. Henderson Cloud.   No history of DKA. Home  readings fasting: none recent Postprandial: none recent.   Symptomatic lows: no symptoms.  Gabapentin at bedtime when needed - effective.  Microalbumin: Normal with normal ratio 04/08/2022 Optho, foot exam, pneumovax:  Optho exam: in April - [facility].  Flu vax, and covid booster 9/20.  Cervical CA screening at GYN.   Lab Results  Component Value Date   HGBA1C 7.8 (H) 09/06/2022   HGBA1C 7.7 (H) 04/08/2022   HGBA1C 6.7 (H) 08/10/2020   Lab Results  Component Value Date   MICROALBUR <0.7 04/08/2022   LDLCALC 143 (H) 09/06/2022   CREATININE 0.69 09/06/2022   Hyperlipidemia: Uncontrolled on lovastatin with diabetes LDL goal under 70.  Statin myopathy in the past but had not experience upon starting co-Q10 supplement.  Lovastatin was changed to Crestor in September.  Plan for lab visit, has not had repeat lipids since August. Took crestor for 1 month, then went back to lovastatin - misunderstanding on plan.  No side effects on Crestor and niacin.   Lab Results  Component Value Date   CHOL 220 (H) 09/06/2022   HDL 57.30 09/06/2022   LDLCALC 143 (H) 09/06/2022   TRIG 97.0 09/06/2022   CHOLHDL 4 09/06/2022   Lab Results  Component  Value Date   ALT 16 09/06/2022   AST 18 09/06/2022   ALKPHOS 55 09/06/2022   BILITOT 0.6 09/06/2022   Hypertension: Stable with HCTZ, lisinopril, no new med side effects. BP Readings from Last 3 Encounters:  01/13/23 118/82  09/06/22 110/64  04/08/22 121/64   Lab Results  Component Value Date   CREATININE 0.69 09/06/2022    History Patient Active Problem List   Diagnosis Date Noted   Epiphora 01/05/2016   HTN (hypertension) 02/01/2011   Hyperlipemia 02/01/2011   Obesity (BMI 30-39.9) 02/01/2011   DM type 2 (diabetes mellitus, type 2) (HCC) 02/01/2011   Past Medical History:  Diagnosis Date   Allergic rhinitis    Cervical polyp    History of gestational diabetes mellitus    Hyperlipidemia    no meds   Hypertension    Type 2 diabetes mellitus (HCC)    followed by pcp---  (11-08-2020  pt stated check blood sugar once weekly, fasting sugar -- 145)   Uterine fibroid    Wears glasses    Past Surgical History:  Procedure Laterality Date   COLONOSCOPY  12/06/2015   by stark   DILATATION & CURETTAGE/HYSTEROSCOPY WITH MYOSURE N/A 11/10/2020   Procedure: DILATATION & CURETTAGE/HYSTEROSCOPY WITH  MYOSURE;  Surgeon: Harold Hedge, MD;  Location: [facility];  Service: Gynecology;  Laterality: N/A;   LIPOMA EXCISION  1990   Left chest wall  LUMBAR SPINE SURGERY  1992   L4-5   Allergies  Allergen Reactions   Simvastatin     myalgia   Statins     Severe musculoskeletal pains.   Prior to Admission medications   Medication Sig Start Date End Date Taking? Authorizing Provider  aspirin 81 MG tablet Take 81 mg by mouth at bedtime.   Yes [provider]  blood glucose meter kit and supplies Dispense based on insurance preference. once daily as directed. E11.9 02/03/19  Yes Shade Flood, MD  cholecalciferol (VITAMIN D) 1000 units tablet Take 1,000 Units by mouth daily.   Yes [provider]  FARXIGA 10 MG TABS tablet TAKE 1 TABLET(10 MG)  BY MOUTH DAILY BEFORE BREAKFAST 10/21/22  Yes Shade Flood, MD  fluconazole (DIFLUCAN) 150 MG tablet Take 1 tablet (150 mg total) by mouth daily. 09/06/22  Yes Shade Flood, MD  fluticasone (FLONASE) 50 MCG/ACT nasal spray SHAKE LIQUID AND USE 1 TO 2 SPRAYS IN EACH NOSTRIL DAILY 05/08/21  Yes Shade Flood, MD  gabapentin (NEURONTIN) 100 MG capsule TAKE 1 CAPSULE(100 MG) BY MOUTH EVERY EVENING 02/26/22  Yes Shade Flood, MD  hydrochlorothiazide (HYDRODIURIL) 25 MG tablet TAKE 1 TABLET(25 MG) BY MOUTH DAILY 09/06/22  Yes Shade Flood, MD  lisinopril (ZESTRIL) 10 MG tablet TAKE 1 TABLET(10 MG) BY MOUTH DAILY 09/06/22  Yes Shade Flood, MD  metFORMIN (GLUCOPHAGE) 1000 MG tablet TAKE 1 TABLET(1000 MG) BY MOUTH TWICE DAILY WITH A MEAL 09/06/22  Yes Shade Flood, MD  Multiple Vitamin (MULTIVITAMIN) tablet Take 1 tablet by mouth daily.   Yes [provider]  niacin (NIASPAN) 750 MG CR tablet TAKE 2 TABLETS(1500 MG) BY MOUTH AT BEDTIME 09/06/22  Yes Shade Flood, MD  [facility] ULTRA test strip USE TO TEST AS DIRECTED ONCE DAILY 08/03/19  Yes Shade Flood, MD  rosuvastatin (CRESTOR) 10 MG tablet TAKE 1 TABLET(10 MG) BY MOUTH DAILY 11/21/22  Yes Shade Flood, MD  sitaGLIPtin (JANUVIA) 100 MG tablet Take 1 tablet (100 mg total) by mouth daily. 09/27/22  Yes Shade Flood, MD   Social History   Socioeconomic History   Marital status: Married    Spouse name: Thayer Ohm   Number of children: 1   Years of education: 16   Highest education level: Not on file  Occupational History   Occupation: Geologist, engineering  Tobacco Use   Smoking status: Never   Smokeless tobacco: Never  Substance and Sexual Activity   Alcohol use: Not Currently    Comment: seldom   Drug use: Never   Sexual activity: Yes    Partners: Male    Birth control/protection: Condom  Other Topics Concern   Not on file  Social History Narrative   Lives with her husband (a Teacher, early years/pre) and  their daughter.  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, environmental   Social Drivers of Corporate investment banker Strain: Not on file  Food Insecurity: No Food Insecurity (03/16/2021)   Received from [facility], [facility]   Hunger Vital Sign    Worried About Running Out of Food in the Last Year: Never true    Ran Out of Food in the Last Year: Never true  Transportation Needs: Not on file  Physical Activity: Not on file  Stress: Not on file  Social Connections: Unknown (05/25/2021)   Received from [facility], [facility]   Social Network  Social Network: Not on file  Intimate Partner Violence: Unknown (04/16/2021)   Received from [facility], [facility]   HITS    Physically Hurt: Not on file    Insult or Talk Down To: Not on file    Threaten Physical Harm: Not on file    Scream or Curse: Not on file    Review of Systems  Constitutional:  Negative for fatigue and unexpected weight change.  Respiratory:  Negative for chest tightness and shortness of breath.   Cardiovascular:  Negative for chest pain, palpitations and leg swelling.  Gastrointestinal:  Negative for abdominal pain and blood in stool.  Neurological:  Negative for dizziness, syncope, light-headedness and headaches.     Objective:   Vitals:   01/13/23 1036  BP: 118/82  Pulse: 83  Temp: 98.1 F (36.7 C)  TempSrc: Temporal  SpO2: 98%  Weight: 183 lb (83 kg)  Height: 5\' 2"  (1.575 m)     Physical Exam Vitals reviewed.  Constitutional:      Appearance: Normal appearance. She is well-developed.  HENT:     Head: Normocephalic and atraumatic.  Eyes:     Conjunctiva/sclera: Conjunctivae normal.     Pupils: Pupils are equal, round, and reactive to light.  Neck:     Vascular: No carotid bruit.  Cardiovascular:     Rate and Rhythm: Normal rate and regular rhythm.     Heart sounds: Normal heart sounds.  Pulmonary:     Effort: Pulmonary effort is normal.     Breath  sounds: Normal breath sounds.  Abdominal:     Palpations: Abdomen is soft. There is no pulsatile mass.     Tenderness: There is no abdominal tenderness.  Musculoskeletal:     Right lower leg: No edema.     Left lower leg: No edema.  Skin:    General: Skin is warm and dry.  Neurological:     Mental Status: She is alert and oriented to person, place, and time.  Psychiatric:        Mood and Affect: Mood normal.        Behavior: Behavior normal.        Assessment & Plan:  Stephanie Nunez is a 62 y.o. female . Type 2 diabetes mellitus with microalbuminuria, without long-term current use of insulin (HCC) - Plan: Comprehensive metabolic panel, Hemoglobin A1c, dapagliflozin propanediol (FARXIGA) 10 MG TABS tablet, sitaGLIPtin (JANUVIA) 100 MG tablet Tingling of both feet - Plan: gabapentin (NEURONTIN) 100 MG capsule Diabetic mononeuropathy associated with type 2 diabetes mellitus (HCC) - Plan: gabapentin (NEURONTIN) 100 MG capsule  -Tolerating current med regimen.  Check A1c.  If still elevated would consider stopping Januvia and starting GLP-1 versus GLP/GIP.  Continue gabapentin for neuropathy symptoms.  Overall stable.  Hyperlipidemia, unspecified hyperlipidemia type - Plan: Lipid panel, niacin (NIASPAN) 750 MG CR tablet, rosuvastatin (CRESTOR) 10 MG tablet  -Unfortunately some confusion Crestor, check labs, restart Crestor 10 mg daily and then repeat testing at her next visit.  Essential hypertension Stable on current regimen, no changes.  Meds ordered this encounter  Medications   gabapentin (NEURONTIN) 100 MG capsule    Sig: TAKE 1 CAPSULE(100 MG) BY MOUTH EVERY EVENING    Dispense:  90 capsule    Refill:  0   dapagliflozin propanediol (FARXIGA) 10 MG TABS tablet    Sig: Take 1 tablet (10 mg total) by mouth daily.    Dispense:  90 tablet    Refill:  1  niacin (NIASPAN) 750 MG CR tablet    Sig: TAKE 2 TABLETS(1500 MG) BY MOUTH AT BEDTIME    Dispense:  180 tablet    Refill:   1   rosuvastatin (CRESTOR) 10 MG tablet    Sig: Take 1 tablet (10 mg total) by mouth daily.    Dispense:  90 tablet    Refill:  1    **Patient requests 90 days supply**   sitaGLIPtin (JANUVIA) 100 MG tablet    Sig: Take 1 tablet (100 mg total) by mouth daily.    Dispense:  90 tablet    Refill:  1   Patient Instructions  Thanks for coming in today.  Restart Crestor once per day, I will check your labs today but suspect the LDL may not be reflective of what Crestor was doing previously.  We can recheck that level next visit. I will check your A1c today and if the level is below 7, no med changes.  If still elevated I would consider changing the Januvia to a medication like Ozempic, Trulicity, or Mounjaro.  We can discuss that further once I see your labs.  No other med changes at this time.  Follow-up in 3 months but let me know if there are any questions in the meantime and take care!    Signed,   Meredith Staggers, MD [facility], [facility] 01/13/23 1:01 PM

## 2023-01-13 NOTE — Patient Instructions (Signed)
Thanks for coming in today.  Restart Crestor once per day, I will check your labs today but suspect the LDL may not be reflective of what Crestor was doing previously.  We can recheck that level next visit. I will check your A1c today and if the level is below 7, no med changes.  If still elevated I would consider changing the Januvia to a medication like Ozempic, Trulicity, or Mounjaro.  We can discuss that further once I see your labs.  No other med changes at this time.  Follow-up in 3 months but let me know if there are any questions in the meantime and take care!

## 2023-01-22 ENCOUNTER — Other Ambulatory Visit: Payer: Self-pay | Admitting: Family Medicine

## 2023-01-22 DIAGNOSIS — R809 Proteinuria, unspecified: Secondary | ICD-10-CM

## 2023-01-23 ENCOUNTER — Telehealth: Payer: Self-pay

## 2023-01-23 NOTE — Telephone Encounter (Signed)
 Electrolytes overall look stable.  Not concerned with the borderline chloride reading.  26-month blood sugar test still a little bit too high at 7.6.  Would like to see that below 7.  LDL cholesterol slightly elevated at 121, would like to see that below 100, and even better if below 70 with history of diabetes.  I would try to restart Crestor  as discussed last visit, let me know if any new side effects with that plan.   I would consider a GLP-1 medication like Ozempic, Trulicity or GIP like Mounjaro in place of the Januvia  for improved diabetes control.  Let me know if that is okay and I can send a prescription to your pharmacy.  Let me know if there are other questions.   Dr. Levora

## 2023-01-24 NOTE — Telephone Encounter (Signed)
 Called patient to discuss lab work, no answer, left a message for the patient to call back to discuss or review by MyChart if that is prefered.

## 2023-01-24 NOTE — Telephone Encounter (Signed)
 Per CRM pt called back, I called patient again but no answer.

## 2023-01-27 ENCOUNTER — Telehealth: Payer: Self-pay

## 2023-01-27 ENCOUNTER — Encounter: Payer: Self-pay | Admitting: Family Medicine

## 2023-01-27 NOTE — Telephone Encounter (Signed)
 Message sent to patient

## 2023-01-27 NOTE — Telephone Encounter (Signed)
 Spoke with patient and discussed this.  See phone note

## 2023-01-27 NOTE — Telephone Encounter (Signed)
 Patient has been called back see other phone note

## 2023-01-27 NOTE — Telephone Encounter (Addendum)
 What does Chloride being low indicate, pt also notes she does not want to start GLP1 she would rather wait and see what happens with  Januvia. Also notes she has consistently taken the rosuvastatin since last visit

## 2023-01-27 NOTE — Telephone Encounter (Signed)
 Letter sent to patient but since she has called me back at least once I will hold telephone message and see about reaching out once more

## 2023-01-27 NOTE — Telephone Encounter (Signed)
 Copied from CRM [id number]. Topic: Clinical - Lab/Test Results >> Jan 27, 2023 10:38 AM Graeme ORN wrote: Reason for CRM: Patient returning missed call from [facility]. States they have been playing phone tag. Called CAL. Jacquline currently in clinic. Advised per note may try to reach back out to patient. Advised Mychart. Patient stated will try to access note in MyChart in the meantime. Advised letter mailed as well. Thank you.

## 2023-01-27 NOTE — Telephone Encounter (Signed)
 Called patient to discuss lab work, no answer, left a message for the patient to call back to discuss or review by MyChart if that is prefered.  Will also be sending a letter.

## 2023-02-09 ENCOUNTER — Other Ambulatory Visit: Payer: Self-pay | Admitting: Family Medicine

## 2023-02-09 DIAGNOSIS — I10 Essential (primary) hypertension: Secondary | ICD-10-CM

## 2023-03-20 ENCOUNTER — Other Ambulatory Visit: Payer: Self-pay | Admitting: Family Medicine

## 2023-03-20 DIAGNOSIS — I10 Essential (primary) hypertension: Secondary | ICD-10-CM

## 2023-04-16 ENCOUNTER — Ambulatory Visit: Payer: BC Managed Care – PPO | Admitting: Family Medicine

## 2023-04-30 ENCOUNTER — Encounter: Payer: Self-pay | Admitting: Family Medicine

## 2023-04-30 ENCOUNTER — Ambulatory Visit: Admitting: Family Medicine

## 2023-04-30 VITALS — BP 120/80 | HR 89 | Wt 177.0 lb

## 2023-04-30 DIAGNOSIS — Z7984 Long term (current) use of oral hypoglycemic drugs: Secondary | ICD-10-CM | POA: Diagnosis not present

## 2023-04-30 DIAGNOSIS — R809 Proteinuria, unspecified: Secondary | ICD-10-CM

## 2023-04-30 DIAGNOSIS — E1129 Type 2 diabetes mellitus with other diabetic kidney complication: Secondary | ICD-10-CM | POA: Diagnosis not present

## 2023-04-30 DIAGNOSIS — Z23 Encounter for immunization: Secondary | ICD-10-CM

## 2023-04-30 DIAGNOSIS — I10 Essential (primary) hypertension: Secondary | ICD-10-CM

## 2023-04-30 DIAGNOSIS — E785 Hyperlipidemia, unspecified: Secondary | ICD-10-CM | POA: Diagnosis not present

## 2023-04-30 LAB — LIPID PANEL
Cholesterol: 157 mg/dL (ref 0–200)
HDL: 54.2 mg/dL (ref >39.00)
LDL Cholesterol: 87 mg/dL (ref 0–99)
NonHDL: 103.02
Total CHOL/HDL Ratio: 3
Triglycerides: 82 mg/dL (ref 0.0–149.0)
VLDL: 16.4 mg/dL (ref 0.0–40.0)

## 2023-04-30 LAB — COMPREHENSIVE METABOLIC PANEL WITH GFR
ALT: 16 U/L (ref 0–35)
AST: 21 U/L (ref 0–37)
Albumin: 4.8 g/dL (ref 3.5–5.2)
Alkaline Phosphatase: 65 U/L (ref 39–117)
BUN: 8 mg/dL (ref 6–23)
CO2: 34 meq/L — ABNORMAL HIGH (ref 19–32)
Calcium: 9.3 mg/dL (ref 8.4–10.5)
Chloride: 94 meq/L — ABNORMAL LOW (ref 96–112)
Creatinine, Ser: 0.66 mg/dL (ref 0.40–1.20)
GFR: 94.25 mL/min (ref >60.00)
Glucose, Bld: 87 mg/dL (ref 70–99)
Potassium: 3.3 meq/L — ABNORMAL LOW (ref 3.5–5.1)
Sodium: 137 meq/L (ref 135–145)
Total Bilirubin: 0.5 mg/dL (ref 0.2–1.2)
Total Protein: 7.8 g/dL (ref 6.0–8.3)

## 2023-04-30 LAB — MICROALBUMIN / CREATININE URINE RATIO
Creatinine,U: 60.4 mg/dL
Microalb Creat Ratio: UNDETERMINED mg/g (ref 0.0–30.0)
Microalb, Ur: <0.7 mg/dL

## 2023-04-30 LAB — HEMOGLOBIN A1C: Hgb A1c MFr Bld: 6.7 % — ABNORMAL HIGH (ref 4.6–6.5)

## 2023-04-30 MED ORDER — SITAGLIPTIN PHOSPHATE 100 MG PO TABS: 100.0000 mg | ORAL_TABLET | Freq: Every day | ORAL | 1 refills | Status: DC

## 2023-04-30 MED ORDER — METFORMIN HCL 1000 MG PO TABS
ORAL_TABLET | ORAL | 2 refills | Status: DC
Start: 2023-04-30 — End: 2023-12-10

## 2023-04-30 MED ORDER — DAPAGLIFLOZIN PROPANEDIOL 10 MG PO TABS: 10.0000 mg | ORAL_TABLET | Freq: Every day | ORAL | 1 refills | Status: DC

## 2023-04-30 MED ORDER — ROSUVASTATIN CALCIUM 10 MG PO TABS: 10.0000 mg | ORAL_TABLET | Freq: Every day | ORAL | 1 refills | Status: DC

## 2023-04-30 MED ORDER — HYDROCHLOROTHIAZIDE 25 MG PO TABS: ORAL_TABLET | ORAL | 2 refills | Status: DC

## 2023-04-30 MED ORDER — NIACIN ER (ANTIHYPERLIPIDEMIC) 750 MG PO TBCR: EXTENDED_RELEASE_TABLET | ORAL | 1 refills | Status: DC

## 2023-04-30 NOTE — Progress Notes (Signed)
 Subjective:  Patient ID: Stephanie Nunez, female    DOB: [birth year]  Age: 63 y.o. MRN: [id number]  CC:  Chief Complaint  Patient presents with   Medical Management of Chronic Issues    Patient states no concerns and only ate breakfast.       HPI Stephanie Nunez presents for   Diabetes: With microalbuminuria.  Treated with Marcelline Deist, metformin, Januvia started in September, Crestor ordered in September. Denies signs or symptoms of mycotic or urinary tract infections.  Some symptoms of vaginal atrophy but no infection symptoms and has been discussed with her GYN.  No history of DKA. Home readings fasting - not checking Postprandial: not checking.  Symptomatic lows, no symptoms.  Has taken gabapentin as needed at times at bedtime   - not needed recently.   She is on ACE inhibitor with lisinopril.  Microalbumin: nl 04/08/22 - ratio 42 when checked 5 yrs ago.  Optho, foot exam, pneumovax:  Optho - due for repeat  - plans to schedule.  Pap with GYN - due this year - will schedule. Prevnar today  Immunization History  Administered Date(s) Administered   Influenza Inj Mdck Quad Pf 10/13/2018   Influenza Split 10/31/2011   Influenza,inj,Quad PF,6+ Mos 10/18/2012, 09/09/2015, 09/21/2016, 10/07/2017, 10/04/2019   Influenza-Unspecified 11/05/2013, 10/07/2017, 09/29/2020   Moderna Sars-Covid-2 Vaccination 02/26/2019, 03/27/2019, 11/13/2019, 05/19/2020, 09/29/2020   Pfizer Covid-19 Vaccine Bivalent Booster 32yrs & up 11/15/2021, 09/21/2022   Pneumococcal Polysaccharide-23 09/09/2015   Tdap 11/13/2004, 09/09/2015   Zoster Recombinant(Shingrix) 10/13/2018, 01/01/2019   Zoster, Live 05/14/2012    Lab Results  Component Value Date   HGBA1C 7.6 (H) 01/13/2023   HGBA1C 7.8 (H) 09/06/2022   HGBA1C 7.7 (H) 04/08/2022   Lab Results  Component Value Date   MICROALBUR <0.7 04/08/2022   LDLCALC 121 (H) 01/13/2023   CREATININE 0.66 01/13/2023    Hyperlipidemia: Previously on lovastatin,  change to Crestor for improved treatment.  LDL 121 in December.  She had taken Crestor for 1 month then back on lovastatin due to misunderstanding the plan.  Did not have any side effects with Crestor and niacin.  Had tolerated meds with co-Q10 supplement. Taking crestor daily. Not fasting. No new side effects or myalgias.  Lab Results  Component Value Date   CHOL 197 01/13/2023   HDL 61.60 01/13/2023   LDLCALC 121 (H) 01/13/2023   TRIG 71.0 01/13/2023   CHOLHDL 3 01/13/2023   Lab Results  Component Value Date   ALT 17 01/13/2023   AST 21 01/13/2023   ALKPHOS 50 01/13/2023   BILITOT 0.9 01/13/2023    Hypertension: Lisinopril 10mg  every day, hydrochlorothiazide 25mg  every day.  On otc meds for allergies including flonase. No fever.  Home readings: none.  BP Readings from Last 3 Encounters:  04/30/23 120/80  01/13/23 118/82  09/06/22 110/64   Lab Results  Component Value Date   CREATININE 0.66 01/13/2023    History Patient Active Problem List   Diagnosis Date Noted   Epiphora 01/05/2016   HTN (hypertension) 02/01/2011   Hyperlipemia 02/01/2011   Obesity (BMI 30-39.9) 02/01/2011   DM type 2 (diabetes mellitus, type 2) (HCC) 02/01/2011   Past Medical History:  Diagnosis Date   Allergic rhinitis    Cervical polyp    History of gestational diabetes mellitus    Hyperlipidemia    no meds   Hypertension    Type 2 diabetes mellitus (HCC)    followed by pcp---  (11-08-2020  pt stated check  blood sugar once weekly, fasting sugar -- 145)   Uterine fibroid    Wears glasses    Past Surgical History:  Procedure Laterality Date   COLONOSCOPY  12/06/2015   by stark   DILATATION & CURETTAGE/HYSTEROSCOPY WITH MYOSURE N/A 11/10/2020   Procedure: DILATATION & CURETTAGE/HYSTEROSCOPY WITH  MYOSURE;  Surgeon: Harold Hedge, MD;  Location: [facility];  Service: Gynecology;  Laterality: N/A;   LIPOMA EXCISION  1990   Left chest wall   LUMBAR SPINE SURGERY  1992    L4-5   Allergies  Allergen Reactions   Simvastatin     myalgia   Statins     Severe musculoskeletal pains.   Prior to Admission medications   Medication Sig Start Date End Date Taking? Authorizing Provider  aspirin 81 MG tablet Take 81 mg by mouth at bedtime.   Yes [provider]  blood glucose meter kit and supplies Dispense based on insurance preference. once daily as directed. E11.9 02/03/19  Yes Shade Flood, MD  cholecalciferol (VITAMIN D) 1000 units tablet Take 1,000 Units by mouth daily.   Yes [provider]  dapagliflozin propanediol (FARXIGA) 10 MG TABS tablet Take 1 tablet (10 mg total) by mouth daily. 01/13/23  Yes Shade Flood, MD  fluconazole (DIFLUCAN) 150 MG tablet Take 1 tablet (150 mg total) by mouth daily. 09/06/22  Yes Shade Flood, MD  fluticasone (FLONASE) 50 MCG/ACT nasal spray SHAKE LIQUID AND USE 1 TO 2 SPRAYS IN EACH NOSTRIL DAILY 05/08/21  Yes Shade Flood, MD  gabapentin (NEURONTIN) 100 MG capsule TAKE 1 CAPSULE(100 MG) BY MOUTH EVERY EVENING 01/13/23  Yes Shade Flood, MD  hydrochlorothiazide (HYDRODIURIL) 25 MG tablet TAKE 1 TABLET(25 MG) BY MOUTH DAILY 09/06/22  Yes Shade Flood, MD  lisinopril (ZESTRIL) 10 MG tablet TAKE 1 TABLET(10 MG) BY MOUTH DAILY 03/21/23  Yes Shade Flood, MD  metFORMIN (GLUCOPHAGE) 1000 MG tablet TAKE 1 TABLET(1000 MG) BY MOUTH TWICE DAILY WITH A MEAL 01/22/23  Yes Shade Flood, MD  Multiple Vitamin (MULTIVITAMIN) tablet Take 1 tablet by mouth daily.   Yes [provider]  niacin (NIASPAN) 750 MG CR tablet TAKE 2 TABLETS(1500 MG) BY MOUTH AT BEDTIME 01/13/23  Yes Shade Flood, MD  [facility] ULTRA test strip USE TO TEST AS DIRECTED ONCE DAILY 08/03/19  Yes Shade Flood, MD  rosuvastatin (CRESTOR) 10 MG tablet Take 1 tablet (10 mg total) by mouth daily. 01/13/23  Yes Shade Flood, MD  sitaGLIPtin (JANUVIA) 100 MG tablet Take 1 tablet (100 mg total) by mouth daily.  01/13/23  Yes Shade Flood, MD   Social History   Socioeconomic History   Marital status: Married    Spouse name: Thayer Ohm   Number of children: 1   Years of education: 16   Highest education level: Bachelor's degree (e.g., BA, AB, BS)  Occupational History   Occupation: Geologist, engineering  Tobacco Use   Smoking status: Never   Smokeless tobacco: Never  Substance and Sexual Activity   Alcohol use: Not Currently    Comment: seldom   Drug use: Never   Sexual activity: Yes    Partners: Male    Birth control/protection: Condom  Other Topics Concern   Not on file  Social History Narrative   Lives with her husband (a Teacher, early years/pre) and their daughter.  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, environmental   Social Drivers of Health  Financial Resource Strain: Low Risk  (04/29/2023)   Overall Financial Resource Strain (CARDIA)    Difficulty of Paying Living Expenses: Not hard at all  Food Insecurity: No Food Insecurity (04/29/2023)   Hunger Vital Sign    Worried About Running Out of Food in the Last Year: Never true    Ran Out of Food in the Last Year: Never true  Transportation Needs: No Transportation Needs (04/29/2023)   PRAPARE - Administrator, Civil Service (Medical): No    Lack of Transportation (Non-Medical): No  Physical Activity: Unknown (04/29/2023)   Exercise Vital Sign    Days of Exercise per Week: 0 days    Minutes of Exercise per Session: Not on file  Stress: No Stress Concern Present (04/29/2023)   [employer] of Occupational Health - Occupational Stress Questionnaire    Feeling of Stress : Not at all  Social Connections: Moderately Isolated (04/29/2023)   Social Connection and Isolation Panel [NHANES]    Frequency of Communication with Friends and Family: Twice a week    Frequency of Social Gatherings with Friends and Family: Once a week    Attends Religious Services: Never    Database administrator or Organizations: No     Attends Engineer, structural: Not on file    Marital Status: Married  Intimate Partner Violence: Unknown (04/16/2021)   Received from [employer], [facility]   HITS    Physically Hurt: Not on file    Insult or Talk Down To: Not on file    Threaten Physical Harm: Not on file    Scream or Curse: Not on file    Review of Systems  Constitutional:  Negative for fatigue and unexpected weight change.  Respiratory:  Negative for chest tightness and shortness of breath.   Cardiovascular:  Negative for chest pain, palpitations and leg swelling.  Gastrointestinal:  Negative for abdominal pain and blood in stool.  Neurological:  Negative for dizziness, syncope, light-headedness and headaches.     Objective:   Vitals:   04/30/23 1255  BP: 120/80  Pulse: 89  SpO2: 97%  Weight: 177 lb (80.3 kg)     Physical Exam Vitals reviewed.  Constitutional:      Appearance: Normal appearance. She is well-developed.  HENT:     Head: Normocephalic and atraumatic.  Eyes:     Conjunctiva/sclera: Conjunctivae normal.     Pupils: Pupils are equal, round, and reactive to light.  Neck:     Vascular: No carotid bruit.  Cardiovascular:     Rate and Rhythm: Normal rate and regular rhythm.     Heart sounds: Normal heart sounds.  Pulmonary:     Effort: Pulmonary effort is normal.     Breath sounds: Normal breath sounds.  Abdominal:     Palpations: Abdomen is soft. There is no pulsatile mass.     Tenderness: There is no abdominal tenderness.  Musculoskeletal:     Right lower leg: No edema.     Left lower leg: No edema.  Skin:    General: Skin is warm and dry.  Neurological:     Mental Status: She is alert and oriented to person, place, and time.  Psychiatric:        Mood and Affect: Mood normal.        Behavior: Behavior normal.        Assessment & Plan:  Stephanie Nunez is a 63 y.o. female . Type 2 diabetes mellitus with microalbuminuria,  without long-term current use of  insulin (HCC) - Plan: Comprehensive metabolic panel with GFR, Microalbumin / creatinine urine ratio, Hemoglobin A1c, dapagliflozin propanediol (FARXIGA) 10 MG TABS tablet, metFORMIN (GLUCOPHAGE) 1000 MG tablet, sitaGLIPtin (JANUVIA) 100 MG tablet  Need for pneumococcal vaccination - Plan: Pneumococcal conjugate vaccine 20-valent (Prevnar 20)  Hyperlipidemia, unspecified hyperlipidemia type - Plan: Comprehensive metabolic panel with GFR, Lipid panel, niacin (NIASPAN) 750 MG CR tablet, rosuvastatin (CRESTOR) 10 MG tablet  Essential hypertension - Plan: hydrochlorothiazide (HYDRODIURIL) 25 MG tablet  Tolerating current med regimen for diabetes, hyperlipidemia and hypertension, stable readings in office.  No known home blood sugar readings, will check A1c, would like to see that closer to 7.  Medication adjustments can be discussed after lab review.  Updated pneumonia vaccine given, check labs as above, recheck 3 months.   Meds ordered this encounter  Medications   dapagliflozin propanediol (FARXIGA) 10 MG TABS tablet    Sig: Take 1 tablet (10 mg total) by mouth daily.    Dispense:  90 tablet    Refill:  1   hydrochlorothiazide (HYDRODIURIL) 25 MG tablet    Sig: TAKE 1 TABLET(25 MG) BY MOUTH DAILY    Dispense:  90 tablet    Refill:  2   metFORMIN (GLUCOPHAGE) 1000 MG tablet    Sig: TAKE 1 TABLET(1000 MG) BY MOUTH TWICE DAILY WITH A MEAL    Dispense:  180 tablet    Refill:  2   niacin (NIASPAN) 750 MG CR tablet    Sig: TAKE 2 TABLETS(1500 MG) BY MOUTH AT BEDTIME    Dispense:  180 tablet    Refill:  1   rosuvastatin (CRESTOR) 10 MG tablet    Sig: Take 1 tablet (10 mg total) by mouth daily.    Dispense:  90 tablet    Refill:  1    **Patient requests 90 days supply**   sitaGLIPtin (JANUVIA) 100 MG tablet    Sig: Take 1 tablet (100 mg total) by mouth daily.    Dispense:  90 tablet    Refill:  1   Patient Instructions  Thanks for coming today.  No med changes at this time.  I would like  to see the A1c a little bit lower but we will see what the labs look like and go from there.  Take care!    Signed,   Caro Christmas, MD [facility], [facility] 04/30/23 1:35 PM

## 2023-04-30 NOTE — Patient Instructions (Signed)
 Thanks for coming today.  No med changes at this time.  I would like to see the A1c a little bit lower but we will see what the labs look like and go from there.  Take care!

## 2023-05-01 ENCOUNTER — Other Ambulatory Visit: Payer: Self-pay | Admitting: Family Medicine

## 2023-05-01 ENCOUNTER — Encounter: Payer: Self-pay | Admitting: Family Medicine

## 2023-05-01 DIAGNOSIS — E876 Hypokalemia: Secondary | ICD-10-CM

## 2023-05-01 LAB — HM DIABETES EYE EXAM

## 2023-05-01 MED ORDER — POTASSIUM CHLORIDE CRYS ER 10 MEQ PO TBCR: 20.0000 meq | EXTENDED_RELEASE_TABLET | Freq: Every day | ORAL | 2 refills | Status: DC

## 2023-05-01 NOTE — Telephone Encounter (Signed)
-----   Message from Benjiman Bras sent at 05/01/2023 11:15 AM EDT ----- Results sent by MyChart Please schedule lab visit for BMP, hypokalemia in the next week if possible.

## 2023-05-01 NOTE — Progress Notes (Signed)
 See lab notes

## 2023-05-01 NOTE — Telephone Encounter (Signed)
 Lab results have been discussed.   Verbalized understanding? Yes  Are there any questions? No   Patient requested to complete labs at [facility] location, Future lab orders have been placed.

## 2023-05-01 NOTE — Telephone Encounter (Deleted)
-----   Message from Benjiman Bras sent at 05/01/2023 11:15 AM EDT ----- Results sent by MyChart Please schedule lab visit for BMP, hypokalemia in the next week if possible.

## 2023-05-06 NOTE — Progress Notes (Signed)
 Per note Stephanie Nunez has spoke with pt.

## 2023-05-09 ENCOUNTER — Other Ambulatory Visit

## 2023-05-09 DIAGNOSIS — E876 Hypokalemia: Secondary | ICD-10-CM

## 2023-05-09 LAB — BASIC METABOLIC PANEL WITH GFR
BUN: 15 mg/dL (ref 6–23)
CO2: 33 meq/L — ABNORMAL HIGH (ref 19–32)
Calcium: 9.1 mg/dL (ref 8.4–10.5)
Chloride: 97 meq/L (ref 96–112)
Creatinine, Ser: 0.65 mg/dL (ref 0.40–1.20)
GFR: 94.58 mL/min (ref >60.00)
Glucose, Bld: 94 mg/dL (ref 70–99)
Potassium: 3.5 meq/L (ref 3.5–5.1)
Sodium: 140 meq/L (ref 135–145)

## 2023-05-13 ENCOUNTER — Encounter: Payer: Self-pay | Admitting: Family Medicine

## 2023-05-19 ENCOUNTER — Telehealth: Payer: Self-pay

## 2023-05-19 NOTE — Telephone Encounter (Signed)
-----   Message from Benjiman Bras sent at 05/19/2023 12:57 PM EDT ----- Results sent by MyChart, but appears patient has not yet reviewed those results.  Please call and make sure they have either seen note or discuss result note.  Thanks.

## 2023-06-16 ENCOUNTER — Other Ambulatory Visit: Payer: Self-pay | Admitting: Family Medicine

## 2023-06-16 DIAGNOSIS — I10 Essential (primary) hypertension: Secondary | ICD-10-CM

## 2023-06-17 ENCOUNTER — Telehealth: Payer: Self-pay

## 2023-06-17 NOTE — Telephone Encounter (Signed)
 Copied from CRM [id number]. Topic: Clinical - Prescription Issue >> Jun 17, 2023  4:03 PM Jim Motts C wrote: Reason for CRM: Patient called in and stated that [facility] refused the refill for her script for lisinopril  (ZESTRIL ) 10 MG tablet and they didn't provide any additional reasons to the patient.   [id number] - [place], [place] - [address] AT [facility] OF [address] [place] [address] Phone: [phone] Fax: [phone]   Patient contact is [phone].

## 2023-06-18 ENCOUNTER — Other Ambulatory Visit: Payer: Self-pay

## 2023-06-18 ENCOUNTER — Other Ambulatory Visit: Payer: Self-pay | Admitting: Family Medicine

## 2023-06-18 DIAGNOSIS — I10 Essential (primary) hypertension: Secondary | ICD-10-CM

## 2023-06-18 MED ORDER — LISINOPRIL 10 MG PO TABS: ORAL_TABLET | ORAL | 2 refills | Status: DC

## 2023-06-18 NOTE — Telephone Encounter (Signed)
 Spoke with pharmacy and they needed a new script sent in and unsure why. I resent this and they confirmed they received this. Left vm for pt to call office to let her know the pharmacy is filling this now.

## 2023-07-30 ENCOUNTER — Encounter: Payer: Self-pay | Admitting: Family Medicine

## 2023-07-30 ENCOUNTER — Ambulatory Visit: Payer: Self-pay | Admitting: Family Medicine

## 2023-07-30 ENCOUNTER — Ambulatory Visit: Admitting: Family Medicine

## 2023-07-30 VITALS — BP 118/76 | HR 72 | Temp 97.9°F | Ht 62.0 in | Wt 176.0 lb

## 2023-07-30 DIAGNOSIS — E1129 Type 2 diabetes mellitus with other diabetic kidney complication: Secondary | ICD-10-CM | POA: Diagnosis not present

## 2023-07-30 DIAGNOSIS — R809 Proteinuria, unspecified: Secondary | ICD-10-CM

## 2023-07-30 DIAGNOSIS — Z7984 Long term (current) use of oral hypoglycemic drugs: Secondary | ICD-10-CM | POA: Diagnosis not present

## 2023-07-30 LAB — COMPREHENSIVE METABOLIC PANEL WITH GFR
ALT: 13 U/L (ref 0–35)
AST: 22 U/L (ref 0–37)
Albumin: 4.6 g/dL (ref 3.5–5.2)
Alkaline Phosphatase: 52 U/L (ref 39–117)
BUN: 13 mg/dL (ref 6–23)
CO2: 33 meq/L — ABNORMAL HIGH (ref 19–32)
Calcium: 9.5 mg/dL (ref 8.4–10.5)
Chloride: 95 meq/L — ABNORMAL LOW (ref 96–112)
Creatinine, Ser: 0.64 mg/dL (ref 0.40–1.20)
GFR: 94.79 mL/min (ref >60.00)
Glucose, Bld: 85 mg/dL (ref 70–99)
Potassium: 3.8 meq/L (ref 3.5–5.1)
Sodium: 137 meq/L (ref 135–145)
Total Bilirubin: 0.8 mg/dL (ref 0.2–1.2)
Total Protein: 7.6 g/dL (ref 6.0–8.3)

## 2023-07-30 LAB — HEMOGLOBIN A1C: Hgb A1c MFr Bld: 6.6 % — ABNORMAL HIGH (ref 4.6–6.5)

## 2023-07-30 NOTE — Patient Instructions (Signed)
 Diabetes test looked much better in April, I am anticipating a good reading again today.  If any concerns on labs I will let you know but no med changes for now.  Follow-up in 3 months, and if stable at that time we can space out office visits to every 6 months for diabetes and other med checks.  Let me know if any refills needed prior to next visit.  Take care!

## 2023-07-30 NOTE — Progress Notes (Signed)
 Subjective:  Patient ID: Stephanie Nunez, female    DOB: 09-Feb-1960  Age: 63 y.o. MRN: [id number]  CC:  Chief Complaint  Patient presents with   Follow-up    23mo; doing alright, no complaints    HPI Stephanie Nunez presents for   Diabetes: With microalbuminuria treated with Farxiga , metformin  and Januvia .  On statin with Crestor .  On ACE inhibitor with lisinopril .  Intermittent need for gabapentin  previously.not needing.  Denies symptoms of mycotic or urinary tract infections, no history of DKA.  Significantly improved control in April. Home readings: none.  No symptoms of symptomatic lows. Slight HA if not eating in am - no other sx's. No home readings.  Microalbumin: Normal ratio 04/30/2023. Optho, foot exam, pneumovax: Up-to-date  Lab Results  Component Value Date   HGBA1C 6.7 (H) 04/30/2023   HGBA1C 7.6 (H) 01/13/2023   HGBA1C 7.8 (H) 09/06/2022   Lab Results  Component Value Date   MICROALBUR <0.7 04/30/2023   LDLCALC 87 04/30/2023   CREATININE 0.65 05/09/2023   Lab Results  Component Value Date   NA 140 05/09/2023   K 3.5 05/09/2023   CL 97 05/09/2023   CO2 33 (H) 05/09/2023     History Patient Active Problem List   Diagnosis Date Noted   Epiphora 01/05/2016   HTN (hypertension) 02/01/2011   Hyperlipemia 02/01/2011   Obesity (BMI 30-39.9) 02/01/2011   DM type 2 (diabetes mellitus, type 2) (HCC) 02/01/2011   Past Medical History:  Diagnosis Date   Allergic rhinitis    Cervical polyp    History of gestational diabetes mellitus    Hyperlipidemia    no meds   Hypertension    Type 2 diabetes mellitus (HCC)    followed by pcp---  (11-08-2020  pt stated check blood sugar once weekly, fasting sugar -- 145)   Uterine fibroid    Wears glasses    Past Surgical History:  Procedure Laterality Date   COLONOSCOPY  12/06/2015   by stark   DILATATION & CURETTAGE/HYSTEROSCOPY WITH MYOSURE N/A 11/10/2020   Procedure: DILATATION & CURETTAGE/HYSTEROSCOPY WITH   MYOSURE;  Surgeon: Curlene Agent, MD;  Location: [facility];  Service: Gynecology;  Laterality: N/A;   LIPOMA EXCISION  1990   Left chest wall   LUMBAR SPINE SURGERY  1992   L4-5   Allergies  Allergen Reactions   Simvastatin     myalgia   Statins     Severe musculoskeletal pains.   Prior to Admission medications   Medication Sig Start Date End Date Taking? Authorizing Provider  aspirin 81 MG tablet Take 81 mg by mouth at bedtime.   Yes [provider]  blood glucose meter kit and supplies Dispense based on insurance preference. once daily as directed. E11.9 02/03/19  Yes Levora Reyes SAUNDERS, MD  cholecalciferol (VITAMIN D) 1000 units tablet Take 1,000 Units by mouth daily.   Yes [provider]  dapagliflozin  propanediol (FARXIGA ) 10 MG TABS tablet Take 1 tablet (10 mg total) by mouth daily. 04/30/23  Yes Levora Reyes SAUNDERS, MD  fluconazole  (DIFLUCAN ) 150 MG tablet Take 1 tablet (150 mg total) by mouth daily. 09/06/22  Yes Levora Reyes SAUNDERS, MD  fluticasone  (FLONASE ) 50 MCG/ACT nasal spray SHAKE LIQUID AND USE 1 TO 2 SPRAYS IN EACH NOSTRIL DAILY 05/08/21  Yes Levora Reyes SAUNDERS, MD  gabapentin  (NEURONTIN ) 100 MG capsule TAKE 1 CAPSULE(100 MG) BY MOUTH EVERY EVENING 01/13/23  Yes Levora Reyes SAUNDERS, MD  hydrochlorothiazide  (HYDRODIURIL ) 25  MG tablet TAKE 1 TABLET(25 MG) BY MOUTH DAILY 04/30/23  Yes Levora Reyes SAUNDERS, MD  lisinopril  (ZESTRIL ) 10 MG tablet TAKE 1 TABLET(10 MG) BY MOUTH DAILY 06/18/23  Yes Levora Reyes SAUNDERS, MD  metFORMIN  (GLUCOPHAGE ) 1000 MG tablet TAKE 1 TABLET(1000 MG) BY MOUTH TWICE DAILY WITH A MEAL 04/30/23  Yes Levora Reyes SAUNDERS, MD  Multiple Vitamin (MULTIVITAMIN) tablet Take 1 tablet by mouth daily.   Yes [provider]  niacin  (NIASPAN ) 750 MG CR tablet TAKE 2 TABLETS(1500 MG) BY MOUTH AT BEDTIME 04/30/23  Yes Levora Reyes SAUNDERS, MD  [facility] ULTRA test strip USE TO TEST AS DIRECTED ONCE DAILY 08/03/19  Yes Levora Reyes SAUNDERS, MD   potassium chloride  (KLOR-CON  M) 10 MEQ tablet Take 2 tablets (20 mEq total) by mouth daily. 05/01/23  Yes Levora Reyes SAUNDERS, MD  rosuvastatin  (CRESTOR ) 10 MG tablet Take 1 tablet (10 mg total) by mouth daily. 04/30/23  Yes Levora Reyes SAUNDERS, MD  sitaGLIPtin  (JANUVIA ) 100 MG tablet Take 1 tablet (100 mg total) by mouth daily. 04/30/23  Yes Levora Reyes SAUNDERS, MD   Social History   Socioeconomic History   Marital status: Married    Spouse name: Medford   Number of children: 1   Years of education: 16   Highest education level: Bachelor's degree (e.g., BA, AB, BS)  Occupational History   Occupation: Geologist, engineering  Tobacco Use   Smoking status: Never   Smokeless tobacco: Never  Substance and Sexual Activity   Alcohol use: Not Currently    Comment: seldom   Drug use: Never   Sexual activity: Yes    Partners: Male    Birth control/protection: Condom  Other Topics Concern   Not on file  Social History Narrative   Lives with her husband (a Teacher, early years/pre) and their daughter.  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, environmental   Social Drivers of Health   Financial Resource Strain: Low Risk  (04/29/2023)   Overall Financial Resource Strain (CARDIA)    Difficulty of Paying Living Expenses: Not hard at all  Food Insecurity: No Food Insecurity (04/29/2023)   Hunger Vital Sign    Worried About Running Out of Food in the Last Year: Never true    Ran Out of Food in the Last Year: Never true  Transportation Needs: No Transportation Needs (04/29/2023)   PRAPARE - Administrator, Civil Service (Medical): No    Lack of Transportation (Non-Medical): No  Physical Activity: Unknown (04/29/2023)   Exercise Vital Sign    Days of Exercise per Week: 0 days    Minutes of Exercise per Session: Not on file  Stress: No Stress Concern Present (04/29/2023)   [employer] of Occupational Health - Occupational Stress Questionnaire    Feeling of Stress : Not at all  Social  Connections: Moderately Isolated (04/29/2023)   Social Connection and Isolation Panel    Frequency of Communication with Friends and Family: Twice a week    Frequency of Social Gatherings with Friends and Family: Once a week    Attends Religious Services: Never    Database administrator or Organizations: No    Attends Engineer, structural: Not on file    Marital Status: Married  Intimate Partner Violence: Unknown (04/16/2021)   Received from [facility]   HITS    Physically Hurt: Not on file    Insult or Talk Down To: Not on file    Threaten Physical Harm:  Not on file    Scream or Curse: Not on file    Review of Systems  Constitutional:  Negative for fatigue and unexpected weight change.  Respiratory:  Negative for chest tightness and shortness of breath.   Cardiovascular:  Negative for chest pain, palpitations and leg swelling.  Gastrointestinal:  Negative for abdominal pain and blood in stool.  Neurological:  Negative for dizziness, syncope, light-headedness and headaches.     Objective:   Vitals:   07/30/23 0853  BP: 118/76  Pulse: 72  Temp: 97.9 F (36.6 C)  SpO2: 96%  Weight: 176 lb (79.8 kg)  Height: 5' 2 (1.575 m)     Physical Exam Vitals reviewed.  Constitutional:      Appearance: Normal appearance. She is well-developed.  HENT:     Head: Normocephalic and atraumatic.  Eyes:     Conjunctiva/sclera: Conjunctivae normal.     Pupils: Pupils are equal, round, and reactive to light.  Neck:     Vascular: No carotid bruit.  Cardiovascular:     Rate and Rhythm: Normal rate and regular rhythm.     Heart sounds: Normal heart sounds.  Pulmonary:     Effort: Pulmonary effort is normal.     Breath sounds: Normal breath sounds.  Abdominal:     Palpations: Abdomen is soft. There is no pulsatile mass.     Tenderness: There is no abdominal tenderness.  Musculoskeletal:     Right lower leg: No edema.     Left lower leg: No edema.  Skin:    General: Skin  is warm and dry.  Neurological:     Mental Status: She is alert and oriented to person, place, and time.  Psychiatric:        Mood and Affect: Mood normal.        Behavior: Behavior normal.        Assessment & Plan:  Stephanie Nunez is a 63 y.o. female . Type 2 diabetes mellitus with microalbuminuria, without long-term current use of insulin (HCC) - Plan: Comprehensive metabolic panel with GFR, Hemoglobin A1c Tolerating current med regimen, improvement in A1c in April, will recheck levels today.  3 months follow-up, then if stable at that time can change to 43-month intervals.  Fasting labs for next visit.  CMP and A1c today.  Should have refills until next visit but okay to refill meds if needed  No orders of the defined types were placed in this encounter.  Patient Instructions  Diabetes test looked much better in April, I am anticipating a good reading again today.  If any concerns on labs I will let you know but no med changes for now.  Follow-up in 3 months, and if stable at that time we can space out office visits to every 6 months for diabetes and other med checks.  Let me know if any refills needed prior to next visit.  Take care!    Signed,   Reyes Pines, MD [facility], [facility] 07/30/23 9:16 AM

## 2023-08-05 NOTE — Progress Notes (Signed)
 Called patient and went over lab results. Patient verbalized understanding and didn't have any questions or concerns at this time

## 2023-08-08 ENCOUNTER — Other Ambulatory Visit: Payer: Self-pay | Admitting: Family Medicine

## 2023-08-08 DIAGNOSIS — E876 Hypokalemia: Secondary | ICD-10-CM

## 2023-08-12 LAB — HM MAMMOGRAPHY

## 2023-10-30 ENCOUNTER — Ambulatory Visit: Admitting: Family Medicine

## 2023-11-13 ENCOUNTER — Other Ambulatory Visit: Payer: Self-pay | Admitting: Family Medicine

## 2023-11-13 DIAGNOSIS — E876 Hypokalemia: Secondary | ICD-10-CM

## 2023-12-10 ENCOUNTER — Ambulatory Visit: Admitting: Family Medicine

## 2023-12-10 ENCOUNTER — Encounter: Payer: Self-pay | Admitting: Family Medicine

## 2023-12-10 VITALS — BP 108/82 | HR 89 | Temp 98.6°F | Resp 20 | Ht 62.0 in | Wt 177.0 lb

## 2023-12-10 DIAGNOSIS — Z7984 Long term (current) use of oral hypoglycemic drugs: Secondary | ICD-10-CM

## 2023-12-10 DIAGNOSIS — E876 Hypokalemia: Secondary | ICD-10-CM

## 2023-12-10 DIAGNOSIS — I10 Essential (primary) hypertension: Secondary | ICD-10-CM | POA: Diagnosis not present

## 2023-12-10 DIAGNOSIS — E1129 Type 2 diabetes mellitus with other diabetic kidney complication: Secondary | ICD-10-CM | POA: Diagnosis not present

## 2023-12-10 DIAGNOSIS — E785 Hyperlipidemia, unspecified: Secondary | ICD-10-CM

## 2023-12-10 DIAGNOSIS — J019 Acute sinusitis, unspecified: Secondary | ICD-10-CM | POA: Diagnosis not present

## 2023-12-10 DIAGNOSIS — R809 Proteinuria, unspecified: Secondary | ICD-10-CM | POA: Diagnosis not present

## 2023-12-10 LAB — HEMOGLOBIN A1C: Hgb A1c MFr Bld: 6.4 % (ref 4.6–6.5)

## 2023-12-10 LAB — COMPREHENSIVE METABOLIC PANEL WITH GFR
ALT: 20 U/L (ref 0–35)
AST: 24 U/L (ref 0–37)
Albumin: 4.8 g/dL (ref 3.5–5.2)
Alkaline Phosphatase: 51 U/L (ref 39–117)
BUN: 9 mg/dL (ref 6–23)
CO2: 34 meq/L — ABNORMAL HIGH (ref 19–32)
Calcium: 9.6 mg/dL (ref 8.4–10.5)
Chloride: 95 meq/L — ABNORMAL LOW (ref 96–112)
Creatinine, Ser: 0.6 mg/dL (ref 0.40–1.20)
GFR: 96.03 mL/min (ref >60.00)
Glucose, Bld: 102 mg/dL — ABNORMAL HIGH (ref 70–99)
Potassium: 3.4 meq/L — ABNORMAL LOW (ref 3.5–5.1)
Sodium: 138 meq/L (ref 135–145)
Total Bilirubin: 0.9 mg/dL (ref 0.2–1.2)
Total Protein: 7.9 g/dL (ref 6.0–8.3)

## 2023-12-10 LAB — LIPID PANEL
Cholesterol: 156 mg/dL (ref 0–200)
HDL: 60.9 mg/dL (ref >39.00)
LDL Cholesterol: 85 mg/dL (ref 0–99)
NonHDL: 95.02
Total CHOL/HDL Ratio: 3
Triglycerides: 48 mg/dL (ref 0.0–149.0)
VLDL: 9.6 mg/dL (ref 0.0–40.0)

## 2023-12-10 MED ORDER — ROSUVASTATIN CALCIUM 10 MG PO TABS: 10.0000 mg | ORAL_TABLET | Freq: Every day | ORAL | 1 refills | Status: AC

## 2023-12-10 MED ORDER — AMOXICILLIN-POT CLAVULANATE 875-125 MG PO TABS: 1.0000 | ORAL_TABLET | Freq: Two times a day (BID) | ORAL | 0 refills | Status: AC

## 2023-12-10 MED ORDER — NIACIN ER (ANTIHYPERLIPIDEMIC) 750 MG PO TBCR: EXTENDED_RELEASE_TABLET | ORAL | 1 refills | Status: AC

## 2023-12-10 MED ORDER — SITAGLIPTIN PHOSPHATE 100 MG PO TABS: 100.0000 mg | ORAL_TABLET | Freq: Every day | ORAL | 1 refills | Status: AC

## 2023-12-10 MED ORDER — HYDROCHLOROTHIAZIDE 25 MG PO TABS: ORAL_TABLET | ORAL | 2 refills | Status: DC

## 2023-12-10 MED ORDER — LISINOPRIL 10 MG PO TABS: ORAL_TABLET | ORAL | 2 refills | Status: AC

## 2023-12-10 MED ORDER — DAPAGLIFLOZIN PROPANEDIOL 10 MG PO TABS: 10.0000 mg | ORAL_TABLET | Freq: Every day | ORAL | 1 refills | Status: AC

## 2023-12-10 MED ORDER — METFORMIN HCL 1000 MG PO TABS: ORAL_TABLET | ORAL | 2 refills | Status: AC

## 2023-12-10 NOTE — Patient Instructions (Signed)
 Sinus congestion certainly could be due to allergies but if you have persistent headaches, and discolored nasal discharge, then I start to think of a sinus infection.  I did print an antibiotic if not improving into next week, or if you want to start that sooner for possible sinus infection.  If any new or worsening symptoms or questions please let me know.  No change in other medications at this time.  If any concerns on labs I will let you know.  Have a happy Thanksgiving.

## 2023-12-10 NOTE — Progress Notes (Signed)
 Subjective:  Patient ID: Stephanie Nunez, female    DOB: [birth year]  Age: 63 y.o. MRN: [id number]  CC:  Chief Complaint  Patient presents with   Diabetes    No questions or concerns. Has not been checking it    HPI Stephanie Nunez presents for   Diabetes: Complicated by microalbuminuria treated with Farxiga  metformin  and Januvia .  Statin with Crestor , ACE inhibitor with lisinopril .  Previously had treated with gabapentin , then off meds when discussed in July, had not needed recently.  Denies any symptoms of mycotic or UTI symptoms with use of SGLT2.  No history of DKA. Home readings -  none No symptomatic lows symptoms.  Microalbumin: Normal ratio 04/30/2023 Optho, foot exam, pneumovax: up to date.  Covid booster and flu vaccine at pharmacy.   Colonoscopy in 2017 - repeat in 10 yrs.  Lab Results  Component Value Date   HGBA1C 6.6 (H) 07/30/2023   HGBA1C 6.7 (H) 04/30/2023   HGBA1C 7.6 (H) 01/13/2023   Lab Results  Component Value Date   MICROALBUR <0.7 04/30/2023   LDLCALC 87 04/30/2023   CREATININE 0.64 07/30/2023   Hyperlipidemia: Crestor  10 mg daily, Niaspan  750 mg 2 tablets at bedtime.  Denies any myalgias flushing or side effects with this combo. Lab Results  Component Value Date   CHOL 157 04/30/2023   HDL 54.20 04/30/2023   LDLCALC 87 04/30/2023   TRIG 82.0 04/30/2023   CHOLHDL 3 04/30/2023   Lab Results  Component Value Date   ALT 13 07/30/2023   AST 22 07/30/2023   ALKPHOS 52 07/30/2023   BILITOT 0.8 07/30/2023   Hypertension: Lisinopril  10 mg daily, potassium supplementation 40 mill equivalents daily, hydrochlorothiazide  25mg  every day.  Home readings:100/80 or so. No low symptoms. BP Readings from Last 3 Encounters:  12/10/23 108/82  07/30/23 118/76  04/30/23 120/80   Lab Results  Component Value Date   CREATININE 0.64 07/30/2023   Nasal congestion Past 3 weeks. No fever. Discolored nasal d/c every morning past week and a half. Allergy meds  - nasal spray and antihistamine, and alleve otc.  Slight HA, no face/tooth pain.    History Patient Active Problem List   Diagnosis Date Noted   Epiphora 01/05/2016   HTN (hypertension) 02/01/2011   Hyperlipemia 02/01/2011   Obesity (BMI 30-39.9) 02/01/2011   DM type 2 (diabetes mellitus, type 2) (HCC) 02/01/2011   Past Medical History:  Diagnosis Date   Allergic rhinitis    Allergy    seasonal   Cervical polyp    History of gestational diabetes mellitus    Hyperlipidemia    no meds   Hypertension    Type 2 diabetes mellitus (HCC)    followed by pcp---  (11-08-2020  pt stated check blood sugar once weekly, fasting sugar -- 145)   Uterine fibroid    Wears glasses    Past Surgical History:  Procedure Laterality Date   COLONOSCOPY  12/06/2015   by stark   DILATATION & CURETTAGE/HYSTEROSCOPY WITH MYOSURE N/A 11/10/2020   Procedure: DILATATION & CURETTAGE/HYSTEROSCOPY WITH  MYOSURE;  Surgeon: Curlene Agent, MD;  Location: [facility] ;  Service: Gynecology;  Laterality: N/A;   LIPOMA EXCISION  1990   Left chest wall   LUMBAR SPINE SURGERY  1992   L4-5   SPINE SURGERY  1992   herniated disks; L4 &5   Allergies  Allergen Reactions   Other Other (See Comments)    Product containing 3-hydroxy-3-methylglutaryl-coenzyme A reductase inhibitor (  product)   Simvastatin     myalgia   Statins     Severe musculoskeletal pains.   Prior to Admission medications   Medication Sig Start Date End Date Taking? Authorizing Provider  aspirin 81 MG tablet Take 81 mg by mouth at bedtime.   Yes [provider]  blood glucose meter kit and supplies Dispense based on insurance preference. once daily as directed. E11.9 02/03/19  Yes Levora Reyes SAUNDERS, MD  cholecalciferol (VITAMIN D) 1000 units tablet Take 1,000 Units by mouth daily.   Yes [provider]  dapagliflozin  propanediol (FARXIGA ) 10 MG TABS tablet Take 1 tablet (10 mg total) by mouth daily. 04/30/23  Yes  Levora Reyes SAUNDERS, MD  fluticasone  (FLONASE ) 50 MCG/ACT nasal spray SHAKE LIQUID AND USE 1 TO 2 SPRAYS IN EACH NOSTRIL DAILY 05/08/21  Yes Levora Reyes SAUNDERS, MD  gabapentin  (NEURONTIN ) 100 MG capsule TAKE 1 CAPSULE(100 MG) BY MOUTH EVERY EVENING 01/13/23  Yes Levora Reyes SAUNDERS, MD  hydrochlorothiazide  (HYDRODIURIL ) 25 MG tablet TAKE 1 TABLET(25 MG) BY MOUTH DAILY 04/30/23  Yes Levora Reyes SAUNDERS, MD  lisinopril  (ZESTRIL ) 10 MG tablet TAKE 1 TABLET(10 MG) BY MOUTH DAILY 06/18/23  Yes Levora Reyes SAUNDERS, MD  metFORMIN  (GLUCOPHAGE ) 1000 MG tablet TAKE 1 TABLET(1000 MG) BY MOUTH TWICE DAILY WITH A MEAL 04/30/23  Yes Levora Reyes SAUNDERS, MD  Multiple Vitamin (MULTIVITAMIN) tablet Take 1 tablet by mouth daily.   Yes [provider]  niacin  (NIASPAN ) 750 MG CR tablet TAKE 2 TABLETS(1500 MG) BY MOUTH AT BEDTIME 04/30/23  Yes Levora Reyes SAUNDERS, MD  [facility] ULTRA test strip USE TO TEST AS DIRECTED ONCE DAILY 08/03/19  Yes Levora Reyes SAUNDERS, MD  potassium chloride  (KLOR-CON  M) 10 MEQ tablet TAKE 2 TABLETS(20 MEQ) BY MOUTH DAILY 11/13/23  Yes Levora Reyes SAUNDERS, MD  rosuvastatin  (CRESTOR ) 10 MG tablet Take 1 tablet (10 mg total) by mouth daily. 04/30/23  Yes Levora Reyes SAUNDERS, MD  sitaGLIPtin  (JANUVIA ) 100 MG tablet Take 1 tablet (100 mg total) by mouth daily. 04/30/23  Yes Levora Reyes SAUNDERS, MD  fluconazole  (DIFLUCAN ) 150 MG tablet Take 1 tablet (150 mg total) by mouth daily. Patient not taking: Reported on 12/10/2023 09/06/22   Levora Reyes SAUNDERS, MD   Social History   Socioeconomic History   Marital status: Married    Spouse name: Medford   Number of children: 1   Years of education: 16   Highest education level: Bachelor's degree (e.g., BA, AB, BS)  Occupational History   Occupation: geologist, engineering  Tobacco Use   Smoking status: Never   Smokeless tobacco: Never  Substance and Sexual Activity   Alcohol use: Not Currently    Comment: seldom   Drug use: Never   Sexual activity: Yes    Partners: Male     Birth control/protection: Condom  Other Topics Concern   Not on file  Social History Narrative   Lives with her husband (a teacher, early years/pre) and their daughter.  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, environmental   Social Drivers of Corporate Investment Banker Strain: Low Risk  (04/29/2023)   Overall Financial Resource Strain (CARDIA)    Difficulty of Paying Living Expenses: Not hard at all  Food Insecurity: No Food Insecurity (04/29/2023)   Hunger Vital Sign    Worried About Running Out of Food in the Last Year: Never true    Ran Out of Food in the Last Year: Never true  Transportation Needs:  No Transportation Needs (04/29/2023)   PRAPARE - Administrator, Civil Service (Medical): No    Lack of Transportation (Non-Medical): No  Physical Activity: Unknown (04/29/2023)   Exercise Vital Sign    Days of Exercise per Week: 0 days    Minutes of Exercise per Session: Not on file  Stress: No Stress Concern Present (04/29/2023)   [employer] of Occupational Health - Occupational Stress Questionnaire    Feeling of Stress : Not at all  Social Connections: Moderately Isolated (04/29/2023)   Social Connection and Isolation Panel    Frequency of Communication with Friends and Family: Twice a week    Frequency of Social Gatherings with Friends and Family: Once a week    Attends Religious Services: Never    Database Administrator or Organizations: No    Attends Engineer, Structural: Not on file    Marital Status: Married  Catering Manager Violence: Not on file    Review of Systems  Constitutional:  Negative for fatigue and unexpected weight change.  Respiratory:  Negative for chest tightness and shortness of breath.   Cardiovascular:  Negative for chest pain, palpitations and leg swelling.  Gastrointestinal:  Negative for abdominal pain and blood in stool.  Neurological:  Negative for dizziness, syncope, light-headedness and headaches.      Objective:   Vitals:   12/10/23 1022  BP: 108/82  Pulse: 89  Resp: 20  Temp: 98.6 F (37 C)  TempSrc: Temporal  SpO2: 98%  Weight: 177 lb (80.3 kg)  Height: 5' 2 (1.575 m)     Physical Exam Vitals reviewed.  Constitutional:      General: She is not in acute distress.    Appearance: Normal appearance. She is well-developed.  HENT:     Head: Normocephalic and atraumatic.     Right Ear: Hearing, tympanic membrane, ear canal and external ear normal.     Left Ear: Hearing, tympanic membrane, ear canal and external ear normal.     Nose: Nose normal.     Comments:  No sinus ttp    Mouth/Throat:     Pharynx: No posterior oropharyngeal erythema.  Eyes:     Conjunctiva/sclera: Conjunctivae normal.     Pupils: Pupils are equal, round, and reactive to light.  Neck:     Vascular: No carotid bruit.  Cardiovascular:     Rate and Rhythm: Normal rate and regular rhythm.     Heart sounds: Normal heart sounds. No murmur heard. Pulmonary:     Effort: Pulmonary effort is normal. No respiratory distress.     Breath sounds: Normal breath sounds. No wheezing or rhonchi.  Abdominal:     Palpations: Abdomen is soft. There is no pulsatile mass.     Tenderness: There is no abdominal tenderness.  Musculoskeletal:     Right lower leg: No edema.     Left lower leg: No edema.  Skin:    General: Skin is warm and dry.     Findings: No rash.  Neurological:     Mental Status: She is alert and oriented to person, place, and time.  Psychiatric:        Mood and Affect: Mood normal.        Behavior: Behavior normal.        Assessment & Plan:  Stephanie Nunez is a 63 y.o. female . Acute sinusitis, recurrence not specified, unspecified location - Plan: amoxicillin -clavulanate (AUGMENTIN ) 875-125 MG tablet  - Allergies versus initial viral  syndrome and secondary sinusitis with headache, discolored nasal discharge.  Printed Augmentin  as she would like to continue home treatment, allergy  treatment for now but then would consider antibiotics if not improving next few days.  RTC precautions given.  Side effects of antibiotics discussed  Type 2 diabetes mellitus with microalbuminuria, without long-term current use of insulin (HCC) - Plan: Hemoglobin A1c, dapagliflozin  propanediol (FARXIGA ) 10 MG TABS tablet, metFORMIN  (GLUCOPHAGE ) 1000 MG tablet, sitaGLIPtin  (JANUVIA ) 100 MG tablet  - Tolerating current med regimen, check A1c and adjust plan accordingly.  Essential hypertension - Plan: hydrochlorothiazide  (HYDRODIURIL ) 25 MG tablet, lisinopril  (ZESTRIL ) 10 MG tablet  - Check labs and adjust plan accordingly.  Tolerating current dosing.  Blood pressure on the lower side but asymptomatic.  Diastolic borderline elevated.  No changes for now.    RTC precautions.  Hyperlipidemia, unspecified hyperlipidemia type - Plan: Comprehensive metabolic panel with GFR, Lipid panel, niacin  (NIASPAN ) 750 MG CR tablet, rosuvastatin  (CRESTOR ) 10 MG tablet  - Check lipid panel, tolerating current combination of Niaspan  and Crestor , continue same  Hypokalemia  - Check CMP, continue supplementation.  Meds ordered this encounter  Medications   amoxicillin -clavulanate (AUGMENTIN ) 875-125 MG tablet    Sig: Take 1 tablet by mouth 2 (two) times daily.    Dispense:  20 tablet    Refill:  0   dapagliflozin  propanediol (FARXIGA ) 10 MG TABS tablet    Sig: Take 1 tablet (10 mg total) by mouth daily.    Dispense:  90 tablet    Refill:  1   hydrochlorothiazide  (HYDRODIURIL ) 25 MG tablet    Sig: TAKE 1 TABLET(25 MG) BY MOUTH DAILY    Dispense:  90 tablet    Refill:  2   lisinopril  (ZESTRIL ) 10 MG tablet    Sig: TAKE 1 TABLET(10 MG) BY MOUTH DAILY    Dispense:  90 tablet    Refill:  2   metFORMIN  (GLUCOPHAGE ) 1000 MG tablet    Sig: TAKE 1 TABLET(1000 MG) BY MOUTH TWICE DAILY WITH A MEAL    Dispense:  180 tablet    Refill:  2   niacin  (NIASPAN ) 750 MG CR tablet    Sig: TAKE 2 TABLETS(1500 MG) BY MOUTH  AT BEDTIME    Dispense:  180 tablet    Refill:  1   rosuvastatin  (CRESTOR ) 10 MG tablet    Sig: Take 1 tablet (10 mg total) by mouth daily.    Dispense:  90 tablet    Refill:  1    **Patient requests 90 days supply**   sitaGLIPtin  (JANUVIA ) 100 MG tablet    Sig: Take 1 tablet (100 mg total) by mouth daily.    Dispense:  90 tablet    Refill:  1   Patient Instructions  Sinus congestion certainly could be due to allergies but if you have persistent headaches, and discolored nasal discharge, then I start to think of a sinus infection.  I did print an antibiotic if not improving into next week, or if you want to start that sooner for possible sinus infection.  If any new or worsening symptoms or questions please let me know.  No change in other medications at this time.  If any concerns on labs I will let you know.  Have a happy Thanksgiving.    Signed,   Reyes Pines, MD [facility], [facility] 12/10/23 11:12 AM

## 2023-12-14 ENCOUNTER — Ambulatory Visit: Payer: Self-pay | Admitting: Family Medicine

## 2023-12-22 ENCOUNTER — Ambulatory Visit: Payer: Self-pay

## 2023-12-22 NOTE — Telephone Encounter (Signed)
 Provided patient with lab result and note by provider. All questions answered. No follow up requested. Patient will call back if she has additional questions.    Copied from CRM #[id number]. Topic: Clinical - Lab/Test Results >> Dec 22, 2023 12:55 PM Mercedes MATSU wrote: Reason for CRM: Patient relayed results. She is requesting a break down of her LDL numbers. Reason for Disposition  Caller requesting routine or non-urgent lab result  Answer Assessment - Initial Assessment Questions 1. REASON FOR CALL or QUESTION: What is your reason for calling today? or How can I best     Review lab results-reviewed all questions answered 2. CALLER: Document the source of call. (e.g., laboratory staff, caregiver or patient).     Patient.  Protocols used: PCP Call - No Triage-A-AH

## 2023-12-22 NOTE — Telephone Encounter (Signed)
 Looks like everything has been completed

## 2023-12-22 NOTE — Progress Notes (Signed)
 Called patient and left vm in regards to lab results. If patient calls back, please relay results

## 2023-12-23 NOTE — Telephone Encounter (Unsigned)
 Copied from CRM #[id number]. Topic: Clinical - Lab/Test Results >> Dec 22, 2023 12:55 PM Mercedes MATSU wrote: Reason for CRM: Patient relayed results. She is requesting a break down of her LDL numbers. >> Dec 23, 2023 12:42 PM Franky GRADE wrote: Patient is returning a call she received from [facility], advised it was regarding lab results. Patient states someone already gave her the results and she did not have any questions or concerns but thanks for following up.

## 2023-12-23 NOTE — Progress Notes (Signed)
 Patient was called about lab results but it is not documented. She verbalized understanding of labs with that person. Closing thread

## 2023-12-23 NOTE — Progress Notes (Signed)
 Called patient and left vm in regards to lab results. If patient calls back, please relay results

## 2024-01-28 ENCOUNTER — Other Ambulatory Visit: Payer: Self-pay | Admitting: Family Medicine

## 2024-01-28 DIAGNOSIS — E1129 Type 2 diabetes mellitus with other diabetic kidney complication: Secondary | ICD-10-CM

## 2024-01-30 ENCOUNTER — Other Ambulatory Visit: Payer: Self-pay | Admitting: Family Medicine

## 2024-01-30 DIAGNOSIS — I10 Essential (primary) hypertension: Secondary | ICD-10-CM

## 2024-02-13 ENCOUNTER — Telehealth: Payer: Self-pay

## 2024-02-13 DIAGNOSIS — E876 Hypokalemia: Secondary | ICD-10-CM

## 2024-02-13 MED ORDER — POTASSIUM CHLORIDE CRYS ER 10 MEQ PO TBCR: EXTENDED_RELEASE_TABLET | ORAL | 1 refills | Status: AC

## 2024-02-13 NOTE — Telephone Encounter (Addendum)
 Patient due for refill on Potassium, signed this encounter, patient mentions an increase but per last lab work she is to continue the Potassium 20 mEq in addition to OTC potassium or potassium rich foods.  Called patient to inform her the refill has been sent, discussed the addition of OTC potassium not additional Rx strength at this time. Patient voiced understanding and will pick up refill later today

## 2024-02-13 NOTE — Telephone Encounter (Signed)
 Cholesterol levels were normal. Potassium borderline low at 3.4.  Other electrolytes were overall stable. 25-month blood sugar test has improved to 6.4.  Keep up the good work. It may be worth taking additional dose of potassium daily or acid rich foods. Let me know if you have questions.   Dr. Levora   Medication okay to be refilled?

## 2024-02-13 NOTE — Telephone Encounter (Signed)
 Copied from CRM [id number]. Topic: Clinical - Medication Refill >> Feb 13, 2024  2:29 PM Taleah C wrote: Medication: potassium (Pt states per pcp, its supposed to be an increase.) Has the patient contacted their pharmacy? Yes They told her to contact the office  This is the patient's preferred pharmacy:  [id number] - [place], [place] - [address] AT [address] [place] [address] Phone: [phone] Fax: [phone]  Is this the correct pharmacy for this prescription? Yes If no, delete pharmacy and type the correct one.   Has the prescription been filled recently? No  Is the patient out of the medication? Yes  Has the patient been seen for an appointment in the last year OR does the patient have an upcoming appointment? Yes  Can we respond through MyChart? Yes  Agent: Please be advised that Rx refills may take up to 3 business days. We ask that you follow-up with your pharmacy.

## 2024-02-13 NOTE — Addendum Note (Signed)
 Addended by: Darica Goren K on: 02/13/2024 02:54 PM   Modules accepted: Orders

## 2024-02-14 NOTE — Telephone Encounter (Signed)
 Noted.

## 2024-06-11 ENCOUNTER — Ambulatory Visit: Admitting: Family Medicine
